# Patient Record
Sex: Male | Born: 1990 | Race: Black or African American | Hispanic: No | Marital: Single | Smoking: Current every day smoker
Health system: Southern US, Community
[De-identification: ages and names within clinical notes are randomized; demographics above are authoritative.]

## PROBLEM LIST (undated history)

## (undated) DIAGNOSIS — Z8719 Personal history of other diseases of the digestive system: Secondary | ICD-10-CM

## (undated) DIAGNOSIS — G47419 Narcolepsy without cataplexy: Secondary | ICD-10-CM

## (undated) DIAGNOSIS — E109 Type 1 diabetes mellitus without complications: Secondary | ICD-10-CM

## (undated) DIAGNOSIS — E119 Type 2 diabetes mellitus without complications: Secondary | ICD-10-CM

## (undated) DIAGNOSIS — R569 Unspecified convulsions: Secondary | ICD-10-CM

## (undated) HISTORY — DX: Type 1 diabetes mellitus without complications: E10.9

## (undated) HISTORY — PX: TONSILLECTOMY: SUR1361

## (undated) HISTORY — PX: CIRCUMCISION: SUR203

## (undated) HISTORY — PX: HERNIA REPAIR: SHX51

---
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
## 2011-04-13 LAB — POCT TOXICOLOGY PANEL

## 2011-04-18 ENCOUNTER — Emergency Department (INDEPENDENT_AMBULATORY_CARE_PROVIDER_SITE_OTHER): Payer: Self-pay

## 2011-04-18 ENCOUNTER — Emergency Department (HOSPITAL_BASED_OUTPATIENT_CLINIC_OR_DEPARTMENT_OTHER)
Admission: EM | Admit: 2011-04-18 | Discharge: 2011-04-18 | Discharge status: Against Medical Advice | Payer: Self-pay | Attending: Emergency Medicine | Admitting: Emergency Medicine

## 2011-04-18 ENCOUNTER — Encounter: Payer: Self-pay | Admitting: *Deleted

## 2011-04-18 DIAGNOSIS — X58XXXA Exposure to other specified factors, initial encounter: Secondary | ICD-10-CM

## 2011-04-18 DIAGNOSIS — M79609 Pain in unspecified limb: Secondary | ICD-10-CM

## 2011-04-18 DIAGNOSIS — S6990XA Unspecified injury of unspecified wrist, hand and finger(s), initial encounter: Secondary | ICD-10-CM

## 2011-04-18 DIAGNOSIS — M25539 Pain in unspecified wrist: Secondary | ICD-10-CM

## 2011-04-18 HISTORY — DX: Unspecified convulsions: R56.9
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
## 2012-05-03 IMAGING — CR DG CHEST 2V
2 series · 2 of 2 positions shown · non-contrast
Comparison: PA and lateral chest 04/25/2009.

CLINICAL DATA: Chest pain.  Hemoptysis.

CHEST - 2 VIEW

[w chest pa]
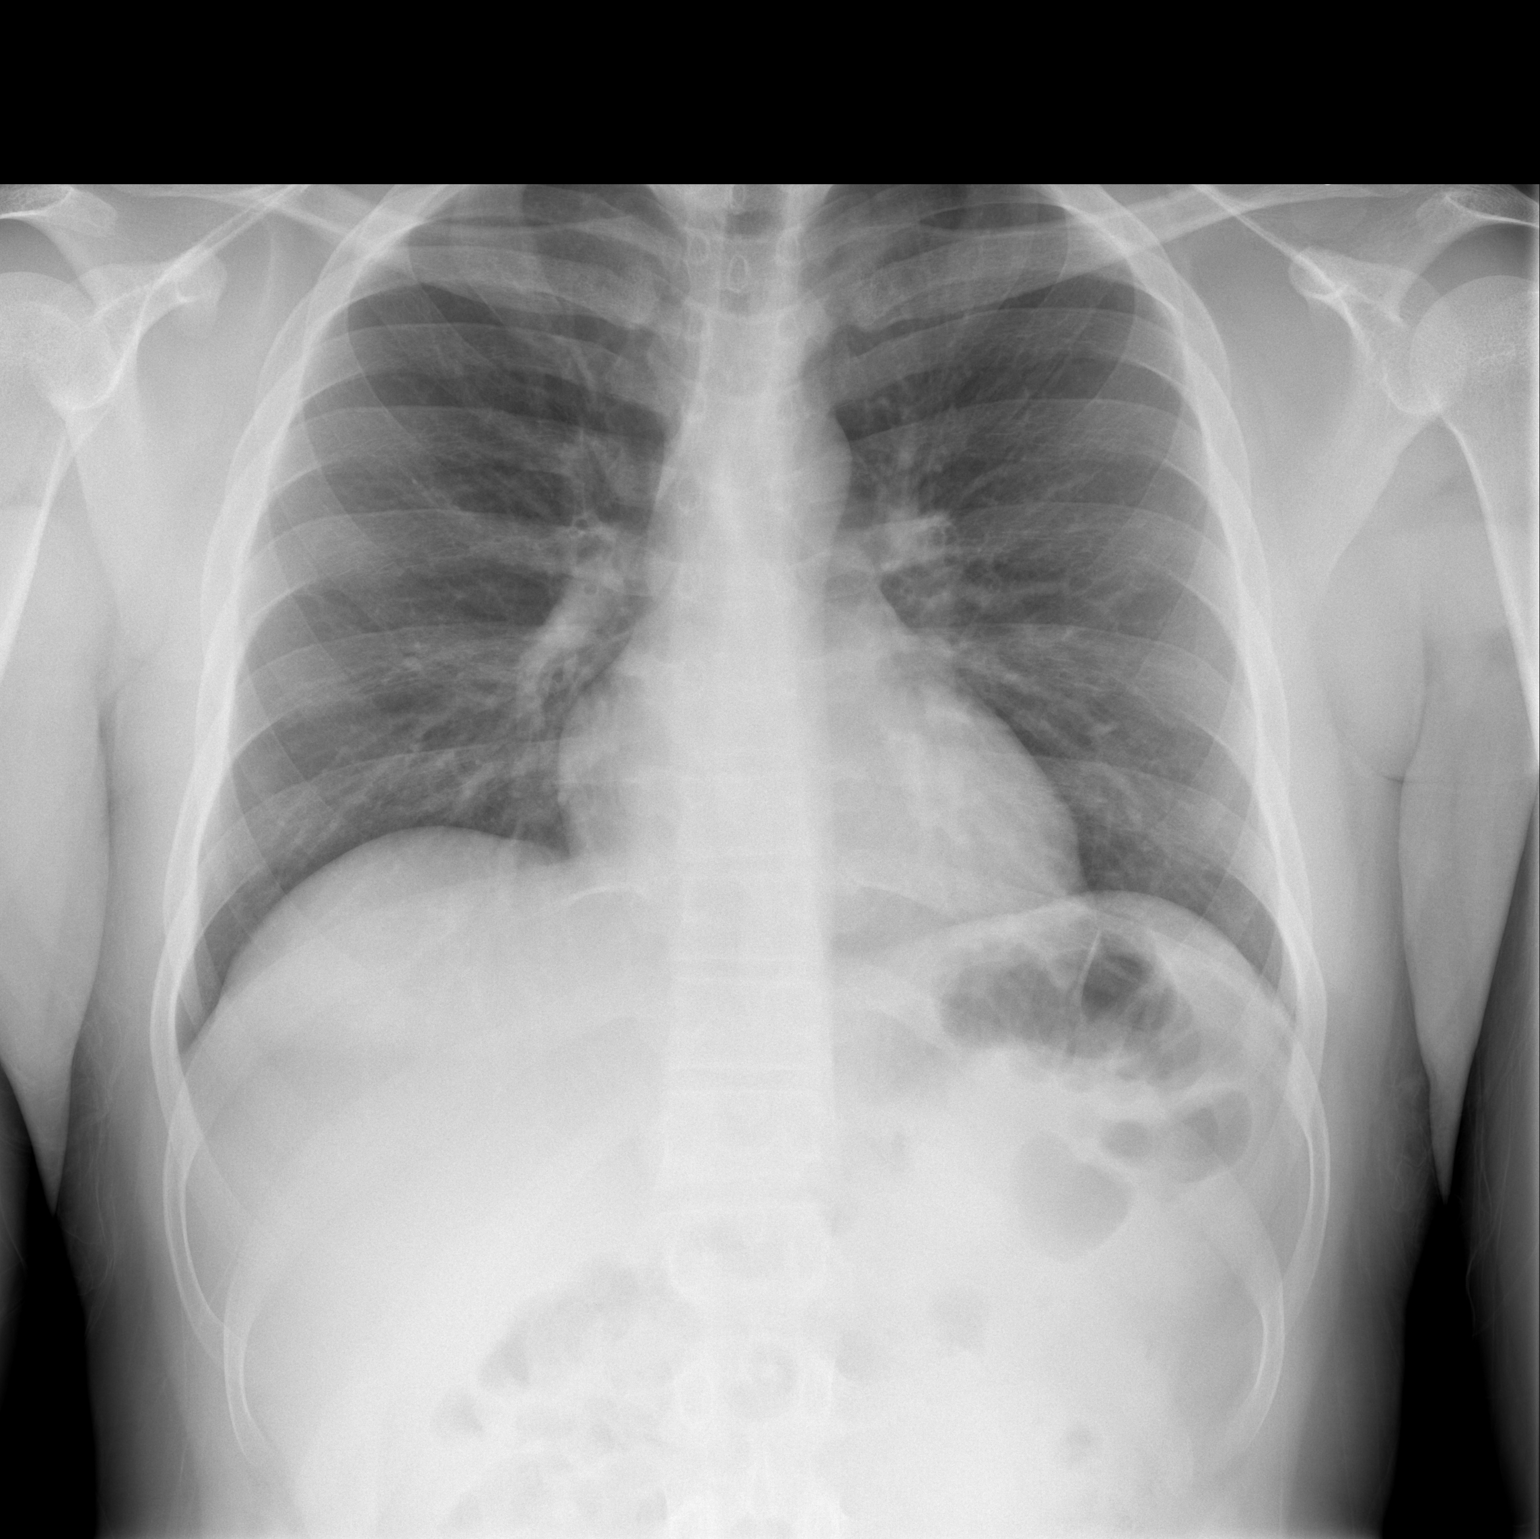

[w chest lat]
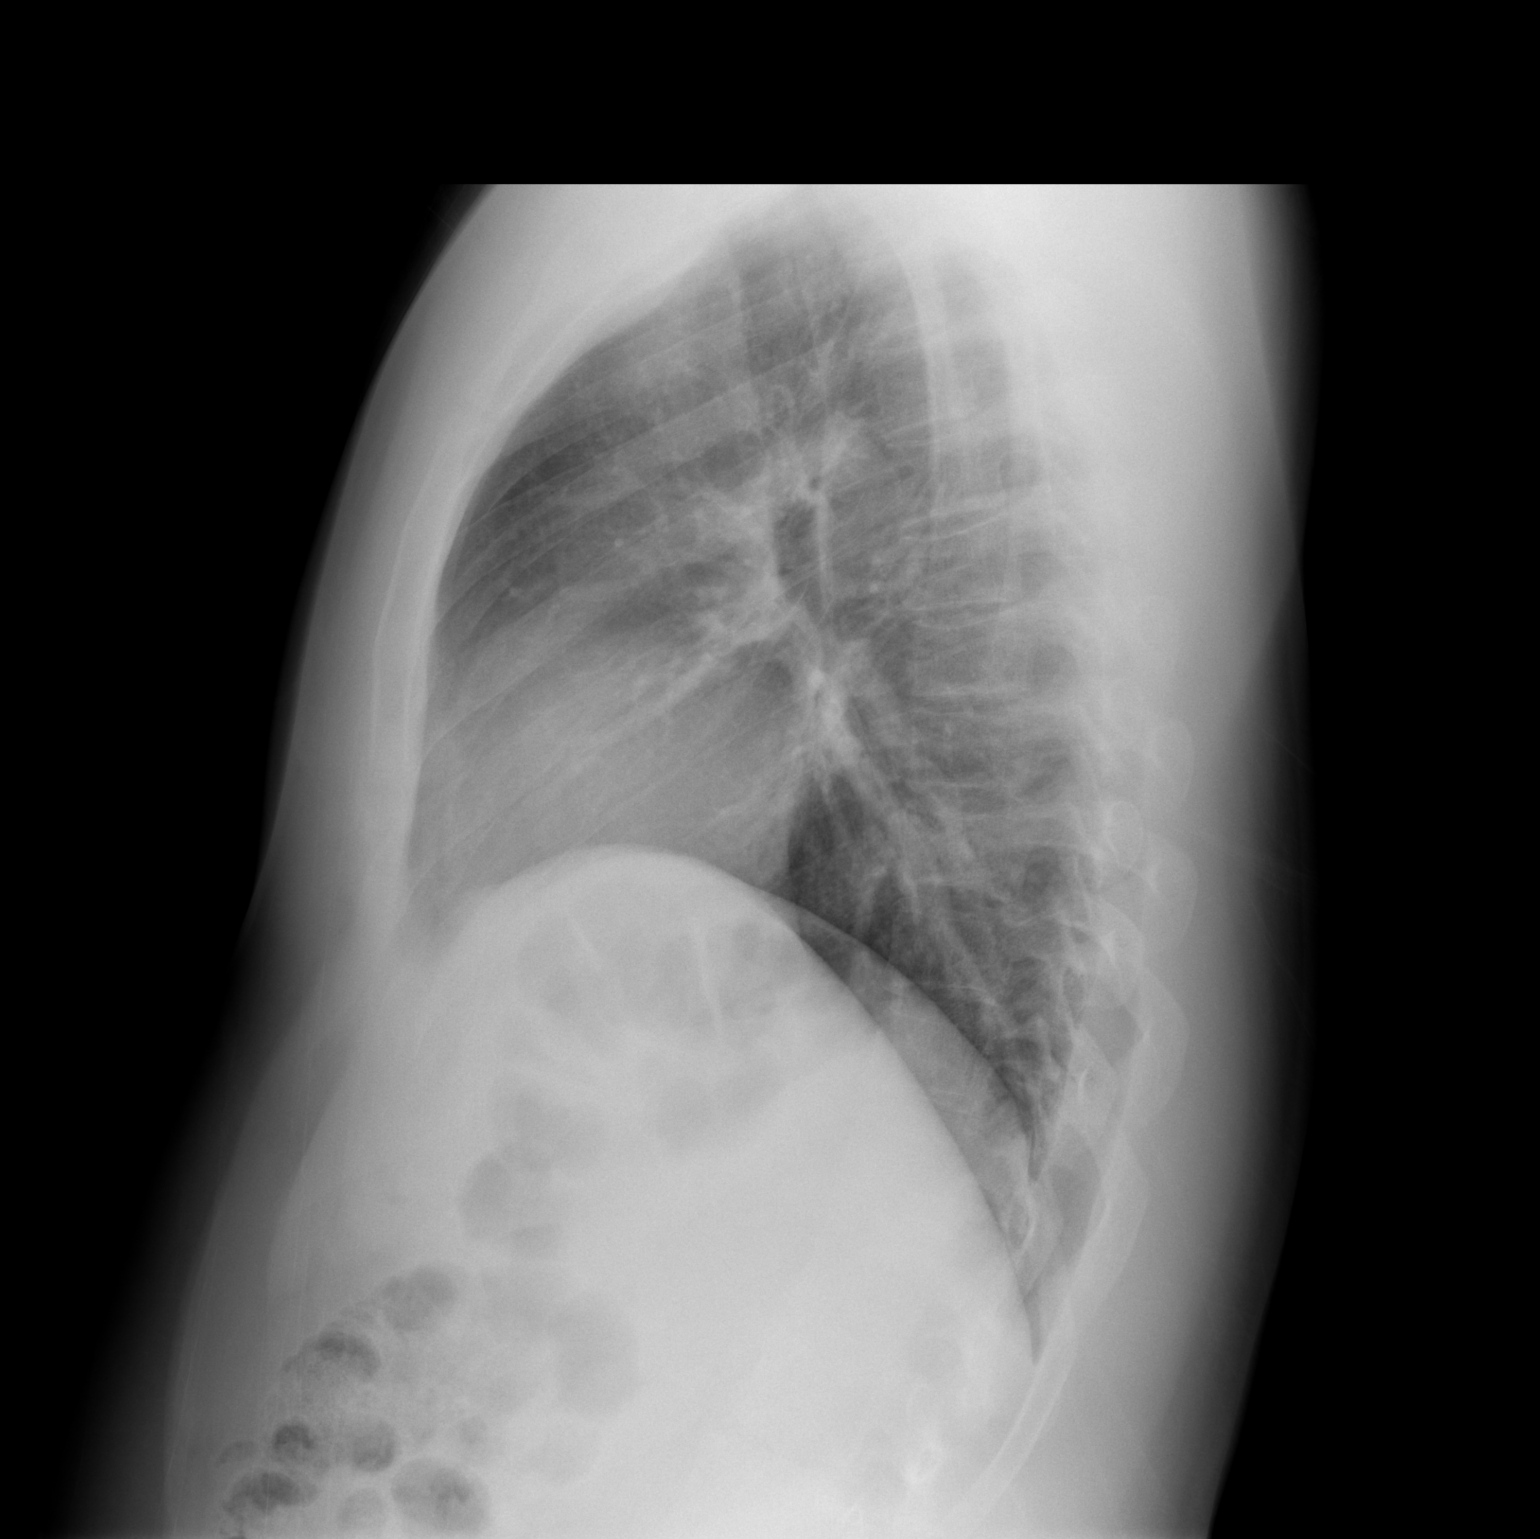

[2 of 2 positions shown; findings below may reference images not displayed]

FINDINGS: Lungs are clear.  Heart size is normal.  No pneumothorax
or pleural effusion.  No focal bony abnormality.
IMPRESSION: No acute disease.

## 2012-05-05 IMAGING — CR DG CHEST 2V
2 series · 2 of 2 positions shown · non-contrast
Comparison: Chest radiograph 11/06/2010

CLINICAL DATA: Sore throat, bronchitis

CHEST - 2 VIEW

[w chest pa]
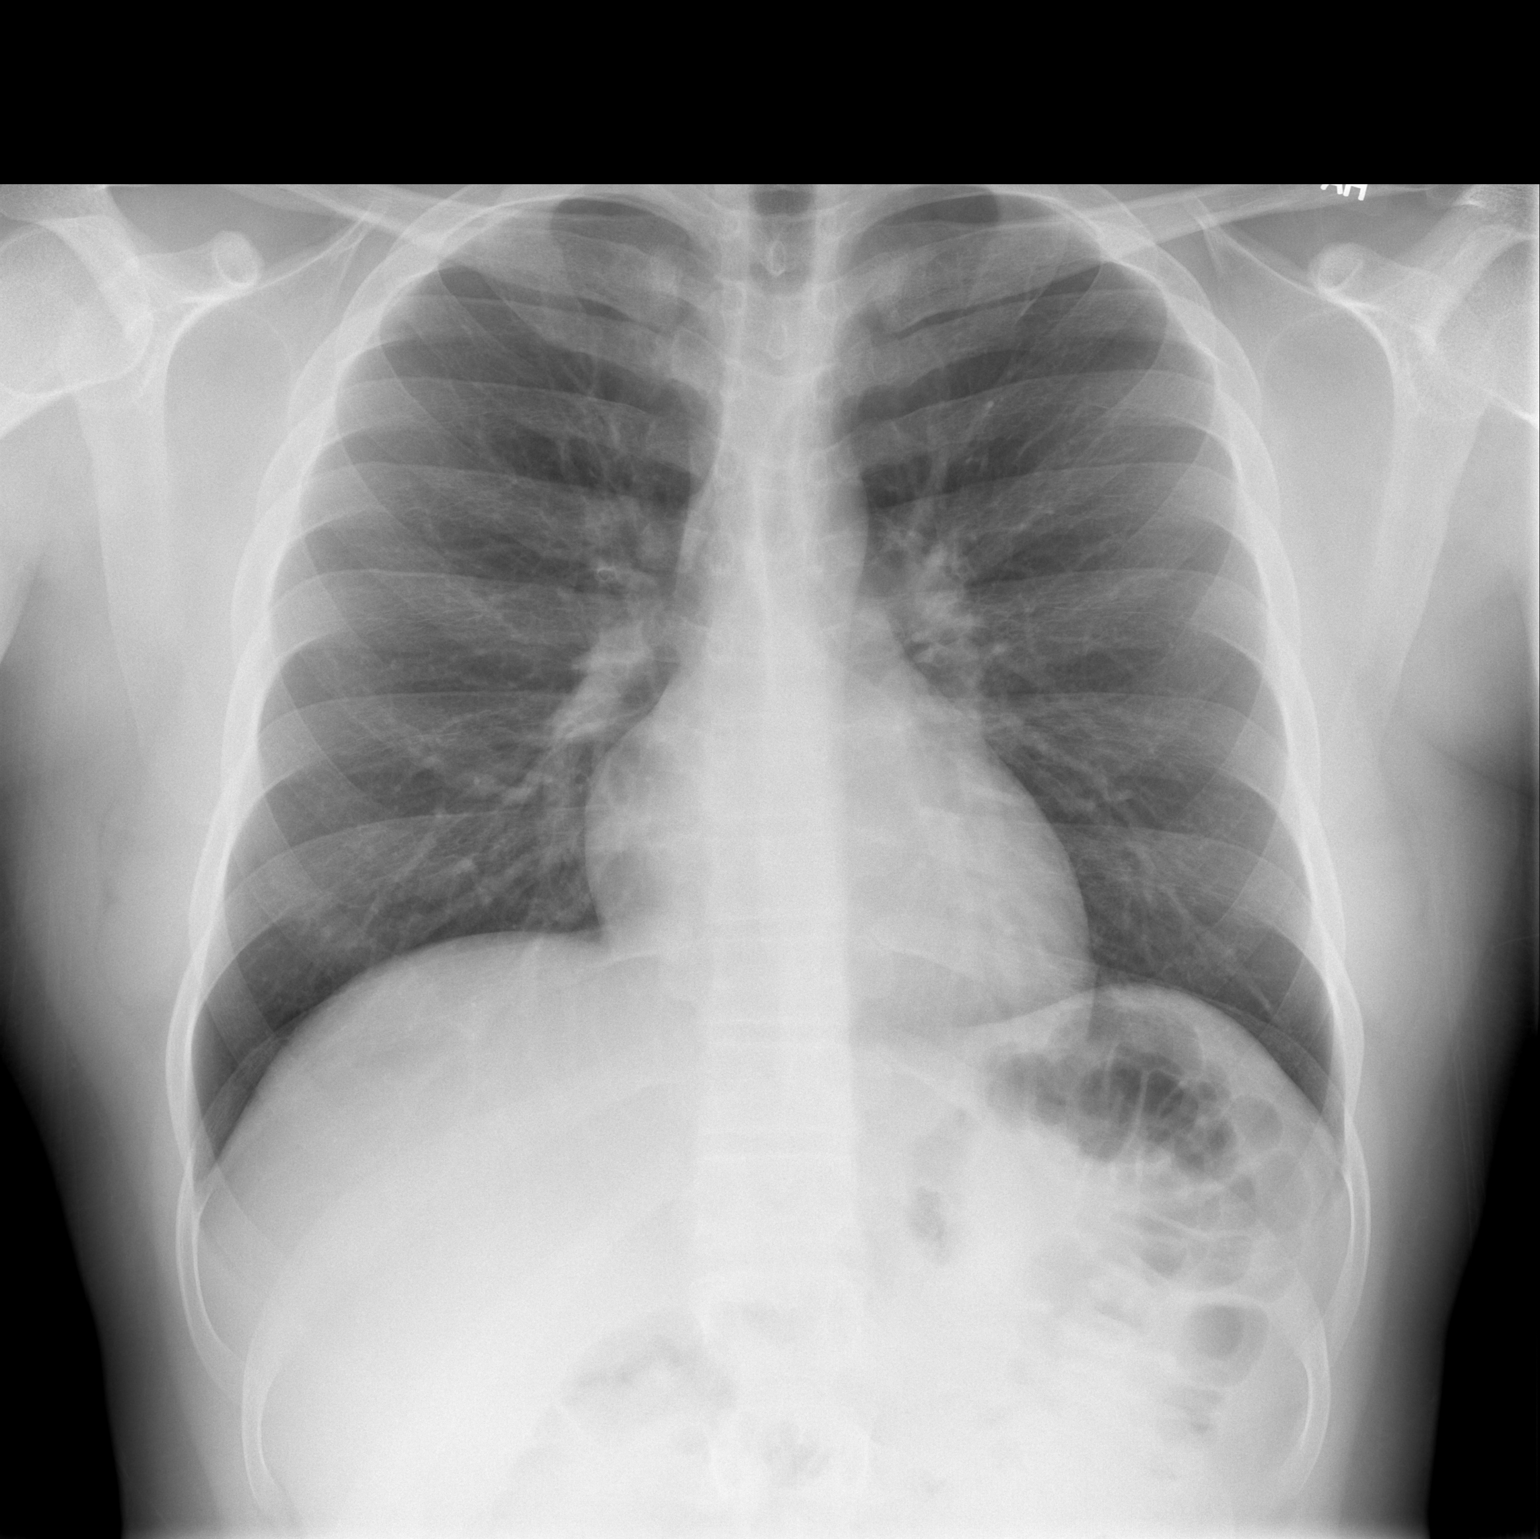

[w chest lat]
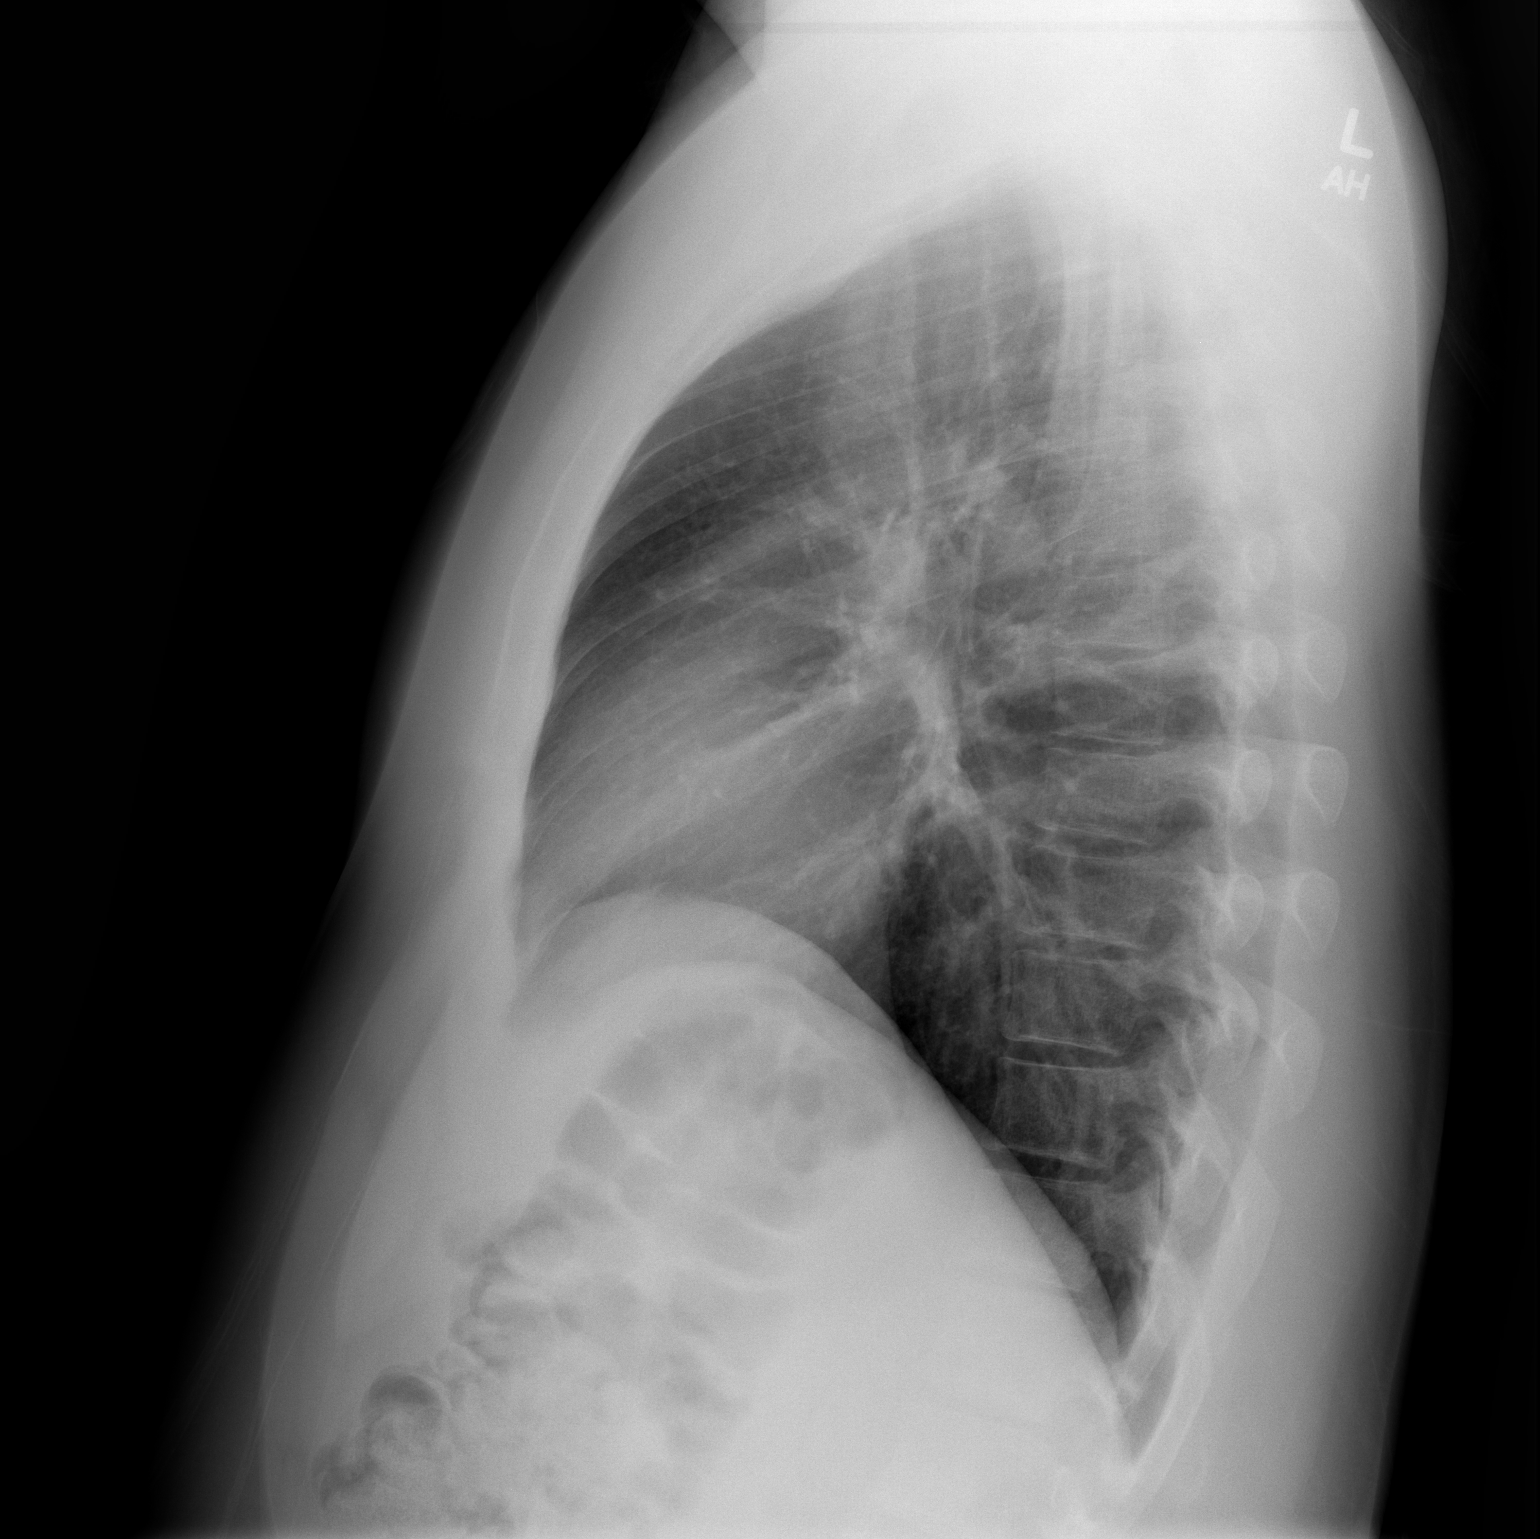

[2 of 2 positions shown; findings below may reference images not displayed]

FINDINGS: Normal mediastinum and heart silhouette.  Costophrenic
angles are clear.  No evidence effusion, infiltrate, or
pneumothorax.  There is mild coarseness of the central
bronchovascular markings which is not significant change from
prior.
IMPRESSION: Mild coarsened central bronchovascular markings suggest viral
process or bronchitis. No significant change.

## 2012-11-17 ENCOUNTER — Emergency Department (HOSPITAL_BASED_OUTPATIENT_CLINIC_OR_DEPARTMENT_OTHER)
Admission: EM | Admit: 2012-11-17 | Discharge: 2012-11-17 | Disposition: A | Discharge status: Home / Self Care | Payer: Self-pay | Attending: Emergency Medicine | Admitting: Emergency Medicine

## 2012-11-17 ENCOUNTER — Encounter (HOSPITAL_BASED_OUTPATIENT_CLINIC_OR_DEPARTMENT_OTHER): Payer: Self-pay | Admitting: Emergency Medicine

## 2012-11-17 DIAGNOSIS — J45909 Unspecified asthma, uncomplicated: Secondary | ICD-10-CM | POA: Insufficient documentation

## 2012-11-17 DIAGNOSIS — R0982 Postnasal drip: Secondary | ICD-10-CM | POA: Insufficient documentation

## 2012-11-17 DIAGNOSIS — Z8669 Personal history of other diseases of the nervous system and sense organs: Secondary | ICD-10-CM | POA: Insufficient documentation

## 2012-11-17 DIAGNOSIS — H579 Unspecified disorder of eye and adnexa: Secondary | ICD-10-CM | POA: Insufficient documentation

## 2012-11-17 DIAGNOSIS — R6889 Other general symptoms and signs: Secondary | ICD-10-CM | POA: Insufficient documentation

## 2012-11-17 DIAGNOSIS — J309 Allergic rhinitis, unspecified: Secondary | ICD-10-CM | POA: Insufficient documentation

## 2012-11-17 DIAGNOSIS — Z79899 Other long term (current) drug therapy: Secondary | ICD-10-CM | POA: Insufficient documentation

## 2012-11-17 DIAGNOSIS — F17200 Nicotine dependence, unspecified, uncomplicated: Secondary | ICD-10-CM | POA: Insufficient documentation

## 2012-11-17 MED ORDER — FEXOFENADINE HCL 60 MG PO TABS: 60.0000 mg | ORAL_TABLET | Freq: Two times a day (BID) | ORAL | Status: DC

## 2012-11-17 MED ORDER — PREDNISONE 10 MG PO TABS: 40.0000 mg | ORAL_TABLET | Freq: Every day | ORAL | Status: DC

## 2012-11-17 NOTE — ED Provider Notes (Signed)
History     CSN: [id number]  Arrival date & time 11/17/12  1925   First MD Initiated Contact with Patient 11/17/12 1958      Chief Complaint  Patient presents with  . Allergies    (Consider location/radiation/quality/duration/timing/severity/associated sxs/prior treatment) HPI Comments: Patient presents with a one-week history of runny nose and sinus congestion. He states she's been sneezing a lot and his throat feels itchy. He also has itchy eyes. He has some sinus pressure in his face. He denies any nausea or vomiting. He denies any cough or chest congestion. He denies any fevers or chills. He took one dose of clear and that someone gave him today but hasn't taken any other medications for the symptoms. He denies a past history of seasonal allergies.   Past Medical History  Diagnosis Date  . Asthma   . Seizures     Past Surgical History  Procedure Laterality Date  . Hernia repair    . Tonsillectomy    . Circumcision      History reviewed. No pertinent family history.  History  Substance Use Topics  . Smoking status: Current Every Day Smoker -- 0.00 packs/day  . Smokeless tobacco: Not on file  . Alcohol Use: No      Review of Systems  Constitutional: Negative for fever, chills, diaphoresis and fatigue.  HENT: Positive for congestion, rhinorrhea, sneezing and postnasal drip. Negative for sore throat, trouble swallowing and voice change.   Eyes: Positive for itching. Negative for pain, discharge, redness and visual disturbance.  Respiratory: Negative for cough, chest tightness and shortness of breath.   Cardiovascular: Negative for chest pain and leg swelling.  Gastrointestinal: Negative for nausea, vomiting, abdominal pain, diarrhea and blood in stool.  Genitourinary: Negative for frequency, hematuria, flank pain and difficulty urinating.  Musculoskeletal: Negative for back pain and arthralgias.  Skin: Negative for rash.  Neurological: Negative for dizziness, speech  difficulty, weakness, numbness and headaches.    Allergies  Robitussin  Home Medications   Current Outpatient Rx  Name  Route  Sig  Dispense  Refill  . albuterol (PROVENTIL) (2.5 MG/3ML) 0.083% nebulizer solution   Nebulization   Take 2.5 mg by nebulization every 6 (six) hours as needed. For bronchitis          . fexofenadine (ALLEGRA) 60 MG tablet   Oral   Take 1 tablet (60 mg total) by mouth 2 (two) times daily.   30 tablet   0   . predniSONE (DELTASONE) 10 MG tablet   Oral   Take 4 tablets (40 mg total) by mouth daily.   20 tablet   0     BP 140/83  Pulse 97  Temp(Src) 99.4 F (37.4 C) (Oral)  Resp 16  Ht 5\' 11"  (1.803 m)  Wt 235 lb (106.595 kg)  BMI 32.79 kg/m2  SpO2 96%  Physical Exam  Constitutional: He is oriented to person, place, and time. He appears well-developed and well-nourished.  HENT:  Head: Normocephalic and atraumatic.  Right Ear: External ear normal.  Left Ear: External ear normal.  Mouth/Throat: Oropharynx is clear and moist.  Eyes: Pupils are equal, round, and reactive to light.  Neck: Normal range of motion. Neck supple.  Cardiovascular: Normal rate, regular rhythm and normal heart sounds.   Pulmonary/Chest: Effort normal and breath sounds normal. No respiratory distress. He has no wheezes. He has no rales. He exhibits no tenderness.  Abdominal: Soft. Bowel sounds are normal. There is no tenderness. There is no  rebound and no guarding.  Musculoskeletal: Normal range of motion. He exhibits no edema.  Lymphadenopathy:    He has no cervical adenopathy.  Neurological: He is alert and oriented to person, place, and time.  Skin: Skin is warm and dry. No rash noted.  Psychiatric: He has a normal mood and affect.    ED Course  Procedures (including critical care time)  Labs Reviewed - No data to display No results found.   1. Allergic rhinitis       MDM  Patient with likely seasonal allergies. I gave her a prescription for Allegra  and a five-day course of prednisone. He did ask me about his nose. He has a previously fractured nose several years ago and since then he feels like it's crooked and he's had problems breathing out of his left nares. I will give him a referral to ENT regarding this.        Rolan Bucco, MD 11/17/12 2036

## 2012-11-17 NOTE — ED Notes (Signed)
Onset runny nose, itchy throat, itchy eyes beginning of this week. Pt states feels like something in eyes. Took Claritin, didn't help. Never had to take medicine for allergies in past.

## 2013-01-09 ENCOUNTER — Emergency Department (HOSPITAL_BASED_OUTPATIENT_CLINIC_OR_DEPARTMENT_OTHER): Payer: Self-pay

## 2013-01-09 ENCOUNTER — Emergency Department (HOSPITAL_BASED_OUTPATIENT_CLINIC_OR_DEPARTMENT_OTHER)
Admission: EM | Admit: 2013-01-09 | Discharge: 2013-01-09 | Disposition: A | Discharge status: Home / Self Care | Payer: Self-pay | Attending: Emergency Medicine | Admitting: Emergency Medicine

## 2013-01-09 ENCOUNTER — Encounter (HOSPITAL_BASED_OUTPATIENT_CLINIC_OR_DEPARTMENT_OTHER): Payer: Self-pay

## 2013-01-09 DIAGNOSIS — F17200 Nicotine dependence, unspecified, uncomplicated: Secondary | ICD-10-CM | POA: Insufficient documentation

## 2013-01-09 DIAGNOSIS — J45909 Unspecified asthma, uncomplicated: Secondary | ICD-10-CM | POA: Insufficient documentation

## 2013-01-09 DIAGNOSIS — J029 Acute pharyngitis, unspecified: Secondary | ICD-10-CM | POA: Insufficient documentation

## 2013-01-09 DIAGNOSIS — H5710 Ocular pain, unspecified eye: Secondary | ICD-10-CM | POA: Insufficient documentation

## 2013-01-09 DIAGNOSIS — R51 Headache: Secondary | ICD-10-CM | POA: Insufficient documentation

## 2013-01-09 DIAGNOSIS — R110 Nausea: Secondary | ICD-10-CM | POA: Insufficient documentation

## 2013-01-09 DIAGNOSIS — Z8669 Personal history of other diseases of the nervous system and sense organs: Secondary | ICD-10-CM | POA: Insufficient documentation

## 2013-01-09 MED ORDER — ONDANSETRON HCL 4 MG/2ML IJ SOLN
4.0000 mg | Freq: Once | INTRAMUSCULAR | Status: AC
  Administered 2013-01-09: 4 mg via INTRAVENOUS
  Filled 2013-01-09: qty 2

## 2013-01-09 MED ORDER — KETOROLAC TROMETHAMINE 30 MG/ML IJ SOLN
30.0000 mg | Freq: Once | INTRAMUSCULAR | Status: AC
  Administered 2013-01-09: 30 mg via INTRAVENOUS
  Filled 2013-01-09: qty 1

## 2013-01-09 MED ORDER — DIPHENHYDRAMINE HCL 50 MG/ML IJ SOLN
25.0000 mg | Freq: Once | INTRAMUSCULAR | Status: AC
  Administered 2013-01-09: 25 mg via INTRAVENOUS
  Filled 2013-01-09: qty 1

## 2013-01-09 MED ORDER — SODIUM CHLORIDE 0.9 % IV BOLUS (SEPSIS)
1000.0000 mL | Freq: Once | INTRAVENOUS | Status: AC
  Administered 2013-01-09: 1000 mL via INTRAVENOUS

## 2013-01-09 MED ORDER — METOCLOPRAMIDE HCL 5 MG/ML IJ SOLN
10.0000 mg | Freq: Once | INTRAMUSCULAR | Status: AC
  Administered 2013-01-09: 10 mg via INTRAVENOUS
  Filled 2013-01-09: qty 2

## 2013-01-09 NOTE — ED Notes (Signed)
C/o HA x 3 days 

## 2013-01-09 NOTE — ED Provider Notes (Signed)
History  This chart was scribed for Glynn Octave, MD by Ardelia Mems, ED Scribe. This patient was seen in room MH08/MH08 and the patient's care was started at 9:57 PM.  CSN: [id number]  Arrival date & time 01/09/13  2052    Chief Complaint  Patient presents with  . Headache    The history is provided by the patient. No language interpreter was used.   HPI Comments: Jerome Murphy is a 22 y.o. Male with a hx of asthma and seizures who presents to the Emergency Department complaining of gradual onset, gradually worsening, constant, moderate generalized headache of 3 days duration. Pt states that when he stands up his pain becomes worse and he reports feeling pressure in his head and face. Pt reports associated sore throat and eye pain, but denies visual disturbance. Pt states that he feels like he was punched in the face. Pt denies recent injury or falls. Pt states that he has a hx of seizures, but that he is not compliant with his seizure medication and he hasn't had a seizure in years. Pt states that he does not get headaches often. Pt states that he has taken Aleve Migraine without relief. Pt states that he was here about a month ago with sore throat and allergy symptoms. Pt denies fever, rhinorrhea, vomiting, weakness, numbness, tingling or any other symptoms.   PCP- None  Past Medical History  Diagnosis Date  . Asthma   . Seizures    Past Surgical History  Procedure Laterality Date  . Hernia repair    . Tonsillectomy    . Circumcision     No family history on file. History  Substance Use Topics  . Smoking status: Current Every Day Smoker -- 0.00 packs/day  . Smokeless tobacco: Not on file  . Alcohol Use: No    Review of Systems  Constitutional: Negative for fever and chills.  HENT: Positive for sore throat.   Eyes: Positive for pain. Negative for visual disturbance.  Respiratory: Negative for cough and shortness of breath.   Cardiovascular: Negative for chest  pain.  Gastrointestinal: Negative for nausea, vomiting, abdominal pain and diarrhea.  Genitourinary: Negative for dysuria.  Musculoskeletal: Negative for back pain.  Skin: Negative for rash.  Neurological: Positive for headaches.  Psychiatric/Behavioral: Negative for confusion.  A complete 10 system review of systems was obtained and all systems are negative except as noted in the HPI and PMH.   Allergies  Robitussin  Home Medications   Current Outpatient Rx  Name  Route  Sig  Dispense  Refill  . albuterol (PROVENTIL) (2.5 MG/3ML) 0.083% nebulizer solution   Nebulization   Take 2.5 mg by nebulization every 6 (six) hours as needed. For bronchitis          . fexofenadine (ALLEGRA) 60 MG tablet   Oral   Take 1 tablet (60 mg total) by mouth 2 (two) times daily.   30 tablet   0   . predniSONE (DELTASONE) 10 MG tablet   Oral   Take 4 tablets (40 mg total) by mouth daily.   20 tablet   0    Triage Vitals: BP 134/79  Pulse 94  Temp(Src) 98.1 F (36.7 C) (Oral)  Resp 20  Ht 5\' 11"  (1.803 m)  Wt 230 lb (104.327 kg)  BMI 32.09 kg/m2  SpO2 100%  Physical Exam  Nursing note and vitals reviewed. Constitutional: He is oriented to person, place, and time. He appears well-developed and well-nourished. No distress.  HENT:  Head: Normocephalic and atraumatic.  Mouth/Throat: Oropharynx is clear and moist. No oropharyngeal exudate.  No meningismus. No sinus tenderness.  Eyes: EOM are normal.  Neck: Normal range of motion. Neck supple. No tracheal deviation present.  Cardiovascular: Normal rate.   Pulmonary/Chest: Effort normal. No respiratory distress.  Abdominal: There is no tenderness. There is no rebound and no guarding.  Musculoskeletal: Normal range of motion. He exhibits no edema and no tenderness.  Neurological: He is alert and oriented to person, place, and time. No cranial nerve deficit. He exhibits normal muscle tone. Coordination normal.  No ataxia on finger to nose,  5/5 strength throughout. CN 2-12 intact, no ataxia on finger to nose, no nystagmus, 5/5 strength throughout, no pronator drift, Romberg negative, normal gait.   Skin: Skin is warm and dry.  Psychiatric: He has a normal mood and affect. His behavior is normal.    ED Course  Procedures (including critical care time)  DIAGNOSTIC STUDIES: Oxygen Saturation is 100% on RA, normal by my interpretation.    COORDINATION OF CARE: 10:02 PM- Pt advised of plan for treatment and pt agrees.  Medications  sodium chloride 0.9 % bolus 1,000 mL (1,000 mLs Intravenous New Bag/Given 01/09/13 2208)  ondansetron (ZOFRAN) injection 4 mg (4 mg Intravenous Given 01/09/13 2209)  ketorolac (TORADOL) 30 MG/ML injection 30 mg (30 mg Intravenous Given 01/09/13 2209)  metoCLOPramide (REGLAN) injection 10 mg (10 mg Intravenous Given 01/09/13 2209)  diphenhydrAMINE (BENADRYL) injection 25 mg (25 mg Intravenous Given 01/09/13 2209)    Labs Reviewed - No data to display  Ct Head Wo Contrast  01/09/2013   *RADIOLOGY REPORT*  Clinical Data: Severe headache  CT HEAD WITHOUT CONTRAST  Technique:  Contiguous axial images were obtained from the base of the skull through the vertex without contrast.  Comparison: None.  Findings: There is no acute intracranial hemorrhage or infarct.  No midline shift or mass lesion.  CSF containing spaces are normal. No extra-axial fluid collection.  Calvarium is intact.  Scattered mucosal thickening is present within the ethmoidal air cells.  Mastoid air cells well pneumatized.  IMPRESSION:  Normal head CT.  No acute intracranial process.   Original Report Authenticated By: Rise Mu, M.D.    No diagnosis found.  MDM  Patient complains of gradual onset frontal headache for the past 3 days. Associated with nausea. It is worse with bending forward. Denies any trauma. Remote history of seizures as a child.  Nonfocal neurological exam. No red flags. Not sudden onset. No meningismus, no  neuro deficits. Doubt SAH or meningitis. Patient is given IV fluids, Toradol, Reglan, Benadryl  Pain has resolved with headache cocktail. Feels better.  Will be referred to neurology.        I personally performed the services described in this documentation, which was scribed in my presence. The recorded information has been reviewed and is accurate.    Glynn Octave, MD 01/09/13 2300

## 2013-09-28 ENCOUNTER — Emergency Department (HOSPITAL_BASED_OUTPATIENT_CLINIC_OR_DEPARTMENT_OTHER)
Admission: EM | Admit: 2013-09-28 | Discharge: 2013-09-28 | Disposition: A | Discharge status: Home / Self Care | Payer: No Typology Code available for payment source | Attending: Emergency Medicine | Admitting: Emergency Medicine

## 2013-09-28 ENCOUNTER — Encounter (HOSPITAL_BASED_OUTPATIENT_CLINIC_OR_DEPARTMENT_OTHER): Payer: Self-pay | Admitting: Emergency Medicine

## 2013-09-28 DIAGNOSIS — Y9389 Activity, other specified: Secondary | ICD-10-CM | POA: Insufficient documentation

## 2013-09-28 DIAGNOSIS — IMO0002 Reserved for concepts with insufficient information to code with codable children: Secondary | ICD-10-CM | POA: Insufficient documentation

## 2013-09-28 DIAGNOSIS — G40909 Epilepsy, unspecified, not intractable, without status epilepticus: Secondary | ICD-10-CM | POA: Insufficient documentation

## 2013-09-28 DIAGNOSIS — J45909 Unspecified asthma, uncomplicated: Secondary | ICD-10-CM | POA: Insufficient documentation

## 2013-09-28 DIAGNOSIS — Y92410 Unspecified street and highway as the place of occurrence of the external cause: Secondary | ICD-10-CM | POA: Insufficient documentation

## 2013-09-28 DIAGNOSIS — Z79899 Other long term (current) drug therapy: Secondary | ICD-10-CM | POA: Insufficient documentation

## 2013-09-28 DIAGNOSIS — F17200 Nicotine dependence, unspecified, uncomplicated: Secondary | ICD-10-CM | POA: Insufficient documentation

## 2013-09-28 MED ORDER — IBUPROFEN 600 MG PO TABS: 600.0000 mg | ORAL_TABLET | Freq: Three times a day (TID) | ORAL | Status: AC

## 2013-09-28 MED ORDER — DIAZEPAM 5 MG PO TABS
5.0000 mg | ORAL_TABLET | Freq: Two times a day (BID) | ORAL | Status: AC
Start: 2013-09-28 — End: 2013-10-02

## 2013-09-28 MED ORDER — TRAMADOL HCL 50 MG PO TABS: 50.0000 mg | ORAL_TABLET | Freq: Four times a day (QID) | ORAL | Status: DC | PRN
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
## 2015-02-15 ENCOUNTER — Emergency Department (HOSPITAL_BASED_OUTPATIENT_CLINIC_OR_DEPARTMENT_OTHER)
Admission: EM | Admit: 2015-02-15 | Discharge: 2015-02-15 | Disposition: A | Discharge status: Home / Self Care | Payer: No Typology Code available for payment source | Attending: Emergency Medicine | Admitting: Emergency Medicine

## 2015-02-15 ENCOUNTER — Encounter (HOSPITAL_BASED_OUTPATIENT_CLINIC_OR_DEPARTMENT_OTHER): Payer: Self-pay | Admitting: *Deleted

## 2015-02-15 DIAGNOSIS — R51 Headache: Secondary | ICD-10-CM | POA: Diagnosis present

## 2015-02-15 DIAGNOSIS — J45909 Unspecified asthma, uncomplicated: Secondary | ICD-10-CM | POA: Diagnosis not present

## 2015-02-15 DIAGNOSIS — Z720 Tobacco use: Secondary | ICD-10-CM | POA: Insufficient documentation

## 2015-02-15 DIAGNOSIS — R42 Dizziness and giddiness: Secondary | ICD-10-CM | POA: Diagnosis not present

## 2015-02-15 LAB — CBC WITH DIFFERENTIAL/PLATELET
BASOS ABS: 0 10*3/uL (ref 0.0–0.1)
BASOS PCT: 0 % (ref 0–1)
EOS PCT: 11 % — AB (ref 0–5)
Eosinophils Absolute: 0.6 10*3/uL (ref 0.0–0.7)
HEMATOCRIT: 42.4 % (ref 39.0–52.0)
HEMOGLOBIN: 14.7 g/dL (ref 13.0–17.0)
Lymphocytes Relative: 44 % (ref 12–46)
Lymphs Abs: 2.3 10*3/uL (ref 0.7–4.0)
MCH: 31.5 pg (ref 26.0–34.0)
MCHC: 34.7 g/dL (ref 30.0–36.0)
MCV: 91 fL (ref 78.0–100.0)
MONO ABS: 0.4 10*3/uL (ref 0.1–1.0)
MONOS PCT: 8 % (ref 3–12)
NEUTROS ABS: 1.9 10*3/uL (ref 1.7–7.7)
Neutrophils Relative %: 37 % — ABNORMAL LOW (ref 43–77)
Platelets: 194 10*3/uL (ref 150–400)
RBC: 4.66 MIL/uL (ref 4.22–5.81)
RDW: 12 % (ref 11.5–15.5)
WBC: 5.1 10*3/uL (ref 4.0–10.5)

## 2015-02-15 LAB — BASIC METABOLIC PANEL WITH GFR
Anion gap: 8 (ref 5–15)
BUN: 9 mg/dL (ref 6–20)
CO2: 25 mmol/L (ref 22–32)
Calcium: 9.4 mg/dL (ref 8.9–10.3)
Chloride: 106 mmol/L (ref 101–111)
Creatinine, Ser: 0.92 mg/dL (ref 0.61–1.24)
GFR calc Af Amer: >60 mL/min
GFR calc non Af Amer: >60 mL/min
Glucose, Bld: 107 mg/dL — ABNORMAL HIGH (ref 65–99)
Potassium: 4.3 mmol/L (ref 3.5–5.1)
Sodium: 139 mmol/L (ref 135–145)

## 2015-02-15 LAB — MAGNESIUM: Magnesium: 1.8 mg/dL (ref 1.7–2.4)

## 2015-02-15 MED ORDER — SODIUM CHLORIDE 0.9 % IV BOLUS (SEPSIS)
1000.0000 mL | Freq: Once | INTRAVENOUS | Status: AC
  Administered 2015-02-15: 1000 mL via INTRAVENOUS

## 2015-02-15 NOTE — Discharge Instructions (Signed)
Emergency Department Resource Guide 1) Find a Doctor and Pay Out of Pocket Although you won't have to find out who is covered by your insurance plan, it is a good idea to ask around and get recommendations. You will then need to call the office and see if the doctor you have chosen will accept you as a new patient and what types of options they offer for patients who are self-pay. Some doctors offer discounts or will set up payment plans for their patients who do not have insurance, but you will need to ask so you aren't surprised when you get to your appointment.  2) Contact Your Local Health Department Not all health departments have doctors that can see patients for sick visits, but many do, so it is worth a call to see if yours does. If you don't know where your local health department is, you can check in your phone book. The CDC also has a tool to help you locate your state's health department, and many state websites also have listings of all of their local health departments.  3) Find a [facility] If your illness is not likely to be very severe or complicated, you may want to try a walk in clinic. These are popping up all over the country in pharmacies, drugstores, and shopping centers. They're usually staffed by nurse practitioners or physician assistants that have been trained to treat common illnesses and complaints. They're usually fairly quick and inexpensive. However, if you have serious medical issues or chronic medical problems, these are probably not your best option.   Chronic Pain Problems: Organization         Address     Phone             Notes  [facility]  [phone] Patients need to be referred by their primary care doctor.   Medication Assistance: Organization         Address     Phone             Notes  [facility] Medication [facility] [address] [place], [place] [address] [phone] --Must be a resident of  [facility] -- Must have NO insurance coverage whatsoever (no Medicaid/ Medicare, etc.) -- The pt. MUST have a primary care doctor that directs their care regularly and follows them in the community   MedAssist  [phone]   [employer]  [phone]    Agencies that provide inexpensive medical care: Organization         Address     Phone             Notes  [place]  [phone]   [facility]    [phone]   [facility] [place], [place] [address] [phone]   [place] [address] [place] [phone]   Planned Parenthood    [phone]   [facility]    [phone]   [place] and [address], [place] Phone:  [phone], Fax:  [phone] Hours of Operation:  9 am - 6 pm, M-F.  Also accepts Medicaid/Medicare and self-pay.  [facility] for [place], [address], [place] Phone: [phone], Fax: [phone]. Hours of Operation:  8:30 am - 5:30 pm, M-F.  Also accepts Medicaid and self-pay.  [facility] [place] [address] [place] Phone: [phone]   [place], [place], [place] [phone], Ext. [phone] Mondays & Thursdays: 7-9 AM.  First 15 patients are seen on a first come, first serve basis.   [facility] [address] [place] [address] [phone] Accepts Medicaid   [place] Providers:  Organization         Address     Phone             Notes  [facility] [address], [place] [phone] Also accepts self-pay patients.  [facility] [id number] [place], [place]  [phone]   [place], [address], [place] [phone]   [facility]  [address], [place] [phone]   Lucianne Lei [address], [place]   [phone] Only accepts [place] Access [place] patients after they have their name applied to their card.   Self-Pay (no insurance) in [facility]:  Organization         Address     Phone             Notes  Sickle Cell Patients, [facility] [place] [phone]   [facility] [place] [phone]   [facility] [place]  [place], [place], [place] [phone]   [facility]/Dr. Osei-Bonsu  [address] [place] or [address], [place] [phone] Phone number for both [place] and [place] locations is the same.  Urgent Medical and [facility] [address] [place] [phone]   [facility] [address], [place] or [address] [phone]   [facility] [address] [place] [phone], phone; [phone], fax Sees patients 1st and 3rd Saturday of every month.  Must not qualify for public or private insurance (i.e. Medicaid, Medicare, [place] Health Choice, Veterans' Benefits)  Household income should be no more than 200% of the poverty level The clinic cannot treat you if you are pregnant or think you are pregnant  Sexually transmitted diseases are not treated at the clinic.    Dental Care:  Organization         Address     Phone             Notes  [facility] Department of [facility] [phone] Accepts children up to age 37 who are enrolled in [place] or [place]; pregnant women with a Medicaid card; and children who have applied for Medicaid or [place] Health Choice, but were declined, whose parents can pay a reduced fee at time of service.  [facility] Department of [facility]  [address], [place] [phone] Accepts children up to age 36 who are enrolled in [place] or [place]; pregnant women with a Medicaid card; and children who have applied for Medicaid or [place] Health Choice, but were declined, whose parents can pay a reduced fee at time of service.  [facility] Adult [facility] Access PROGRAM  [place] [phone] Patients are seen by  appointment only. Walk-ins are not accepted. [place] will see patients 61 years of age and older. Monday - Tuesday (8am-5pm) Most Wednesdays (8:30-5pm) $30 per visit, cash only  [facility] Adult Dental Access PROGRAM  [address], [facility] [phone] Patients are seen by appointment only. Walk-ins are not accepted. [place] will see patients 46 years of age and older. One Wednesday Evening (Monthly: Volunteer Based).  $30 per visit, cash only  [place]  [phone] for adults; Children under age 22, call Graduate Pediatric Dentistry at [phone]. Children aged 70-14, please call [phone] to request a pediatric application.  Dental services are provided in all areas of dental care including fillings, crowns and bridges, complete and partial dentures, implants, gum treatment, root canals, and extractions. Preventive care is also provided. Treatment is provided to both adults and children. Patients are selected via a lottery and there is often a waiting list.   [facility] [address] [place]  [phone] [email or web address]   [facility] [address] [place], [place] [phone], Ext. [phone] Second and Fourth Thursday of each month, opens at 6:30 AM; Clinic ends at 9 AM.  Patients are seen on a first-come first-served basis, and a limited number are seen during each clinic.   [facility]  [address] [place], [place] [phone]   Eligibility Requirements You  must have lived in [place], [place], or [place] counties for at least the last three months.   You cannot be eligible for state or federal sponsored [employer], including [employer], [place], or [employer].   You generally cannot be eligible for healthcare insurance through your employer.    How to apply: Eligibility screenings are held every Tuesday and Wednesday afternoon from 1:00 pm until 4:00 pm. You do not need an appointment for the interview!  [facility] [address], [place], [place]   [place]  [place] Department  [place]  [phone]    Behavioral Health Resources in the Community: Intensive Outpatient Programs Organization         Address     Phone             Notes  [place]. [address] [place], [place] [phone]   [facility] Outpatient [address], [place], [place]   ADS: Alcohol & Drug Svcs [address] [place], [place]   [place] [address]  [place], [place] or [phone]     Substance Abuse Resources Organization         Address     Phone             Notes  Alcohol and Drug Services  [phone]   [place]  [phone]   [facility] [place]   Chinita Pester  [phone]   Residential & Outpatient Substance Abuse Program  [phone]   Psychological Services Organization         Address     Phone             Notes  [facility] [place]  [phone]   [place] [address] [place] or [phone]    Mobile Crisis Teams Organization  Address     Phone             Notes  [facility]rapeutic Alternatives, Mobile Crisis Care Unit  [phone]   Assertive Psychotherapeutic  Services  [address] [place], [place]   [facility] [address], [place] [phone]    Self-Help/Support Groups Organization         Address     Phone             Notes  [place]. of [place] - variety of support groups  [place] Call for more information  Narcotics Anonymous (NA), Caring Services [address], [employer] [place]  2 meetings at this location   Special educational needs teacher         Address     Phone             Notes  ASAP Residential Treatment [place],    [place]  [phone]   [facility]  [address], [place] [id number], [place], [place]   [place], [place] [phone] Admissions: 8am-3pm M-F  Incentives Substance [place] [address],    [place], [place] [phone]   [facility] [address] [place], [place], [place]   [facility] [address]  [place], [place]   Insight Programs - Intensive Outpatient [address] Kristeen [address], [place], [place]   [facility] ([place].) [place].,  [place], [place] [phone] or [phone]   Residential Treatment Services (RTS) [address] [place], [place] Accepts Medicaid  [facility] [place] [address]  [place] [phone] Substance Abuse/Addiction Treatment   [facility] Organization         Address     Phone             Notes  [facility]  [phone]   Domenic Schwab, PhD [address] [place]lis [place], [place]   [phone] or [phone]   [place], [place] [phone]   [facility] [address], [place], [place] [phone] Insurance/Medicaid/sponsorship through [facility] and Families [address] [place]                                     [place], [place] [phone] [place] [address], [place] [phone]    Dr. Adele Schilder  [phone]   [facility] Dept. 1) [address] [place] 2) [place] 3)  [place] [address], [place] [phone]  [phone]   [place] [phone] or [phone] (After Hours)     [place]: Abuse and Market researcher         Address     Phone             Notes  Child/Elder Abuse Hotline  [phone]   Family Abuse Services  [phone] 24 hour crisis line  [place]  [place] Substance Use Organization  Address     Phone             Notes  [employer]   [phone] 24 hour crisis line  Advance Access  [address] [place] [phone] Monday- Friday, walk-in,  8am-8pm  RTSA Detoxification & Crisis Stabilization  [phone]   Alcoholics Anonymous [phone] [employer]  Narcotics Anonymous  ([phone]     Health Clinics & Urgent Care Centers Organization         Address     Phone             Notes  [facility] Health Department  [phone]   [facility] [place]  [phone]   [facility]  [phone]   [facility]  [phone] Uninsured patients meeting eligibility requirement  [facility] Health Services      [facility]  [phone]      [facility]  [phone]      [facility]  [phone]      [facility]   [phone]     [facility]  [phone]     Additional [facility] Resources Organization          Address     Phone             Notes  [facility]  [phone]   [place]  [phone] Medicaid, Nutrition, Medicine Assistance, Utility Assistance  [facility] Authority 731-705-7163   [place] [facility]  [phone]    [facility]  [phone]) [phone] [facility] Shelter  [facility] [place]  580-998-3382 Adult & family shelter, food, utility & rent assistance  24 Hour crisis line for those facing homelessness  [phone]   [facility]  [phone] [place], [facility] public transportation system  Homecare Providers  [phone] HIV/AIDS Case Management, FREE HIV SCREEN  Medication Management  [phone] Ongoing medication assistance for patients meeting eligibility requirements  Medication Drop Box Locations: [address] Police Dept., [facility] Police Dept., [employer] Police Dept., Outpatient Carecenter office  Safely rid of unused medications  [employer]  (682)775-0614 Crisis assistance, medication, housing, food, utility assistance  [employer] Info.  [facility])  [phone]           Near-Syncope Near-syncope (commonly known as near fainting) is sudden weakness, dizziness, or feeling like you might pass out. This can happen when getting up or while standing for a long time. It is caused by a sudden decrease in blood flow to the brain, which can occur for various reasons. Most of the reasons are not serious.  HOME CARE Watch your condition for any changes.  Have someone stay with you until you feel stable.  If you feel like you are going to pass out:  Lie down right away.  Prop your feet up if you can.  Breathe deeply and steadily.  Move only when the feeling has gone away. Most of the time, this feeling lasts only a few minutes. You may feel tired for several hours.  Drink enough fluids to keep your pee (urine) clear or pale yellow.  If  you are taking blood pressure or heart medicine, stand up slowly.  Follow up with your doctor as told. GET HELP RIGHT AWAY IF:   You have a severe headache.  You have unusual pain in the chest, belly (abdomen), or back.  You have bleeding from the mouth or butt (rectum), or you have black or tarry poop (stool).  You feel your heart beat differently than normal, or you have a very fast pulse.  You pass out, or you twitch and shake when you pass out.  You pass out when sitting or lying down.  You feel confused.  You have trouble walking.  You are weak.  You have vision problems. MAKE SURE YOU:   Understand these instructions.  Will watch your condition.  Will get help right away if you are not doing well or get worse. Document Released: 12/16/2007 Document Revised: 07/04/2013 Document Reviewed: 12/02/2012 [facility] Patient Information 2015 [place], [place]. This information is not intended to replace advice given to you by your health care provider. Make sure you discuss any questions you have with your health care provider.

## 2015-02-15 NOTE — ED Notes (Signed)
Pt amb to triage with quick steady gait in nad. Pt reports intermittent dizzy feeling and headaches x Tuesday.

## 2015-02-15 NOTE — ED Provider Notes (Signed)
CSN: [id number]     Arrival date & time 02/15/15  1123 History   First MD Initiated Contact with Patient 02/15/15 1151     Chief Complaint  Patient presents with  . Headache     (Consider location/radiation/quality/duration/timing/severity/associated sxs/prior Treatment) HPI  24 year old male who presents with lightheadedness and near syncope. 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 Reports that for the past 3-4 days he is having increasing episodes of feeling lightheaded and unwell. Reports that just prior to this, he had drank detox drink that he got from the gas station. He reports of the week prior he has been working conditions without air conditioning in the heat. He has been keeping up with his fluids and oral intake, but reports that he usually goes through the day only eating 1 meal at night. He denies any syncope and denies any exertional symptoms. He has noted mild intermittent headaches, that or not of sudden onset maximal intensity. Although CC headache, he reports that he currently does not have HA, and that during past week, he has had mild intermittent headache as an associating symptoms.  He denies vision changes, speech changes, vision changes, focal weakness or numbness, syncope, chest pain, difficulty breathing. He has not had any recent illnesses and denies fevers, chills, nausea, vomiting, diarrhea, or urinary symptoms.   Past Medical History  Diagnosis Date  . Asthma   . Seizures    Past Surgical History  Procedure Laterality Date  . Hernia repair    . Tonsillectomy    . Circumcision     History reviewed. No pertinent family history. History  Substance Use Topics  . Smoking status: Current Every Day Smoker -- 0.00 packs/day  . Smokeless tobacco: Not on file  . Alcohol Use: No    Review of Systems  10 out of 14 systems reviewed and are negative other than those stated in the history of present illness     Allergies  Robitussin  Home Medications   Prior to Admission medications   Not on File   BP 120/75 mmHg  Pulse 61  Temp(Src) 98.6 F (37 C) (Oral)  Resp 18  SpO2 100% Physical Exam Physical Exam  Constitutional: Well developed, well nourished, non-toxic, and in no acute distress Head: Normocephalic and atraumatic.  Mouth/Throat: Oropharynx is clear, but dry.  Neck: Normal range of motion. Neck supple.  Cardiovascular: Tachycardic rate and regular rhythm.  No edema. Normal capillary refill.  Pulmonary/Chest: Effort normal and breath sounds normal.  Abdominal: Soft. There is no tenderness. There is no rebound and no guarding.  Musculoskeletal: Normal range of motion.  Neurological: Alert, no facial droop, fluent speech, moves all extremities symmetrically Skin: Skin is warm and dry.  Psychiatric: Cooperative  ED Course  Procedures (including critical care time) Labs Review Labs Reviewed  CBC WITH DIFFERENTIAL/PLATELET - Abnormal; Notable for the following:    Neutrophils Relative % 37 (*)    Eosinophils Relative 11 (*)    All other components within normal limits  BASIC METABOLIC PANEL - Abnormal; Notable for the following:    Glucose, Bld 107 (*)    All other components within normal limits  MAGNESIUM    Imaging Review No results found.   EKG Interpretation   Date/Time:  Friday February 15 2015 12:19:56 EDT Ventricular Rate:  104 PR Interval:  144 QRS Duration: 84 QT Interval:  334 QTC Calculation: 439 R Axis:   61 Text Interpretation:  Sinus tachycardia Otherwise normal ECG No prior for  comparison.  Confirmed by Alyaan Budzynski MD, Annabelle Harman [phone]) on 02/15/2015 1:35:17 PM      MDM   Final diagnoses:  Lightheadedness    In short, this is 24 year old male who presents with lightheadedness. He is non-toxic and in no acute distress. He has mild tachycardia on arrival, but remainder of VS unremarkable. He has mildly dry mucous membranes. Remainder of exam, including  cardiopulmonary exam is unremarkable.   EKG shows no stigmata of arrhythmia and just sinus tachycardia. Basic blood work including CBC, basic, electrolytes are unremarkable and not etiology of symptoms. With IV fluids, he reports improvement in symptoms and tachycardia is resolved. Overall, lightheadedness seem orthostatic in nature and c/w with mild dehydration. Also discussed that he may have symptoms of hypoglycemia during day as he only eats one meal per day at night. Supportive care discussed for home. Strict return and follow-up instructions reviewed. He expressed understanding of all discharge instructions and felt comfortable with the plan of care.   Lavera Guise, MD 02/16/15 [phone]

## 2015-03-11 ENCOUNTER — Encounter (HOSPITAL_BASED_OUTPATIENT_CLINIC_OR_DEPARTMENT_OTHER): Payer: Self-pay | Admitting: *Deleted

## 2015-03-11 ENCOUNTER — Emergency Department (HOSPITAL_BASED_OUTPATIENT_CLINIC_OR_DEPARTMENT_OTHER)
Admission: EM | Admit: 2015-03-11 | Discharge: 2015-03-11 | Disposition: A | Discharge status: Home / Self Care | Payer: No Typology Code available for payment source | Attending: Emergency Medicine | Admitting: Emergency Medicine

## 2015-03-11 DIAGNOSIS — J45909 Unspecified asthma, uncomplicated: Secondary | ICD-10-CM | POA: Diagnosis not present

## 2015-03-11 DIAGNOSIS — R42 Dizziness and giddiness: Secondary | ICD-10-CM | POA: Diagnosis present

## 2015-03-11 DIAGNOSIS — Z720 Tobacco use: Secondary | ICD-10-CM | POA: Diagnosis not present

## 2015-03-11 LAB — CBC WITH DIFFERENTIAL/PLATELET
BASOS ABS: 0 10*3/uL (ref 0.0–0.1)
Basophils Relative: 1 % (ref 0–1)
EOS ABS: 0.6 10*3/uL (ref 0.0–0.7)
Eosinophils Relative: 12 % — ABNORMAL HIGH (ref 0–5)
HCT: 41.5 % (ref 39.0–52.0)
Hemoglobin: 14.6 g/dL (ref 13.0–17.0)
LYMPHS ABS: 2.2 10*3/uL (ref 0.7–4.0)
LYMPHS PCT: 44 % (ref 12–46)
MCH: 31.6 pg (ref 26.0–34.0)
MCHC: 35.2 g/dL (ref 30.0–36.0)
MCV: 89.8 fL (ref 78.0–100.0)
Monocytes Absolute: 0.4 10*3/uL (ref 0.1–1.0)
Monocytes Relative: 7 % (ref 3–12)
Neutro Abs: 1.8 10*3/uL (ref 1.7–7.7)
Neutrophils Relative %: 36 % — ABNORMAL LOW (ref 43–77)
Platelets: 204 10*3/uL (ref 150–400)
RBC: 4.62 MIL/uL (ref 4.22–5.81)
RDW: 11.6 % (ref 11.5–15.5)
WBC: 5 10*3/uL (ref 4.0–10.5)

## 2015-03-11 LAB — BASIC METABOLIC PANEL
Anion gap: 8 (ref 5–15)
BUN: 9 mg/dL (ref 6–20)
CO2: 24 mmol/L (ref 22–32)
CREATININE: 0.89 mg/dL (ref 0.61–1.24)
Calcium: 9.3 mg/dL (ref 8.9–10.3)
Chloride: 108 mmol/L (ref 101–111)
GFR calc Af Amer: >60 mL/min (ref >60)
GLUCOSE: 97 mg/dL (ref 65–99)
Potassium: 3.8 mmol/L (ref 3.5–5.1)
SODIUM: 140 mmol/L (ref 135–145)

## 2015-03-11 NOTE — Discharge Instructions (Signed)
You were evaluated in the ED today for your dizziness. There does not appear to be an emergent cause for your symptoms at this time your EKG and lab work was very reassuring. It is important to follow up with your doctors as needed for reevaluation of your symptoms. Return to ED for new or worsening symptoms.  Dizziness Dizziness is a common problem. It is a feeling of unsteadiness or light-headedness. You may feel like you are about to faint. Dizziness can lead to injury if you stumble or fall. A person of any age group can suffer from dizziness, but dizziness is more common in older adults. CAUSES  Dizziness can be caused by many different things, including:  Middle ear problems.  Standing for too long.  Infections.  An allergic reaction.  Aging.  An emotional response to something, such as the sight of blood.  Side effects of medicines.  Tiredness.  Problems with circulation or blood pressure.  Excessive use of alcohol or medicines, or illegal drug use.  Breathing too fast (hyperventilation).  An irregular heart rhythm (arrhythmia).  A low red blood cell count (anemia).  Pregnancy.  Vomiting, diarrhea, fever, or other illnesses that cause body fluid loss (dehydration).  Diseases or conditions such as Parkinson's disease, high blood pressure (hypertension), diabetes, and thyroid problems.  Exposure to extreme heat. DIAGNOSIS  Your health care provider will ask about your symptoms, perform a physical exam, and perform an electrocardiogram (ECG) to record the electrical activity of your heart. Your health care provider may also perform other heart or blood tests to determine the cause of your dizziness. These may include:  Transthoracic echocardiogram (TTE). During echocardiography, sound waves are used to evaluate how blood flows through your heart.  Transesophageal echocardiogram (TEE).  Cardiac monitoring. This allows your health care provider to monitor your heart  rate and rhythm in real time.  Holter monitor. This is a portable device that records your heartbeat and can help diagnose heart arrhythmias. It allows your health care provider to track your heart activity for several days if needed.  Stress tests by exercise or by giving medicine that makes the heart beat faster. TREATMENT  Treatment of dizziness depends on the cause of your symptoms and can vary greatly. HOME CARE INSTRUCTIONS   Drink enough fluids to keep your urine clear or pale yellow. This is especially important in very hot weather. In older adults, it is also important in cold weather.  Take your medicine exactly as directed if your dizziness is caused by medicines. When taking blood pressure medicines, it is especially important to get up slowly.  Rise slowly from chairs and steady yourself until you feel okay.  In the morning, first sit up on the side of the bed. When you feel okay, stand slowly while holding onto something until you know your balance is fine.  Move your legs often if you need to stand in one place for a long time. Tighten and relax your muscles in your legs while standing.  Have someone stay with you for 1-2 days if dizziness continues to be a problem. Do this until you feel you are well enough to stay alone. Have the person call your health care provider if he or she notices changes in you that are concerning.  Do not drive or use heavy machinery if you feel dizzy.  Do not drink alcohol. SEEK IMMEDIATE MEDICAL CARE IF:   Your dizziness or light-headedness gets worse.  You feel nauseous or vomit.  You have problems talking, walking, or using your arms, hands, or legs.  You feel weak.  You are not thinking clearly or you have trouble forming sentences. It may take a friend or family member to notice this.  You have chest pain, abdominal pain, shortness of breath, or sweating.  Your vision changes.  You notice any bleeding.  You have side effects  from medicine that seems to be getting worse rather than better. MAKE SURE YOU:   Understand these instructions.  Will watch your condition.  Will get help right away if you are not doing well or get worse. Document Released: 12/23/2000 Document Revised: 07/04/2013 Document Reviewed: 01/16/2011 [facility] Patient Information 2015 [place], [place]. This information is not intended to replace advice given to you by your health care provider. Make sure you discuss any questions you have with your health care provider.

## 2015-03-11 NOTE — ED Notes (Signed)
PA at bedside.

## 2015-03-11 NOTE — ED Notes (Signed)
weakness x 6 weeks. States he has been seen several times for same.

## 2015-03-11 NOTE — ED Provider Notes (Signed)
CSN: [id number]     Arrival date & time 03/11/15  1411 History   First MD Initiated Contact with Patient 03/11/15 1431     Chief Complaint  Patient presents with  . Dizziness     (Consider location/radiation/quality/duration/timing/severity/associated sxs/prior Treatment) HPI Jerome Murphy is a 24 y.o. male history of asthma in childhood seizures, comes in for evaluation of dizziness. Patient states intermittently over the past 2 months he has short episodes of dizziness where I feel "like my body is floating". Patient states he will typically happen when he is been standing for long periods of time. 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 These sensations usually last only a few seconds and then pass without intervention. Most recent episode occurred yesterday, lasted only a few seconds and spontaneously resolved. He does report drinking water throughout the day, but also admits to drinking several sodas throughout the day. He denies any headache, visual changes, chest pain, shortness of breath, abdominal pain or nausea or vomiting.  Denies any discomfort now in the ED.  Patient also reports he has just established primary care and has an appointment with [facility] on September 14.  Past Medical History  Diagnosis Date  . Asthma   . Seizures    Past Surgical History  Procedure Laterality Date  . Hernia repair    . Tonsillectomy    . Circumcision     No family history on file. Social History  Substance Use Topics  . Smoking status: Current Every Day Smoker -- 0.00 packs/day  . Smokeless tobacco: None  . Alcohol Use: No    Review of Systems A 10 point review of systems was completed and was negative except for pertinent positives and negatives as mentioned in the history of present illness     Allergies  Robitussin  Home Medications   Prior to Admission medications   Not on File   BP 114/64 mmHg  Pulse 86   Temp(Src) 99 F (37.2 C) (Oral)  Resp 16  Ht 6' (1.829 m)  Wt 235 lb (106.595 kg)  BMI 31.86 kg/m2  SpO2 100% Physical Exam  Constitutional: He is oriented to person, place, and time. He appears well-developed and well-nourished.  HENT:  Head: Normocephalic and atraumatic.  Mouth/Throat: Oropharynx is clear and moist.  Eyes: Conjunctivae are normal. Pupils are equal, round, and reactive to light. Right eye exhibits no discharge. Left eye exhibits no discharge. No scleral icterus.  Neck: Neck supple.  Cardiovascular: Normal rate, regular rhythm and normal heart sounds.   No tachycardia on my exam, heart rate 80s  Pulmonary/Chest: Effort normal and breath sounds normal. No respiratory distress. He has no wheezes. He has no rales.  Abdominal: Soft. There is no tenderness.  Musculoskeletal: He exhibits no tenderness.  Neurological: He is alert and oriented to person, place, and time.  Cranial Nerves II-XII grossly intact. Moves all extremities without ataxia. Gait is baseline.  Skin: Skin is warm and dry. No rash noted.  Psychiatric: He has a normal mood and affect.  Nursing note and vitals reviewed.   ED Course  Procedures (including critical care time) Labs Review Labs Reviewed  CBC WITH DIFFERENTIAL/PLATELET - Abnormal; Notable for the following:    Neutrophils Relative % 36 (*)    Eosinophils Relative 12 (*)    All other components within normal limits  BASIC METABOLIC PANEL    Imaging Review No results found. I have personally reviewed and  evaluated these images and lab results as part of my medical decision-making.   EKG Interpretation   Date/Time:  Monday March 11 2015 14:55:49 EDT Ventricular Rate:  92 PR Interval:  138 QRS Duration: 88 QT Interval:  344 QTC Calculation: 425 R Axis:   33 Text Interpretation:  Normal sinus rhythm Normal ECG similar to prior EKG  other then T wave inversion in lead III Confirmed by BELFI  MD, MELANIE  ([id number]) on 03/11/2015  2:58:23 PM     Filed Vitals:   03/11/15 1420 03/11/15 1557  BP: 137/88 114/64  Pulse: 108 86  Temp: 99 F (37.2 C)   TempSrc: Oral   Resp: 18 16  Height: 6' (1.829 m)   Weight: 235 lb (106.595 kg)   SpO2: 99% 100%    MDM  Vitals stable - WNL -afebrile Pt resting comfortably in ED. PE--normal neuro exam, gait is baseline. Grossly benign physical exam. Labwork--labs are noncontributory, no evidence of anemia, no folliculitis disturbances, EKG is reassuring  DDX--patient's fleeting dizziness likely vagal in nature, given it usually only happens while standing. No chest pain, shortness of breath. Low suspicion for central lesion. No LOC. No intraoral trauma, loss of bowel or bladder function, doubt seizure. We'll have patient follow-up PCP for further evaluation and management of symptoms at regularly scheduled appointment on September 14. No evidence of acute or emergent pathology today in the ED.  I discussed all relevant lab findings and imaging results with pt and they verbalized understanding. Discussed f/u with PCP within 48 hrs and return precautions, pt very amenable to plan.  Final diagnoses:  Dizzy spells        Joycie Peek, PA-C 03/11/15 1607  Rolan Bucco, MD 03/12/15 [phone]

## 2015-03-30 ENCOUNTER — Encounter (HOSPITAL_BASED_OUTPATIENT_CLINIC_OR_DEPARTMENT_OTHER): Payer: Self-pay | Admitting: Emergency Medicine

## 2015-03-30 ENCOUNTER — Emergency Department (HOSPITAL_BASED_OUTPATIENT_CLINIC_OR_DEPARTMENT_OTHER)
Admission: EM | Admit: 2015-03-30 | Discharge: 2015-03-30 | Disposition: A | Discharge status: Home / Self Care | Payer: No Typology Code available for payment source | Attending: Emergency Medicine | Admitting: Emergency Medicine

## 2015-03-30 ENCOUNTER — Emergency Department (HOSPITAL_BASED_OUTPATIENT_CLINIC_OR_DEPARTMENT_OTHER): Payer: No Typology Code available for payment source

## 2015-03-30 DIAGNOSIS — R55 Syncope and collapse: Secondary | ICD-10-CM

## 2015-03-30 DIAGNOSIS — Z8669 Personal history of other diseases of the nervous system and sense organs: Secondary | ICD-10-CM | POA: Insufficient documentation

## 2015-03-30 DIAGNOSIS — R002 Palpitations: Secondary | ICD-10-CM | POA: Diagnosis not present

## 2015-03-30 DIAGNOSIS — Z720 Tobacco use: Secondary | ICD-10-CM | POA: Insufficient documentation

## 2015-03-30 DIAGNOSIS — J45909 Unspecified asthma, uncomplicated: Secondary | ICD-10-CM | POA: Insufficient documentation

## 2015-03-30 HISTORY — DX: Narcolepsy without cataplexy: G47.419

## 2015-03-30 LAB — CBC WITH DIFFERENTIAL/PLATELET
Basophils Absolute: 0 10*3/uL (ref 0.0–0.1)
Basophils Relative: 0 %
EOS ABS: 0.5 10*3/uL (ref 0.0–0.7)
Eosinophils Relative: 8 %
HCT: 41.1 % (ref 39.0–52.0)
Hemoglobin: 14.1 g/dL (ref 13.0–17.0)
Lymphocytes Relative: 43 %
Lymphs Abs: 2.5 10*3/uL (ref 0.7–4.0)
MCH: 31.1 pg (ref 26.0–34.0)
MCHC: 34.3 g/dL (ref 30.0–36.0)
MCV: 90.5 fL (ref 78.0–100.0)
MONOS PCT: 8 %
Monocytes Absolute: 0.5 10*3/uL (ref 0.1–1.0)
Neutro Abs: 2.4 10*3/uL (ref 1.7–7.7)
Neutrophils Relative %: 41 %
PLATELETS: 197 10*3/uL (ref 150–400)
RBC: 4.54 MIL/uL (ref 4.22–5.81)
RDW: 12 % (ref 11.5–15.5)
WBC: 5.8 10*3/uL (ref 4.0–10.5)

## 2015-03-30 LAB — BASIC METABOLIC PANEL
Anion gap: 5 (ref 5–15)
BUN: 10 mg/dL (ref 6–20)
CHLORIDE: 107 mmol/L (ref 101–111)
CO2: 26 mmol/L (ref 22–32)
CREATININE: 0.97 mg/dL (ref 0.61–1.24)
Calcium: 9.3 mg/dL (ref 8.9–10.3)
GFR calc Af Amer: >60 mL/min (ref >60)
Glucose, Bld: 99 mg/dL (ref 65–99)
Potassium: 3.9 mmol/L (ref 3.5–5.1)
SODIUM: 138 mmol/L (ref 135–145)

## 2015-03-30 LAB — URINALYSIS, ROUTINE W REFLEX MICROSCOPIC
BILIRUBIN URINE: NEGATIVE
GLUCOSE, UA: NEGATIVE mg/dL
Hgb urine dipstick: NEGATIVE
Ketones, ur: NEGATIVE mg/dL
Leukocytes, UA: NEGATIVE
Nitrite: NEGATIVE
Protein, ur: NEGATIVE mg/dL
Specific Gravity, Urine: 1.026 (ref 1.005–1.030)
Urobilinogen, UA: 1 mg/dL (ref 0.0–1.0)
pH: 6.5 (ref 5.0–8.0)

## 2015-03-30 LAB — TSH: TSH: 0.95 u[IU]/mL (ref 0.350–4.500)

## 2015-03-30 MED ORDER — SODIUM CHLORIDE 0.9 % IV BOLUS (SEPSIS)
1000.0000 mL | Freq: Once | INTRAVENOUS | Status: AC
  Administered 2015-03-30: 1000 mL via INTRAVENOUS

## 2015-03-30 NOTE — ED Notes (Signed)
Pt states feeling "not right" for past 3 months and has seen PMD for this with appointment scheduled for followup at [facility] in December.  Pt states heart sometimes feels "like its going out" and it wakes him from sleep.  Pt also complains of metallic taste in his mouth precipitating episode of palpitations.  Episode lasts from a few minutes to a few hours.  Pt history of seizures.

## 2015-03-30 NOTE — ED Provider Notes (Signed)
CSN: [id number]     Arrival date & time 03/30/15  1723 History   First MD Initiated Contact with Patient 03/30/15 1747     Chief Complaint  Patient presents with  . Palpitations     (Consider location/radiation/quality/duration/timing/severity/associated sxs/prior Treatment) Patient is a 24 y.o. male presenting with palpitations.  Palpitations   Blood pressure 129/71, pulse 93, temperature 98.2 F (36.8 C), temperature source Oral, resp. rate 18, height 6' (1.829 m), weight 240 lb (108.863 kg), SpO2 97 %.  Jerome Murphy is a 24 y.o. male complaining of episodes of "feeling funny" intermittently on and off for the last month. Patient states he feels like his heart is pounding he states that he feels like a feather and is floating he has associated shortness of breath and feels like he's going to pass out. He has a metallic taste in his mouth. He had an episode this afternoon just after he got off of work. He did not actually syncopized he had no chest pain associated with it. He's had multiple similar episodes in the past and has been evaluated for similar multiple EGDs. His primary care is aware and he is an appointment at [facility] for evaluation next month. He is never seen a cardiologist. He has history of seizures as a child but is not taking any anti-convulsions. Is on no medications. States that he smokes marijuana intermittently but it did not smoking today. He has no symptoms at this time. Symptoms resolved over the course of 45 minutes. It has been eating and drinking normally, no vomiting, melena, hematochezia, abdominal pain.  Past Medical History  Diagnosis Date  . Asthma   . Seizures   . Narcolepsy    Past Surgical History  Procedure Laterality Date  . Hernia repair    . Tonsillectomy    . Circumcision     No family history on file. Social History  Substance Use Topics  . Smoking status: Current Every Day Smoker -- 0.00 packs/day  . Smokeless tobacco:  None  . Alcohol Use: Yes     Comment: occasionallly    Review of Systems  Cardiovascular: Positive for palpitations.    10 systems reviewed and found to be negative, except as noted in the HPI.   Allergies  Robitussin  Home Medications   Prior to Admission medications   Not on File   BP 129/71 mmHg  Pulse 93  Temp(Src) 98.2 F (36.8 C) (Oral)  Resp 18  Ht 6' (1.829 m)  Wt 240 lb (108.863 kg)  BMI 32.54 kg/m2  SpO2 97% Physical Exam  Constitutional: He is oriented to person, place, and time. He appears well-developed and well-nourished. No distress.  HENT:  Head: Normocephalic and atraumatic.  Mouth/Throat: Oropharynx is clear and moist.  No conjunctival pallor.  Eyes: Conjunctivae and EOM are normal. Pupils are equal, round, and reactive to light.  Neck: Normal range of motion.  Cardiovascular: Normal rate, regular rhythm and intact distal pulses.   Pulmonary/Chest: Effort normal and breath sounds normal. No stridor. No respiratory distress. He has no wheezes. He has no rales. He exhibits no tenderness.  Abdominal: Soft. Bowel sounds are normal. He exhibits no distension and no mass. There is no tenderness. There is no rebound and no guarding.  Musculoskeletal: Normal range of motion.  Neurological: He is alert and oriented to person, place, and time.  Skin: He is not diaphoretic.  Psychiatric: He has a normal mood and affect.  Nursing note and vitals reviewed.  ED Course  Procedures (including critical care time) Labs Review Labs Reviewed  TSH  URINALYSIS, ROUTINE W REFLEX MICROSCOPIC (NOT AT [facility])  CBC WITH DIFFERENTIAL/PLATELET  BASIC METABOLIC PANEL    Imaging Review No results found. I have personally reviewed and evaluated these images and lab results as part of my medical decision-making.   EKG Interpretation   Date/Time:  Saturday March 30 2015 17:54:01 EDT Ventricular Rate:  94 PR Interval:  142 QRS Duration: 86 QT Interval:  330 QTC  Calculation: 412 R Axis:   44 Text Interpretation:  Normal sinus rhythm with sinus arrhythmia Possible  Left atrial enlargement Borderline ECG No significant change since last  tracing Confirmed by Gwendolyn Grant  MD, BLAIR ([id number]) on 03/30/2015 7:16:37 PM      MDM   Final diagnoses:  Palpitations  Pre-syncope    Filed Vitals:   03/30/15 1741  BP: 129/71  Pulse: 93  Temp: 98.2 F (36.8 C)  TempSrc: Oral  Resp: 18  Height: 6' (1.829 m)  Weight: 240 lb (108.863 kg)  SpO2: 97%    Medications  sodium chloride 0.9 % bolus 1,000 mL (0 mLs Intravenous Stopped 03/30/15 2043)    Jerome Murphy is a pleasant 24 y.o. male presenting with presyncopal sensation and palpitations. This is a recurrent issue for this patient. His EKG is without arrhythmia, blood work reassuring. TSH is drawn but I advised him that this will not result for several days and his primary care physician will have to follow-up the results. Patient has specifically asked about Holter monitoring, will give cardiology referral so he can discuss this with them. His vital signs are not orthostatic. Patient is bolused in the ED and asymptomatic. Advise close follow-up with primary care.  Evaluation does not show pathology that would require ongoing emergent intervention or inpatient treatment. Pt is hemodynamically stable and mentating appropriately. Discussed findings and plan with patient/guardian, who agrees with care plan. All questions answered. Return precautions discussed and outpatient follow up given.     Wynetta Emery, PA-C 03/31/15 1610  Elwin Mocha, MD 03/31/15 [phone]

## 2015-03-30 NOTE — ED Notes (Addendum)
Back from xray, alert, NAD, calm, interactive, no dyspnea noted. 

## 2015-03-30 NOTE — ED Notes (Addendum)
Pt denies any sx or complaints, "feel normal", (denies: pain, sob, palpitations, dizziness, nausea, visual changes or other sx), pending xray results, family at [facility] x2, updated. NSR with sinus arrhythmia noted on monitor, VSS.

## 2015-03-30 NOTE — Discharge Instructions (Signed)
Please follow with your primary care doctor in the next 2 days for a check-up. They must obtain records for further management.  ° °Do not hesitate to return to the Emergency Department for any new, worsening or concerning symptoms.  ° ° °Near-Syncope °Near-syncope (commonly known as near fainting) is sudden weakness, dizziness, or feeling like you might pass out. During an episode of near-syncope, you may also develop pale skin, have tunnel vision, or feel sick to your stomach (nauseous). Near-syncope may occur when getting up after sitting or while standing for a long time. It is caused by a sudden decrease in blood flow to the brain. This decrease can result from various causes or triggers, most of which are not serious. However, because near-syncope can sometimes be a sign of something serious, a medical evaluation is required. The specific cause is often not determined. °HOME CARE INSTRUCTIONS  °Monitor your condition for any changes. The following actions may help to alleviate any discomfort you are experiencing: °· Have someone stay with you until you feel stable. °· Lie down right away and prop your feet up if you start feeling like you might faint. Breathe deeply and steadily. Wait until all the symptoms have passed. Most of these episodes last only a few minutes. You may feel tired for several hours.   °· Drink enough fluids to keep your urine clear or pale yellow.   °· If you are taking blood pressure or heart medicine, get up slowly when seated or lying down. Take several minutes to sit and then stand. This can reduce dizziness. °· Follow up with your health care provider as directed.  °SEEK IMMEDIATE MEDICAL CARE IF:  °· You have a severe headache.   °· You have unusual pain in the chest, abdomen, or back.   °· You are bleeding from the mouth or rectum, or you have black or tarry stool.   °· You have an irregular or very fast heartbeat.   °· You have repeated fainting or have seizure-like jerking during  an episode.   °· You faint when sitting or lying down.   °· You have confusion.   °· You have difficulty walking.   °· You have severe weakness.   °· You have vision problems.   °MAKE SURE YOU:  °· Understand these instructions. °· Will watch your condition. °· Will get help right away if you are not doing well or get worse. °Document Released: 06/29/2005 Document Revised: 07/04/2013 Document Reviewed: 12/02/2012 °[employer]® Patient Information ©2015 [employer]. This information is not intended to replace advice given to you by your health care provider. Make sure you discuss any questions you have with your health care provider. ° °

## 2015-03-30 NOTE — ED Notes (Signed)
Pt reporting intermittent SOB and associated feelings of palpitations. Pt ambulatory on arrival, breathing even and unlabored, speaking in complete sentences with NAD noted. Reports he has been seen and multiple EDs recently.

## 2016-06-26 DIAGNOSIS — X12XXXA Contact with other hot fluids, initial encounter: Secondary | ICD-10-CM | POA: Insufficient documentation

## 2016-06-26 DIAGNOSIS — Y93G3 Activity, cooking and baking: Secondary | ICD-10-CM | POA: Insufficient documentation

## 2016-06-26 DIAGNOSIS — Y998 Other external cause status: Secondary | ICD-10-CM | POA: Insufficient documentation

## 2016-06-26 DIAGNOSIS — F17200 Nicotine dependence, unspecified, uncomplicated: Secondary | ICD-10-CM | POA: Insufficient documentation

## 2016-06-26 DIAGNOSIS — T25222A Burn of second degree of left foot, initial encounter: Secondary | ICD-10-CM | POA: Insufficient documentation

## 2016-06-26 DIAGNOSIS — Y929 Unspecified place or not applicable: Secondary | ICD-10-CM | POA: Insufficient documentation

## 2016-06-26 DIAGNOSIS — J45909 Unspecified asthma, uncomplicated: Secondary | ICD-10-CM | POA: Insufficient documentation
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
## 2016-06-27 ENCOUNTER — Emergency Department (HOSPITAL_COMMUNITY): Payer: No Typology Code available for payment source

## 2016-06-27 ENCOUNTER — Emergency Department
Admission: EM | Admit: 2016-06-27 | Discharge: 2016-06-27 | Disposition: A | Discharge status: Home / Self Care | Payer: No Typology Code available for payment source | Attending: Emergency Medicine | Admitting: Emergency Medicine

## 2016-06-27 ENCOUNTER — Emergency Department (HOSPITAL_COMMUNITY)
Admission: EM | Admit: 2016-06-27 | Discharge: 2016-06-27 | Disposition: A | Discharge status: Home / Self Care | Payer: No Typology Code available for payment source | Attending: Emergency Medicine | Admitting: Emergency Medicine

## 2016-06-27 ENCOUNTER — Encounter (HOSPITAL_COMMUNITY): Payer: Self-pay | Admitting: Emergency Medicine

## 2016-06-27 DIAGNOSIS — R002 Palpitations: Secondary | ICD-10-CM | POA: Insufficient documentation

## 2016-06-27 DIAGNOSIS — J45909 Unspecified asthma, uncomplicated: Secondary | ICD-10-CM | POA: Insufficient documentation

## 2016-06-27 DIAGNOSIS — Z79899 Other long term (current) drug therapy: Secondary | ICD-10-CM | POA: Insufficient documentation

## 2016-06-27 DIAGNOSIS — T25222A Burn of second degree of left foot, initial encounter: Secondary | ICD-10-CM

## 2016-06-27 DIAGNOSIS — F17200 Nicotine dependence, unspecified, uncomplicated: Secondary | ICD-10-CM | POA: Insufficient documentation

## 2016-06-27 LAB — CBC
HEMATOCRIT: 39 % (ref 39.0–52.0)
Hemoglobin: 13.8 g/dL (ref 13.0–17.0)
MCH: 31.7 pg (ref 26.0–34.0)
MCHC: 35.4 g/dL (ref 30.0–36.0)
MCV: 89.7 fL (ref 78.0–100.0)
Platelets: 217 10*3/uL (ref 150–400)
RBC: 4.35 MIL/uL (ref 4.22–5.81)
RDW: 12.4 % (ref 11.5–15.5)
WBC: 5.1 10*3/uL (ref 4.0–10.5)

## 2016-06-27 LAB — BASIC METABOLIC PANEL
ANION GAP: 6 (ref 5–15)
BUN: 6 mg/dL (ref 6–20)
CALCIUM: 8.9 mg/dL (ref 8.9–10.3)
CO2: 25 mmol/L (ref 22–32)
Chloride: 109 mmol/L (ref 101–111)
Creatinine, Ser: 1.02 mg/dL (ref 0.61–1.24)
GFR calc Af Amer: >60 mL/min (ref >60)
Glucose, Bld: 93 mg/dL (ref 65–99)
POTASSIUM: 3.8 mmol/L (ref 3.5–5.1)
Sodium: 140 mmol/L (ref 135–145)

## 2016-06-27 LAB — I-STAT TROPONIN, ED: TROPONIN I, POC: 0 ng/mL (ref 0.00–0.08)

## 2016-06-27 MED ORDER — SULFAMETHOXAZOLE-TRIMETHOPRIM 800-160 MG PO TABS
1.0000 | ORAL_TABLET | Freq: Two times a day (BID) | ORAL | 0 refills | Status: AC
Start: 2016-06-27 — End: 2016-07-07

## 2016-06-27 MED ORDER — IBUPROFEN 800 MG PO TABS: 800.0000 mg | ORAL_TABLET | Freq: Three times a day (TID) | ORAL | 0 refills | Status: DC | PRN

## 2016-06-27 MED ORDER — IBUPROFEN 800 MG PO TABS
800.0000 mg | ORAL_TABLET | Freq: Once | ORAL | Status: AC
  Administered 2016-06-27: 800 mg via ORAL
  Filled 2016-06-27: qty 1

## 2016-06-27 MED ORDER — SULFAMETHOXAZOLE-TRIMETHOPRIM 800-160 MG PO TABS
1.0000 | ORAL_TABLET | Freq: Once | ORAL | Status: AC
Start: 2016-06-27 — End: 2016-06-27
  Administered 2016-06-27: 1 via ORAL
  Filled 2016-06-27: qty 1

## 2016-06-27 MED ORDER — SILVER SULFADIAZINE 1 % EX CREA
TOPICAL_CREAM | CUTANEOUS | Status: AC
  Filled 2016-06-27: qty 85

## 2016-06-27 MED ORDER — SILVER SULFADIAZINE 1 % EX CREA: TOPICAL_CREAM | Freq: Two times a day (BID) | CUTANEOUS | Status: DC
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
## 2016-06-27 NOTE — ED Notes (Signed)
Pt verbalizes understanding of DC instructions verbalizes the importance to F/u for wound check
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
## 2016-06-27 NOTE — Discharge Instructions (Signed)
As discussed, your evaluation today has been largely reassuring.  But, it is important that you monitor your condition carefully, and do not hesitate to return to the ED if you develop new, or concerning changes in your condition. ? ?Otherwise, please follow-up with your physician for appropriate ongoing care. ? ?

## 2016-06-27 NOTE — ED Notes (Signed)
Pt is in stable condition upon d/c and ambulates from ED. 
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
## 2016-06-27 NOTE — ED Notes (Signed)
Pt left exam room ambulatory, limping some no distress noted
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
## 2016-12-12 ENCOUNTER — Emergency Department
Admission: EM | Admit: 2016-12-12 | Discharge: 2016-12-12 | Disposition: A | Discharge status: Home / Self Care | Payer: No Typology Code available for payment source | Attending: Emergency Medicine | Admitting: Emergency Medicine

## 2016-12-12 DIAGNOSIS — J45909 Unspecified asthma, uncomplicated: Secondary | ICD-10-CM | POA: Insufficient documentation

## 2016-12-12 DIAGNOSIS — F17200 Nicotine dependence, unspecified, uncomplicated: Secondary | ICD-10-CM | POA: Insufficient documentation

## 2016-12-12 DIAGNOSIS — K0889 Other specified disorders of teeth and supporting structures: Secondary | ICD-10-CM | POA: Insufficient documentation

## 2016-12-12 MED ORDER — AMOXICILLIN-POT CLAVULANATE 875-125 MG PO TABS
1.0000 | ORAL_TABLET | Freq: Once | ORAL | Status: AC
  Administered 2016-12-12: 1 via ORAL
  Filled 2016-12-12: qty 1

## 2016-12-12 MED ORDER — KETOROLAC TROMETHAMINE 10 MG PO TABS
10.0000 mg | ORAL_TABLET | Freq: Once | ORAL | Status: AC
  Administered 2016-12-12: 10 mg via ORAL
  Filled 2016-12-12: qty 1

## 2016-12-12 MED ORDER — KETOROLAC TROMETHAMINE 10 MG PO TABS: 10.0000 mg | ORAL_TABLET | Freq: Four times a day (QID) | ORAL | 0 refills | Status: DC | PRN

## 2016-12-12 MED ORDER — LIDOCAINE VISCOUS 2 % MT SOLN
15.0000 mL | Freq: Once | OROMUCOSAL | Status: AC
  Administered 2016-12-12: 15 mL via OROMUCOSAL
  Filled 2016-12-12: qty 15

## 2016-12-12 MED ORDER — AMOXICILLIN-POT CLAVULANATE 875-125 MG PO TABS: 1.0000 | ORAL_TABLET | Freq: Two times a day (BID) | ORAL | 0 refills | Status: AC
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
## 2016-12-12 NOTE — ED Notes (Signed)
Pt. Going home with friend 

## 2016-12-12 NOTE — ED Notes (Signed)
Pt states he had a crown place about 8 years ago but he says "there's something under the crown". Family at bedside. Says ice helps

## 2016-12-12 NOTE — ED Triage Notes (Signed)
Pt to triage with steady gait. Pt reports pain to his upper front tooth twice today. Pt reports he has had a root canal and crown to the tooth about 8 years ago. Pt reports it feels like the tooth is being bent back. Pt talking in full and complete sentences with no difficulty at this time.

## 2017-01-22 ENCOUNTER — Encounter: Payer: Self-pay | Admitting: Emergency Medicine

## 2017-01-22 ENCOUNTER — Emergency Department
Admission: EM | Admit: 2017-01-22 | Discharge: 2017-01-22 | Disposition: A | Discharge status: Home / Self Care | Payer: No Typology Code available for payment source | Attending: Emergency Medicine | Admitting: Emergency Medicine

## 2017-01-22 ENCOUNTER — Emergency Department: Payer: No Typology Code available for payment source

## 2017-01-22 DIAGNOSIS — J45901 Unspecified asthma with (acute) exacerbation: Secondary | ICD-10-CM | POA: Insufficient documentation

## 2017-01-22 DIAGNOSIS — F17210 Nicotine dependence, cigarettes, uncomplicated: Secondary | ICD-10-CM | POA: Insufficient documentation

## 2017-01-22 DIAGNOSIS — J069 Acute upper respiratory infection, unspecified: Secondary | ICD-10-CM | POA: Insufficient documentation

## 2017-01-22 DIAGNOSIS — Z79899 Other long term (current) drug therapy: Secondary | ICD-10-CM | POA: Insufficient documentation

## 2017-01-22 LAB — URINALYSIS, COMPLETE (UACMP) WITH MICROSCOPIC
Bacteria, UA: NONE SEEN
Glucose, UA: NEGATIVE mg/dL
HGB URINE DIPSTICK: NEGATIVE
Ketones, ur: 5 mg/dL — AB
LEUKOCYTES UA: NEGATIVE
NITRITE: NEGATIVE
Protein, ur: 100 mg/dL — AB
SPECIFIC GRAVITY, URINE: 1.04 — AB (ref 1.005–1.030)
SQUAMOUS EPITHELIAL / LPF: NONE SEEN
pH: 5 (ref 5.0–8.0)

## 2017-01-22 LAB — CBC WITH DIFFERENTIAL/PLATELET
Basophils Absolute: 0.1 10*3/uL (ref 0–0.1)
Basophils Relative: 1 %
EOS PCT: 13 %
Eosinophils Absolute: 1.1 10*3/uL — ABNORMAL HIGH (ref 0–0.7)
HCT: 47.4 % (ref 40.0–52.0)
HEMOGLOBIN: 16.2 g/dL (ref 13.0–18.0)
LYMPHS ABS: 3.7 10*3/uL — AB (ref 1.0–3.6)
LYMPHS PCT: 45 %
MCH: 31 pg (ref 26.0–34.0)
MCHC: 34.3 g/dL (ref 32.0–36.0)
MCV: 90.6 fL (ref 80.0–100.0)
Monocytes Absolute: 0.5 10*3/uL (ref 0.2–1.0)
Monocytes Relative: 7 %
Neutro Abs: 2.9 10*3/uL (ref 1.4–6.5)
Neutrophils Relative %: 34 %
PLATELETS: 239 10*3/uL (ref 150–440)
RBC: 5.22 MIL/uL (ref 4.40–5.90)
RDW: 13 % (ref 11.5–14.5)
WBC: 8.3 10*3/uL (ref 3.8–10.6)

## 2017-01-22 LAB — COMPREHENSIVE METABOLIC PANEL
ALK PHOS: 91 U/L (ref 38–126)
ALT: 45 U/L (ref 17–63)
ANION GAP: 12 (ref 5–15)
AST: 45 U/L — ABNORMAL HIGH (ref 15–41)
Albumin: 5.2 g/dL — ABNORMAL HIGH (ref 3.5–5.0)
BUN: 7 mg/dL (ref 6–20)
CALCIUM: 9.9 mg/dL (ref 8.9–10.3)
CO2: 22 mmol/L (ref 22–32)
Chloride: 107 mmol/L (ref 101–111)
Creatinine, Ser: 1.1 mg/dL (ref 0.61–1.24)
GFR calc non Af Amer: >60 mL/min (ref >60)
GLUCOSE: 139 mg/dL — AB (ref 65–99)
Potassium: 3.7 mmol/L (ref 3.5–5.1)
Sodium: 141 mmol/L (ref 135–145)
TOTAL PROTEIN: 8.9 g/dL — AB (ref 6.5–8.1)
Total Bilirubin: 0.9 mg/dL (ref 0.3–1.2)

## 2017-01-22 MED ORDER — AZITHROMYCIN 250 MG PO TABS: 250.0000 mg | ORAL_TABLET | Freq: Every day | ORAL | 0 refills | Status: DC

## 2017-01-22 MED ORDER — IPRATROPIUM-ALBUTEROL 0.5-2.5 (3) MG/3ML IN SOLN
3.0000 mL | Freq: Once | RESPIRATORY_TRACT | Status: AC
  Administered 2017-01-22: 3 mL via RESPIRATORY_TRACT
  Filled 2017-01-22: qty 3

## 2017-01-22 MED ORDER — SODIUM CHLORIDE 0.9 % IV BOLUS (SEPSIS)
1000.0000 mL | Freq: Once | INTRAVENOUS | Status: AC
  Administered 2017-01-22: 1000 mL via INTRAVENOUS

## 2017-01-22 MED ORDER — METHYLPREDNISOLONE SODIUM SUCC 125 MG IJ SOLR
125.0000 mg | Freq: Once | INTRAMUSCULAR | Status: AC
  Administered 2017-01-22: 125 mg via INTRAVENOUS
  Filled 2017-01-22: qty 2

## 2017-01-22 MED ORDER — ALBUTEROL SULFATE HFA 108 (90 BASE) MCG/ACT IN AERS: 2.0000 | INHALATION_SPRAY | Freq: Four times a day (QID) | RESPIRATORY_TRACT | 2 refills | Status: DC | PRN

## 2017-01-22 MED ORDER — AZITHROMYCIN 500 MG PO TABS
500.0000 mg | ORAL_TABLET | Freq: Once | ORAL | Status: AC
  Administered 2017-01-22: 500 mg via ORAL
  Filled 2017-01-22: qty 1

## 2017-01-22 MED ORDER — ONDANSETRON HCL 4 MG/2ML IJ SOLN
4.0000 mg | Freq: Once | INTRAMUSCULAR | Status: AC
  Administered 2017-01-22: 4 mg via INTRAVENOUS
  Filled 2017-01-22: qty 2

## 2017-01-22 MED ORDER — PREDNISONE 20 MG PO TABS: 40.0000 mg | ORAL_TABLET | Freq: Every day | ORAL | 0 refills | Status: DC

## 2017-01-22 NOTE — ED Triage Notes (Signed)
Patient ambulatory to room 15 for triage with complaints of cough since 8 July. Constant cough with 1 x vomiting denies pain and no production of cough.  Pt reports chills/sweating unknown if fever. Pt reports taking Goody's powder approx  1845 before coming to ED.  Speaking in complete coherent sentences.

## 2017-01-22 NOTE — ED Provider Notes (Signed)
[facility] Emergency Department Provider Note  Time seen: 9:45 PM  I have reviewed the triage vital signs and the nursing notes.   HISTORY  Chief Complaint Cough and Emesis    HPI Jerome Murphy is a 26 y.o. male with a past medical history of asthma, seizure disorder, presents to the emergency department for 1 week of cough, congestion, subjective fever and shortness of breath. According to the patient for the past one week he has been coughing with a sore throat intermittent headache, intermittent subjective fever and chills. States he is not getting any sputum up with a cough. States he becomes very short of breath with minimal exertion. States he had a albuterol inhaler as a child but has not had one recently. Denies any chest pain. States nausea but denies abdominal pain.  Past Medical History:  Diagnosis Date  . Asthma   . Narcolepsy   . Seizures (HCC)     There are no active problems to display for this patient.   Past Surgical History:  Procedure Laterality Date  . CIRCUMCISION    . HERNIA REPAIR    . TONSILLECTOMY      Prior to Admission medications   Medication Sig Start Date End Date Taking? Authorizing Provider  ibuprofen (ADVIL,MOTRIN) 800 MG tablet Take 1 tablet (800 mg total) by mouth every 8 (eight) hours as needed. Patient not taking: Reported on 06/27/2016 06/27/16   Darci Current, MD  ketorolac (TORADOL) 10 MG tablet Take 1 tablet (10 mg total) by mouth every 6 (six) hours as needed. 12/12/16   Darci Current, MD    Allergies  Allergen Reactions  . Robitussin [Guaifenesin] Hives and Itching    History reviewed. No pertinent family history.  Social History Social History  Substance Use Topics  . Smoking status: Current Every Day Smoker    Packs/day: 0.50    Types: Cigarettes  . Smokeless tobacco: Never Used  . Alcohol use No     Comment: occasionallly    Review of Systems Constitutional: Subjective  fever/chills ENT mild sore throat Cardiovascular: Negative for chest pain. Respiratory: Positive shortness breath. Positive cough. Gastrointestinal: Negative for abdominal pain. Positive for nausea. Musculoskeletal: Negative for back pain. Neurological: Negative for headache All other ROS negative  ____________________________________________   PHYSICAL EXAM:  VITAL SIGNS: ED Triage Vitals  Enc Vitals Group     BP 01/22/17 2112 (!) 137/101     Pulse Rate 01/22/17 2112 (!) 110     Resp 01/22/17 2112 20     Temp 01/22/17 2112 99.1 F (37.3 C)     Temp Source 01/22/17 2112 Oral     SpO2 01/22/17 2112 96 %     Weight 01/22/17 2113 260 lb (117.9 kg)     Height 01/22/17 2113 6' (1.829 m)     Head Circumference --      Peak Flow --      Pain Score 01/22/17 2110 0     Pain Loc --      Pain Edu? --      Excl. in GC? --     Constitutional: Alert and oriented. Well appearing and in no distress. Eyes: Normal exam ENT   Head: Normocephalic and atraumatic.   Mouth/Throat: Mucous membranes are moist.Mild erythema without tonsillar hypertrophy. No tonsillar exudate. Cardiovascular: Normal rate, regular rhythm. No murmur Respiratory: Mild tachypnea, mild expiratory wheezes bilaterally. No obvious rales or rhonchi. Occasional cough during exam. Gastrointestinal: Soft and nontender. No distention.  Musculoskeletal: Nontender with normal range of motion in all extremities.  Neurologic:  Normal speech and language. No gross focal neurologic deficits Skin:  Skin is warm, dry and intact.  Psychiatric: Mood and affect are normal.   ____________________________________________    INITIAL IMPRESSION / ASSESSMENT AND PLAN / ED COURSE  Pertinent labs & imaging results that were available during my care of the patient were reviewed by me and considered in my medical decision making (see chart for details).  The patient presents to the emergency department for 1 week of cough,  shortness breath. Patient does have bilateral wheezes on exam. Chest x-ray is negative. We will treat with steroids and breathing treatments. Patient has a low-grade temperature of 99.1, states he took Goody's powder at home before coming to the ER, likely low-grade fever. Given the patient 1 week of symptoms now with fever we will cover with antibiotics and continue to closely monitor in the emergency department while awaiting labs.  Chest x-ray negative. Labs are largely within normal limits. We will discharge patient on Zithromax and steroids as well as albuterol inhaler. Patient will follow up with the primary care doctor and continue supportive care at home including Tylenol or ibuprofen as needed for fever and plenty of fluids.  ____________________________________________   FINAL CLINICAL IMPRESSION(S) / ED DIAGNOSES  Upper respiratory infection cough Reactive airway disease    Minna AntisPaduchowski, Lyndy Russman, MD 01/22/17 2215

## 2017-12-23 IMAGING — DX DG CHEST 2V
2 series · 2 of 2 positions shown · non-contrast
Comparison: 03/30/2015.

CLINICAL DATA: Intermittent palpitations for several months, worse
today. Smoker.

EXAM:
CHEST  2 VIEW

[w chest pa]
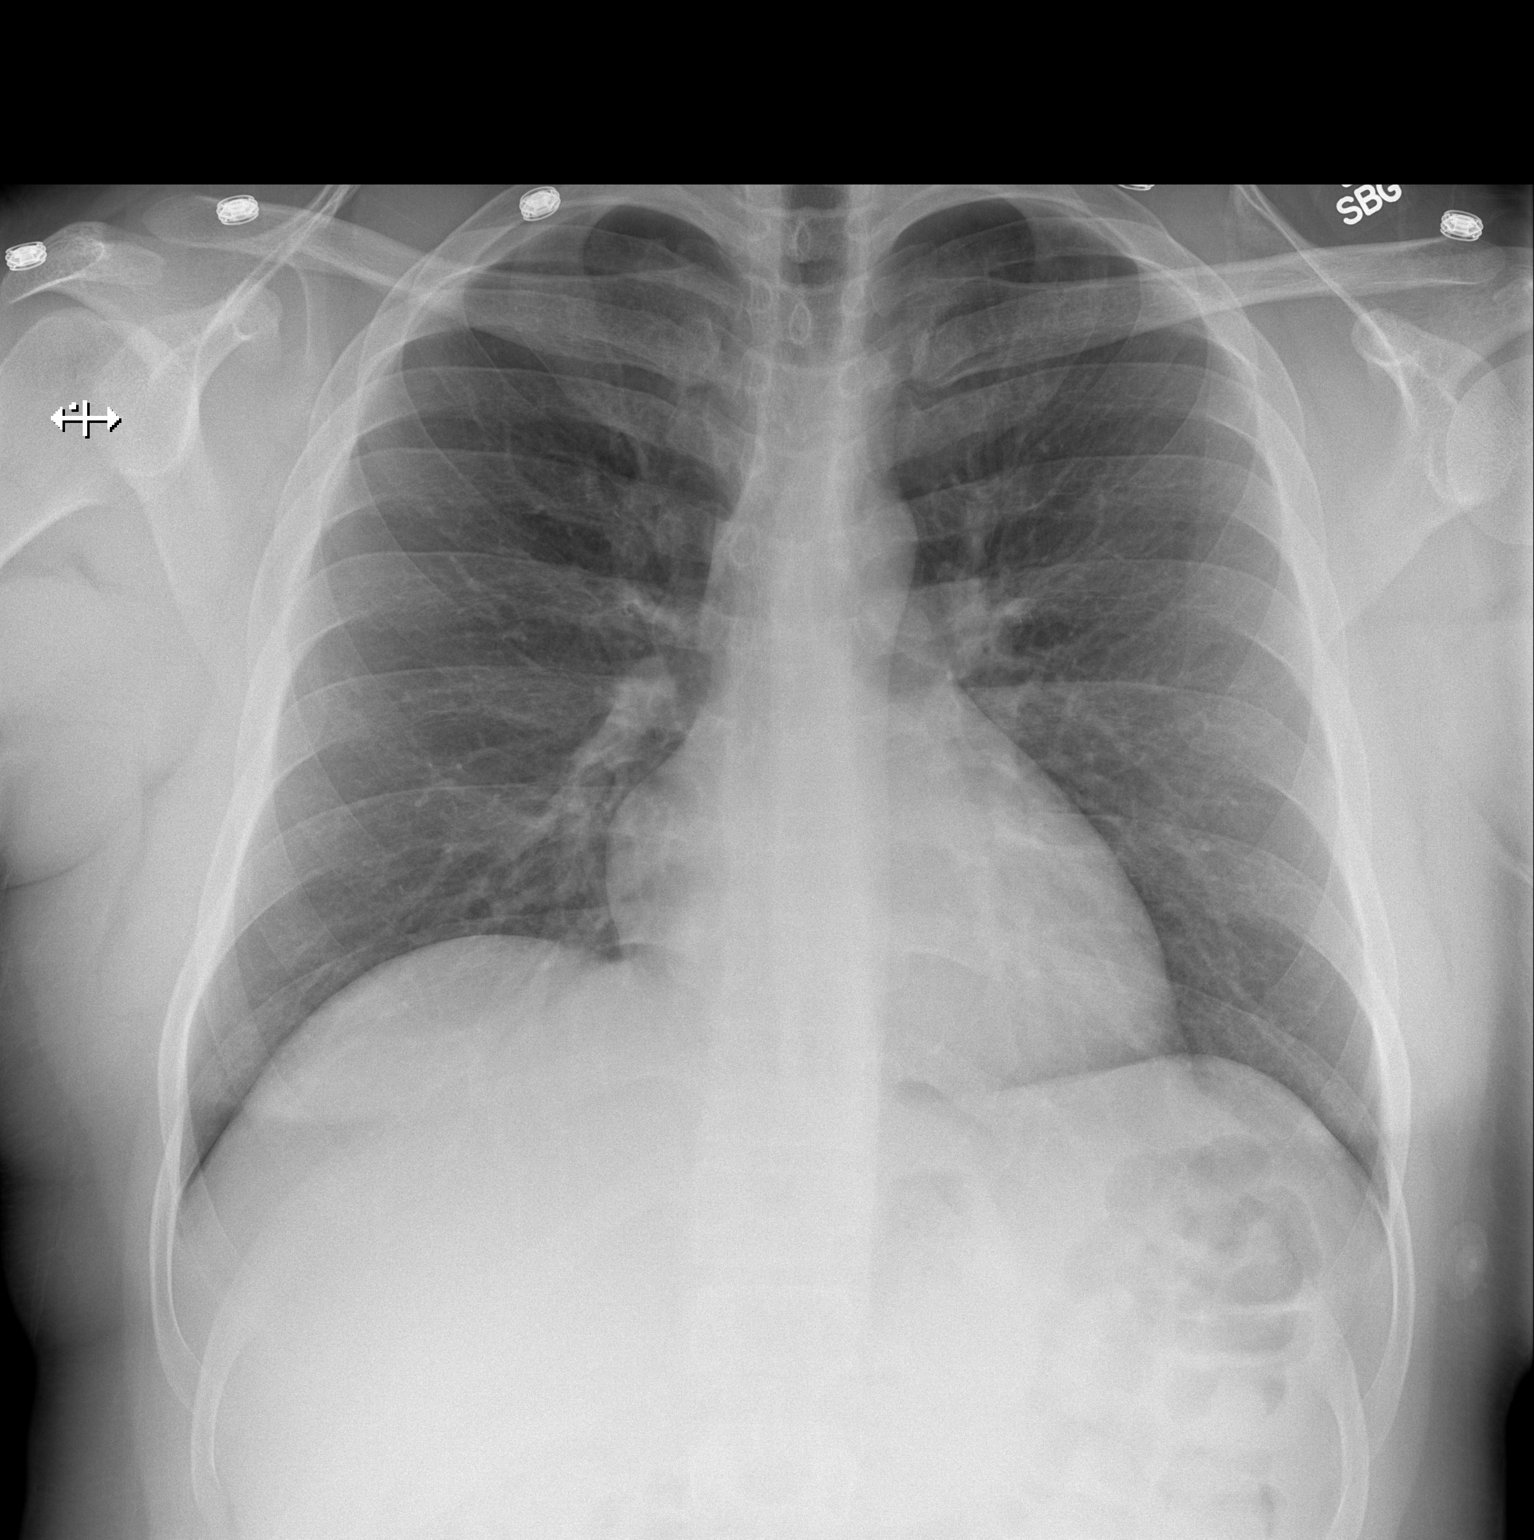

[w chest lat]
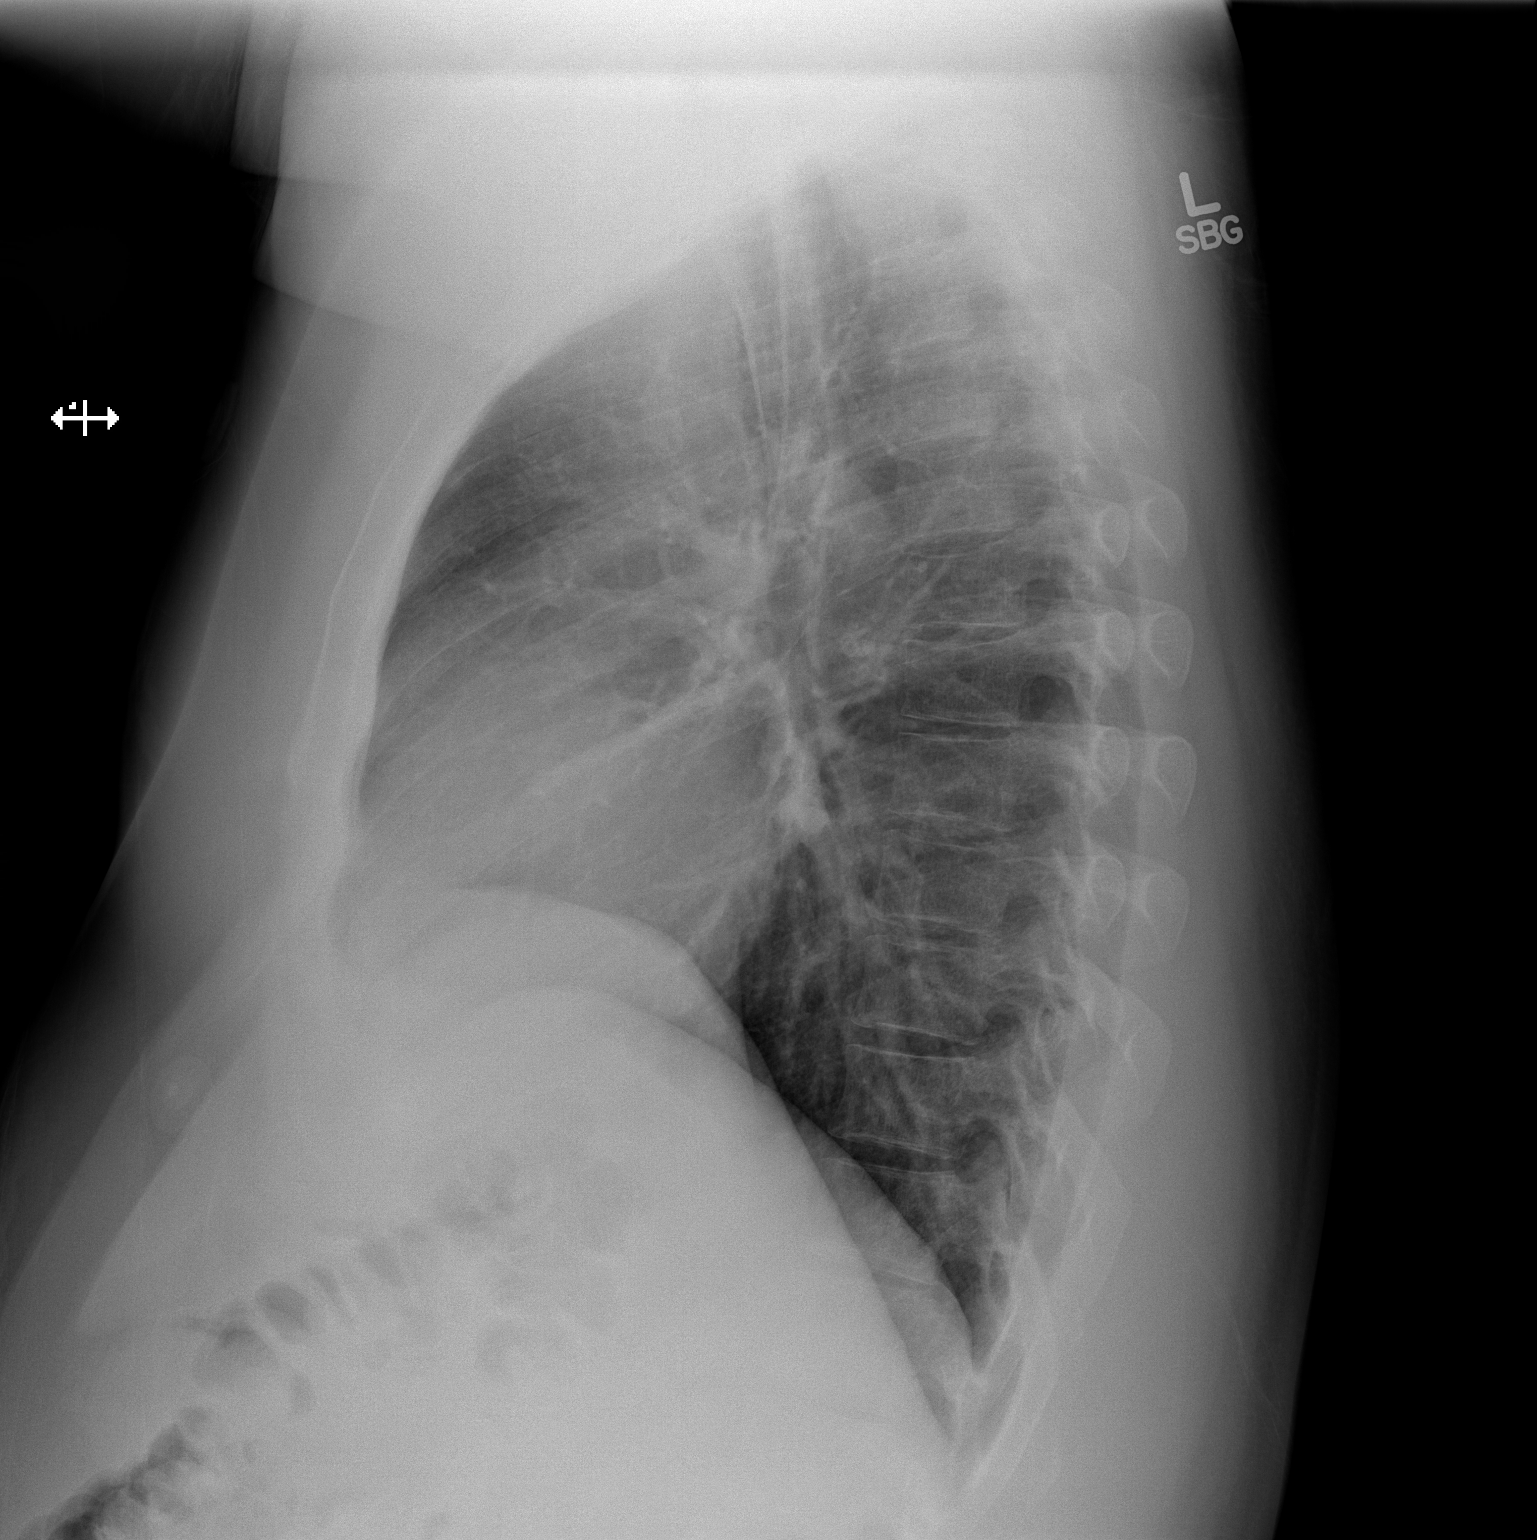

[2 of 2 positions shown; findings below may reference images not displayed]

FINDINGS: Normal sized heart. Clear lungs. Normal vascularity. Minimal central
peribronchial thickening. Normal appearing bones.
IMPRESSION: Minimal bronchitic changes.

## 2018-09-03 ENCOUNTER — Emergency Department (HOSPITAL_BASED_OUTPATIENT_CLINIC_OR_DEPARTMENT_OTHER)
Admission: EM | Admit: 2018-09-03 | Discharge: 2018-09-03 | Disposition: A | Discharge status: Home / Self Care | Payer: No Typology Code available for payment source | Attending: Emergency Medicine | Admitting: Emergency Medicine

## 2018-09-03 ENCOUNTER — Encounter (HOSPITAL_BASED_OUTPATIENT_CLINIC_OR_DEPARTMENT_OTHER): Payer: Self-pay | Admitting: Emergency Medicine

## 2018-09-03 ENCOUNTER — Other Ambulatory Visit: Payer: Self-pay

## 2018-09-03 DIAGNOSIS — Y92410 Unspecified street and highway as the place of occurrence of the external cause: Secondary | ICD-10-CM | POA: Insufficient documentation

## 2018-09-03 DIAGNOSIS — Z79899 Other long term (current) drug therapy: Secondary | ICD-10-CM | POA: Insufficient documentation

## 2018-09-03 DIAGNOSIS — F17210 Nicotine dependence, cigarettes, uncomplicated: Secondary | ICD-10-CM | POA: Insufficient documentation

## 2018-09-03 DIAGNOSIS — Y999 Unspecified external cause status: Secondary | ICD-10-CM | POA: Insufficient documentation

## 2018-09-03 DIAGNOSIS — Y9389 Activity, other specified: Secondary | ICD-10-CM | POA: Insufficient documentation

## 2018-09-03 DIAGNOSIS — M7918 Myalgia, other site: Secondary | ICD-10-CM | POA: Insufficient documentation

## 2018-09-03 DIAGNOSIS — J45909 Unspecified asthma, uncomplicated: Secondary | ICD-10-CM | POA: Insufficient documentation

## 2018-09-03 MED ORDER — METHOCARBAMOL 500 MG PO TABS: 500.0000 mg | ORAL_TABLET | Freq: Two times a day (BID) | ORAL | 0 refills | Status: DC

## 2018-09-03 MED ORDER — ACETAMINOPHEN 500 MG PO TABS
1000.0000 mg | ORAL_TABLET | Freq: Once | ORAL | Status: AC
  Administered 2018-09-03: 1000 mg via ORAL
  Filled 2018-09-03: qty 2

## 2018-09-03 NOTE — ED Notes (Signed)
ED Provider at bedside. 
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
## 2018-09-03 NOTE — Discharge Instructions (Signed)
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
## 2018-11-25 ENCOUNTER — Encounter: Payer: Self-pay | Admitting: Emergency Medicine

## 2018-11-25 ENCOUNTER — Emergency Department
Admission: EM | Admit: 2018-11-25 | Discharge: 2018-11-25 | Disposition: A | Discharge status: Home / Self Care | Payer: No Typology Code available for payment source | Attending: Emergency Medicine | Admitting: Emergency Medicine

## 2018-11-25 ENCOUNTER — Other Ambulatory Visit: Payer: Self-pay

## 2018-11-25 DIAGNOSIS — R739 Hyperglycemia, unspecified: Secondary | ICD-10-CM

## 2018-11-25 DIAGNOSIS — E1165 Type 2 diabetes mellitus with hyperglycemia: Secondary | ICD-10-CM | POA: Insufficient documentation

## 2018-11-25 DIAGNOSIS — J45909 Unspecified asthma, uncomplicated: Secondary | ICD-10-CM | POA: Insufficient documentation

## 2018-11-25 DIAGNOSIS — F17210 Nicotine dependence, cigarettes, uncomplicated: Secondary | ICD-10-CM | POA: Insufficient documentation

## 2018-11-25 DIAGNOSIS — E139 Other specified diabetes mellitus without complications: Secondary | ICD-10-CM

## 2018-11-25 LAB — URINALYSIS, COMPLETE (UACMP) WITH MICROSCOPIC
Bacteria, UA: NONE SEEN
Bilirubin Urine: NEGATIVE
Glucose, UA: >=500 mg/dL — AB
Hgb urine dipstick: NEGATIVE
Ketones, ur: 5 mg/dL — AB
Leukocytes,Ua: NEGATIVE
Nitrite: NEGATIVE
Protein, ur: NEGATIVE mg/dL
Specific Gravity, Urine: 1.033 — ABNORMAL HIGH (ref 1.005–1.030)
Squamous Epithelial / LPF: NONE SEEN (ref 0–5)
pH: 5 (ref 5.0–8.0)

## 2018-11-25 LAB — BASIC METABOLIC PANEL
Anion gap: 12 (ref 5–15)
BUN: 11 mg/dL (ref 6–20)
CO2: 25 mmol/L (ref 22–32)
Calcium: 9.4 mg/dL (ref 8.9–10.3)
Chloride: 90 mmol/L — ABNORMAL LOW (ref 98–111)
Creatinine, Ser: 1.13 mg/dL (ref 0.61–1.24)
GFR calc Af Amer: >60 mL/min (ref >60)
GFR calc non Af Amer: >60 mL/min (ref >60)
Glucose, Bld: 638 mg/dL (ref 70–99)
Potassium: 4.3 mmol/L (ref 3.5–5.1)
Sodium: 127 mmol/L — ABNORMAL LOW (ref 135–145)

## 2018-11-25 LAB — CBC
HCT: 45.9 % (ref 39.0–52.0)
Hemoglobin: 16.6 g/dL (ref 13.0–17.0)
MCH: 31 pg (ref 26.0–34.0)
MCHC: 36.2 g/dL — ABNORMAL HIGH (ref 30.0–36.0)
MCV: 85.6 fL (ref 80.0–100.0)
Platelets: 202 10*3/uL (ref 150–400)
RBC: 5.36 MIL/uL (ref 4.22–5.81)
RDW: 11.4 % — ABNORMAL LOW (ref 11.5–15.5)
WBC: 8.7 10*3/uL (ref 4.0–10.5)
nRBC: 0 % (ref 0.0–0.2)

## 2018-11-25 LAB — BLOOD GAS, VENOUS
Acid-Base Excess: 2.4 mmol/L — ABNORMAL HIGH (ref 0.0–2.0)
Bicarbonate: 28.8 mmol/L — ABNORMAL HIGH (ref 20.0–28.0)
O2 Saturation: 22.1 %
Patient temperature: 37
pCO2, Ven: 51 mmHg (ref 44.0–60.0)
pH, Ven: 7.36 (ref 7.250–7.430)
pO2, Ven: <31 mmHg — CL (ref 32.0–45.0)

## 2018-11-25 LAB — GLUCOSE, CAPILLARY
Glucose-Capillary: 592 mg/dL (ref 70–99)
Glucose-Capillary: 426 mg/dL — ABNORMAL HIGH (ref 70–99)
Glucose-Capillary: 341 mg/dL — ABNORMAL HIGH (ref 70–99)

## 2018-11-25 MED ORDER — SODIUM CHLORIDE 0.9 % IV BOLUS
1000.0000 mL | Freq: Once | INTRAVENOUS | Status: AC
  Administered 2018-11-25: 1000 mL via INTRAVENOUS

## 2018-11-25 MED ORDER — METFORMIN HCL 500 MG PO TABS: 500.0000 mg | ORAL_TABLET | Freq: Two times a day (BID) | ORAL | 2 refills | Status: DC

## 2018-11-25 MED ORDER — LIVING WELL WITH DIABETES BOOK
Freq: Once | Status: DC
  Filled 2018-11-25: qty 1

## 2018-11-25 NOTE — ED Triage Notes (Signed)
Pt via pov from home with hyperglycemia. He reports 2 days of excessive urination/thirst and exhaustion. He used a family member's glucometer to check his glucose level and it was over 400. Pt states he feels "weak and drained." Pt alert & oriented with NAD noted.

## 2018-11-25 NOTE — Discharge Instructions (Addendum)
Please seek medical attention for any high fevers, chest pain, shortness of breath, change in behavior, persistent vomiting, bloody stool or any other new or concerning symptoms.  

## 2018-11-25 NOTE — ED Provider Notes (Signed)
[facility] Emergency Department Provider Note   ____________________________________________   I have reviewed the triage vital signs and the nursing notes.   HISTORY  Chief Complaint Hyperglycemia   History limited by: Not Limited   HPI Jerome Murphy is a 28 y.o. male who presents to the emergency department today because of concern for high blood sugar. The patient states that he used a family members kit to test his blood sugar because he had been feeling weak and had noticed increased thirst and urination over the past week or so. Denies any personal history of diabetes but states it does run in his family. Did receive IV fluids in triage and does feel better.   Records reviewed. Per medical record review patient has a history of asthma, seizures.   Past Medical History:  Diagnosis Date  . Asthma   . Narcolepsy   . Seizures ([place])     There are no active problems to display for this patient.   Past Surgical History:  Procedure Laterality Date  . CIRCUMCISION    . HERNIA REPAIR    . TONSILLECTOMY      Prior to Admission medications   Medication Sig Start Date End Date Taking? Authorizing Provider  albuterol (PROVENTIL HFA;VENTOLIN HFA) 108 (90 Base) MCG/ACT inhaler Inhale 2 puffs into the lungs every 6 (six) hours as needed for wheezing or shortness of breath. Patient not taking: Reported on 11/25/2018 01/22/17   Harvest Dark, MD  azithromycin (ZITHROMAX) 250 MG tablet Take 1 tablet (250 mg total) by mouth daily. Patient not taking: Reported on 11/25/2018 01/22/17   Harvest Dark, MD  ibuprofen (ADVIL,MOTRIN) 800 MG tablet Take 1 tablet (800 mg total) by mouth every 8 (eight) hours as needed. Patient not taking: Reported on 06/27/2016 06/27/16   Gregor Hams, MD  ketorolac (TORADOL) 10 MG tablet Take 1 tablet (10 mg total) by mouth every 6 (six) hours as needed. Patient not taking: Reported on 11/25/2018 12/12/16   Gregor Hams, MD  methocarbamol (ROBAXIN) 500 MG tablet Take 1 tablet (500 mg total) by mouth 2 (two) times daily. Patient not taking: Reported on 11/25/2018 09/03/18   Providence Lanius A, PA-C  predniSONE (DELTASONE) 20 MG tablet Take 2 tablets (40 mg total) by mouth daily. Patient not taking: Reported on 11/25/2018 01/22/17   Harvest Dark, MD    Allergies Robitussin [guaifenesin]  History reviewed. No pertinent family history.  Social History Social History   Tobacco Use  . Smoking status: Current Every Day Smoker    Packs/day: 0.25    Types: Cigarettes  . Smokeless tobacco: Never Used  Substance Use Topics  . Alcohol use: No    Comment: occasionallly  . Drug use: No    Review of Systems Constitutional: No fever/chills Eyes: No visual changes. ENT: No sore throat. Cardiovascular: Denies chest pain. Respiratory: Denies shortness of breath. Gastrointestinal: No abdominal pain.  No nausea, no vomiting.  No diarrhea.  Increased thirst.  Genitourinary: Increased urination. Musculoskeletal: Negative for back pain. Skin: Negative for rash. Neurological: Negative for headaches, focal weakness or numbness.  ____________________________________________   PHYSICAL EXAM:  VITAL SIGNS: ED Triage Vitals  Enc Vitals Group     BP 11/25/18 1605 (!) 147/81     Pulse Rate 11/25/18 1605 (!) 117     Resp 11/25/18 1605 20     Temp 11/25/18 1605 97.8 F (36.6 C)     Temp Source 11/25/18 1605 Oral     SpO2 11/25/18  1605 96 %     Weight 11/25/18 1606 280 lb (127 kg)     Height 11/25/18 1606 _0  (1.803 m)     Head Circumference --      Peak Flow --      Pain Score 11/25/18 1606 0   Constitutional: Alert and oriented.  Eyes: Conjunctivae are normal.  ENT      Head: Normocephalic and atraumatic.      Nose: No congestion/rhinnorhea.      Mouth/Throat: Mucous membranes are moist.      Neck: No stridor. Hematological/Lymphatic/Immunilogical: No cervical  lymphadenopathy. Cardiovascular: Normal rate, regular rhythm.  No murmurs, rubs, or gallops. Respiratory: Normal respiratory effort without tachypnea nor retractions. Breath sounds are clear and equal bilaterally. No wheezes/rales/rhonchi. Gastrointestinal: Soft and non tender. No rebound. No guarding.  Genitourinary: Deferred Musculoskeletal: Normal range of motion in all extremities. No lower extremity edema. Neurologic:  Normal speech and language. No gross focal neurologic deficits are appreciated.  Skin:  Skin is warm, dry and intact. No rash noted. Psychiatric: Mood and affect are normal. Speech and behavior are normal. Patient exhibits appropriate insight and judgment.  ____________________________________________    LABS (pertinent positives/negatives)  UA clear, >500, ketones 5 BMP na 127, k 4.3, glu 638, anion gap 12 CBC wbc 8.7, hgb 16.6, plt 202 VBG pH 7.36  ____________________________________________   EKG  None  ____________________________________________    RADIOLOGY  None  ____________________________________________   PROCEDURES  Procedures  ____________________________________________   INITIAL IMPRESSION / ASSESSMENT AND PLAN / ED COURSE  Pertinent labs & imaging results that were available during my care of the patient were reviewed by me and considered in my medical decision making (see chart for details).   Patient presented to the emergency department today because of concern for high blood sugar, increased urination and thirst. Blood work is consistent with diabetes. No evidence of DKA. Discussed this with the patient. He did feel better after 2 liters of IVFs and sugars did come down. At this point the patient did state he would like to be discharged. I think this is reasonable. Discussed with patient importance of diet/exercise. Will prescribe metformin. Discussed expected GI upset and course. Discussed importance of PCP follow  up.  ____________________________________________   FINAL CLINICAL IMPRESSION(S) / ED DIAGNOSES  Final diagnoses:  Hyperglycemia  Other specified diabetes mellitus without complication, without long-term current use of insulin ([place])     Note: This dictation was prepared with Dragon dictation. Any transcriptional errors that result from this process are unintentional     Nance Pear, MD 11/25/18 2103

## 2019-05-30 ENCOUNTER — Other Ambulatory Visit: Payer: Self-pay

## 2019-05-30 ENCOUNTER — Encounter: Payer: Self-pay | Admitting: Emergency Medicine

## 2019-05-30 ENCOUNTER — Emergency Department
Admission: EM | Admit: 2019-05-30 | Discharge: 2019-05-30 | Disposition: A | Discharge status: Home / Self Care | Payer: Self-pay | Attending: Emergency Medicine | Admitting: Emergency Medicine

## 2019-05-30 DIAGNOSIS — L0291 Cutaneous abscess, unspecified: Secondary | ICD-10-CM

## 2019-05-30 DIAGNOSIS — J45909 Unspecified asthma, uncomplicated: Secondary | ICD-10-CM | POA: Insufficient documentation

## 2019-05-30 DIAGNOSIS — E119 Type 2 diabetes mellitus without complications: Secondary | ICD-10-CM | POA: Insufficient documentation

## 2019-05-30 DIAGNOSIS — F17210 Nicotine dependence, cigarettes, uncomplicated: Secondary | ICD-10-CM | POA: Insufficient documentation

## 2019-05-30 DIAGNOSIS — Z7984 Long term (current) use of oral hypoglycemic drugs: Secondary | ICD-10-CM | POA: Insufficient documentation

## 2019-05-30 DIAGNOSIS — L02211 Cutaneous abscess of abdominal wall: Secondary | ICD-10-CM | POA: Insufficient documentation

## 2019-05-30 HISTORY — DX: Type 2 diabetes mellitus without complications: E11.9

## 2019-05-30 LAB — CBC WITH DIFFERENTIAL/PLATELET
Abs Immature Granulocytes: 0.04 10*3/uL (ref 0.00–0.07)
Basophils Absolute: 0.1 10*3/uL (ref 0.0–0.1)
Basophils Relative: 1 %
Eosinophils Absolute: 0.7 10*3/uL — ABNORMAL HIGH (ref 0.0–0.5)
Eosinophils Relative: 9 %
HCT: 40.9 % (ref 39.0–52.0)
Hemoglobin: 14.3 g/dL (ref 13.0–17.0)
Immature Granulocytes: 1 %
Lymphocytes Relative: 28 %
Lymphs Abs: 2.2 10*3/uL (ref 0.7–4.0)
MCH: 31.2 pg (ref 26.0–34.0)
MCHC: 35 g/dL (ref 30.0–36.0)
MCV: 89.3 fL (ref 80.0–100.0)
Monocytes Absolute: 0.6 10*3/uL (ref 0.1–1.0)
Monocytes Relative: 8 %
Neutro Abs: 4.4 10*3/uL (ref 1.7–7.7)
Neutrophils Relative %: 53 %
Platelets: 175 10*3/uL (ref 150–400)
RBC: 4.58 MIL/uL (ref 4.22–5.81)
RDW: 13.2 % (ref 11.5–15.5)
WBC: 8 10*3/uL (ref 4.0–10.5)
nRBC: 0 % (ref 0.0–0.2)

## 2019-05-30 LAB — COMPREHENSIVE METABOLIC PANEL
ALT: 11 U/L (ref 0–44)
AST: 10 U/L — ABNORMAL LOW (ref 15–41)
Albumin: 4.1 g/dL (ref 3.5–5.0)
Alkaline Phosphatase: 93 U/L (ref 38–126)
Anion gap: 16 — ABNORMAL HIGH (ref 5–15)
BUN: 9 mg/dL (ref 6–20)
CO2: 15 mmol/L — ABNORMAL LOW (ref 22–32)
Calcium: 9 mg/dL (ref 8.9–10.3)
Chloride: 102 mmol/L (ref 98–111)
Creatinine, Ser: 0.8 mg/dL (ref 0.61–1.24)
GFR calc Af Amer: >60 mL/min (ref >60)
GFR calc non Af Amer: >60 mL/min (ref >60)
Glucose, Bld: 371 mg/dL — ABNORMAL HIGH (ref 70–99)
Potassium: 4 mmol/L (ref 3.5–5.1)
Sodium: 133 mmol/L — ABNORMAL LOW (ref 135–145)
Total Bilirubin: 1.6 mg/dL — ABNORMAL HIGH (ref 0.3–1.2)
Total Protein: 7.9 g/dL (ref 6.5–8.1)

## 2019-05-30 LAB — LACTIC ACID, PLASMA: Lactic Acid, Venous: 0.7 mmol/L (ref 0.5–1.9)

## 2019-05-30 MED ORDER — CLINDAMYCIN HCL 300 MG PO CAPS: 300.0000 mg | ORAL_CAPSULE | Freq: Three times a day (TID) | ORAL | 0 refills | Status: AC

## 2019-05-30 MED ORDER — TRAMADOL HCL 50 MG PO TABS: 50.0000 mg | ORAL_TABLET | Freq: Four times a day (QID) | ORAL | 0 refills | Status: DC | PRN

## 2019-05-30 MED ORDER — CLINDAMYCIN PHOSPHATE 600 MG/50ML IV SOLN
600.0000 mg | Freq: Once | INTRAVENOUS | Status: AC
  Administered 2019-05-30: 12:00:00 600 mg via INTRAVENOUS
  Filled 2019-05-30: qty 50

## 2019-05-30 NOTE — Discharge Instructions (Addendum)
Follow discharge care instructions and take medication as directed. 

## 2019-05-30 NOTE — ED Provider Notes (Signed)
[facility] Emergency Department Provider Note   ____________________________________________   First MD Initiated Contact with Patient 05/30/19 1139     (approximate)  I have reviewed the triage vital signs and the nursing notes.   HISTORY  Chief Complaint Abscess    HPI Jerome Murphy is a 28 y.o. male patient presents with abscess to the right lower abdomen for 2 to 3 days.  Patient said lesion has ruptured and is having drainage.  Patient denies fever associated complaint.  Patient is cover area with a large Band-Aid.  Patient stated no pain unless pressure is applied to the area.  No other palliative measure for complaint.         Past Medical History:  Diagnosis Date  . Asthma   . Diabetes mellitus without complication ([place])   . Narcolepsy   . Seizures ([place])     There are no active problems to display for this patient.   Past Surgical History:  Procedure Laterality Date  . CIRCUMCISION    . HERNIA REPAIR    . TONSILLECTOMY      Prior to Admission medications   Medication Sig Start Date End Date Taking? Authorizing Provider  clindamycin (CLEOCIN) 300 MG capsule Take 1 capsule (300 mg total) by mouth 3 (three) times daily for 10 days. 05/30/19 06/09/19  Sable Feil, PA-C  metFORMIN (GLUCOPHAGE) 500 MG tablet Take 1 tablet (500 mg total) by mouth 2 (two) times daily with a meal. 11/25/18 11/25/19  Nance Pear, MD  traMADol (ULTRAM) 50 MG tablet Take 1 tablet (50 mg total) by mouth every 6 (six) hours as needed for moderate pain. 05/30/19   Sable Feil, PA-C    Allergies Robitussin [guaifenesin]  No family history on file.  Social History Social History   Tobacco Use  . Smoking status: Current Every Day Smoker    Packs/day: 0.25    Types: Cigarettes  . Smokeless tobacco: Never Used  Substance Use Topics  . Alcohol use: No    Comment: occasionallly  . Drug use: No    Review of Systems  Constitutional: No  fever/chills Eyes: No visual changes. ENT: No sore throat. Cardiovascular: Denies chest pain. Respiratory: Denies shortness of breath. Gastrointestinal: No abdominal pain.  No nausea, no vomiting.  No diarrhea.  No constipation. Genitourinary: Negative for dysuria. Musculoskeletal: Negative for back pain. Skin: Negative for rash. Neurological: Negative for headaches, focal weakness or numbness. Endocrine: Diabetes Allergic/Immunilogical: Robitussin ____________________________________________   PHYSICAL EXAM:  VITAL SIGNS: ED Triage Vitals  Enc Vitals Group     BP 05/30/19 1114 (!) 142/86     Pulse Rate 05/30/19 1114 98     Resp 05/30/19 1114 20     Temp 05/30/19 1114 98.7 F (37.1 C)     Temp Source 05/30/19 1114 Oral     SpO2 05/30/19 1114 97 %     Weight 05/30/19 1115 280 lb (127 kg)     Height 05/30/19 1115 6' (1.829 m)     Head Circumference --      Peak Flow --      Pain Score 05/30/19 1115 0     Pain Loc --      Pain Edu? --      Excl. in [place]? --     Constitutional: Alert and oriented. Well appearing and in no acute distress. Hematological/Lymphatic/Immunilogical: No inguinal lymphadenopathy. Cardiovascular: Normal rate, regular rhythm. Grossly normal heart sounds.  Good peripheral circulation. Respiratory: Normal respiratory effort.  No retractions. Lungs CTAB. Skin: Ruptured abscess right lower abdomen. Psychiatric: Mood and affect are normal. Speech and behavior are normal.  ____________________________________________   LABS (all labs ordered are listed, but only abnormal results are displayed)  Labs Reviewed  COMPREHENSIVE METABOLIC PANEL - Abnormal; Notable for the following components:      Result Value   Sodium 133 (*)    CO2 15 (*)    Glucose, Bld 371 (*)    AST 10 (*)    Total Bilirubin 1.6 (*)    Anion gap 16 (*)    All other components within normal limits  CBC WITH DIFFERENTIAL/PLATELET - Abnormal; Notable for the following components:    Eosinophils Absolute 0.7 (*)    All other components within normal limits  LACTIC ACID, PLASMA  LACTIC ACID, PLASMA   ____________________________________________  EKG   ____________________________________________  RADIOLOGY  ED MD interpretation:    Official radiology report(s): No results found.  ____________________________________________   PROCEDURES  Procedure(s) performed (including Critical Care):  Procedures   ____________________________________________   INITIAL IMPRESSION / ASSESSMENT AND PLAN / ED COURSE  As part of my medical decision making, I reviewed the following data within the electronic MEDICAL RECORD NUMBER         Jerome Murphy was evaluated in Emergency Department on 05/30/2019 for the symptoms described in the history of present illness. He was evaluated in the context of the global COVID-19 pandemic, which necessitated consideration that the patient might be at risk for infection with the SARS-CoV-2 virus that causes COVID-19. Institutional protocols and algorithms that pertain to the evaluation of patients at risk for COVID-19 are in a state of rapid change based on information released by regulatory bodies including the CDC and federal and state organizations. These policies and algorithms were followed during the patient's care in the ED.  Patient presents for ruptured abscess right lower abdomen.  Patient given IV antibiotics consisting of clindamycin.  Patient given discharge care instruction and advised to continue medication oral base and follow-up if no improvement or worsening of complaint.      ____________________________________________   FINAL CLINICAL IMPRESSION(S) / ED DIAGNOSES  Final diagnoses:  Abscess     ED Discharge Orders         Ordered    clindamycin (CLEOCIN) 300 MG capsule  3 times daily     05/30/19 1239    traMADol (ULTRAM) 50 MG tablet  Every 6 hours PRN     05/30/19 1239           Note:  This  document was prepared using Dragon voice recognition software and may include unintentional dictation errors.    Joni Reining, PA-C 05/30/19 1244    Emily Filbert, MD 05/30/19 1339

## 2019-05-30 NOTE — ED Triage Notes (Signed)
Pt reports abscess on his abd for the past 2-3 days that he thinks is infected. Pt reports painful.

## 2019-05-30 NOTE — ED Notes (Signed)
See triage note  Presents with a possible abscess area to right side of abd  States noticed area about 3 days ago

## 2019-08-04 ENCOUNTER — Encounter: Payer: Self-pay | Admitting: *Deleted

## 2019-08-04 ENCOUNTER — Inpatient Hospital Stay
Admission: EM | Admit: 2019-08-04 | Discharge: 2019-08-06 | DRG: 639 | Disposition: A | Discharge status: Home / Self Care | Payer: Medicaid Other | Attending: Internal Medicine | Admitting: Internal Medicine

## 2019-08-04 ENCOUNTER — Other Ambulatory Visit: Payer: Self-pay

## 2019-08-04 DIAGNOSIS — F17210 Nicotine dependence, cigarettes, uncomplicated: Secondary | ICD-10-CM | POA: Diagnosis present

## 2019-08-04 DIAGNOSIS — Z7984 Long term (current) use of oral hypoglycemic drugs: Secondary | ICD-10-CM

## 2019-08-04 DIAGNOSIS — Z9114 Patient's other noncompliance with medication regimen: Secondary | ICD-10-CM

## 2019-08-04 DIAGNOSIS — Z91148 Patient's other noncompliance with medication regimen for other reason: Secondary | ICD-10-CM

## 2019-08-04 DIAGNOSIS — E1110 Type 2 diabetes mellitus with ketoacidosis without coma: Secondary | ICD-10-CM

## 2019-08-04 DIAGNOSIS — J45909 Unspecified asthma, uncomplicated: Secondary | ICD-10-CM | POA: Diagnosis present

## 2019-08-04 DIAGNOSIS — E860 Dehydration: Secondary | ICD-10-CM | POA: Diagnosis present

## 2019-08-04 DIAGNOSIS — Z20822 Contact with and (suspected) exposure to covid-19: Secondary | ICD-10-CM | POA: Diagnosis present

## 2019-08-04 LAB — BASIC METABOLIC PANEL
Anion gap: 18 — ABNORMAL HIGH (ref 5–15)
BUN: 11 mg/dL (ref 6–20)
CO2: 13 mmol/L — ABNORMAL LOW (ref 22–32)
Calcium: 9.3 mg/dL (ref 8.9–10.3)
Chloride: 101 mmol/L (ref 98–111)
Creatinine, Ser: 1.09 mg/dL (ref 0.61–1.24)
GFR calc Af Amer: >60 mL/min (ref >60)
GFR calc non Af Amer: >60 mL/min (ref >60)
Glucose, Bld: 432 mg/dL — ABNORMAL HIGH (ref 70–99)
Potassium: 4.4 mmol/L (ref 3.5–5.1)
Sodium: 132 mmol/L — ABNORMAL LOW (ref 135–145)

## 2019-08-04 LAB — CBC
HCT: 43 % (ref 39.0–52.0)
Hemoglobin: 15.3 g/dL (ref 13.0–17.0)
MCH: 31.7 pg (ref 26.0–34.0)
MCHC: 35.6 g/dL (ref 30.0–36.0)
MCV: 89.2 fL (ref 80.0–100.0)
Platelets: 179 10*3/uL (ref 150–400)
RBC: 4.82 MIL/uL (ref 4.22–5.81)
RDW: 11.8 % (ref 11.5–15.5)
WBC: 6.7 10*3/uL (ref 4.0–10.5)
nRBC: 0 % (ref 0.0–0.2)

## 2019-08-04 LAB — URINALYSIS, COMPLETE (UACMP) WITH MICROSCOPIC
Bacteria, UA: NONE SEEN
Bilirubin Urine: NEGATIVE
Glucose, UA: >=500 mg/dL — AB
Hgb urine dipstick: NEGATIVE
Ketones, ur: 80 mg/dL — AB
Leukocytes,Ua: NEGATIVE
Nitrite: NEGATIVE
Protein, ur: 30 mg/dL — AB
Specific Gravity, Urine: 1.035 — ABNORMAL HIGH (ref 1.005–1.030)
pH: 5 (ref 5.0–8.0)

## 2019-08-04 LAB — BLOOD GAS, VENOUS
Acid-base deficit: 11.5 mmol/L — ABNORMAL HIGH (ref 0.0–2.0)
Bicarbonate: 13.1 mmol/L — ABNORMAL LOW (ref 20.0–28.0)
O2 Saturation: 95.7 %
Patient temperature: 37
pCO2, Ven: 26 mmHg — ABNORMAL LOW (ref 44.0–60.0)
pH, Ven: 7.31 (ref 7.250–7.430)
pO2, Ven: 87 mmHg — ABNORMAL HIGH (ref 32.0–45.0)

## 2019-08-04 LAB — GLUCOSE, CAPILLARY: Glucose-Capillary: 408 mg/dL — ABNORMAL HIGH (ref 70–99)

## 2019-08-04 MED ORDER — SODIUM CHLORIDE 0.9 % IV BOLUS
1000.0000 mL | Freq: Once | INTRAVENOUS | Status: AC
  Administered 2019-08-04: 1000 mL via INTRAVENOUS

## 2019-08-04 MED ORDER — SODIUM CHLORIDE 0.9 % IV BOLUS
1000.0000 mL | Freq: Once | INTRAVENOUS | Status: AC
  Administered 2019-08-04: 21:00:00 1000 mL via INTRAVENOUS

## 2019-08-04 NOTE — ED Triage Notes (Addendum)
Patient c/o feeling tired "for some time. " Patient feels like he is "dry" yet he drinks a 24-case of water in two days. Patient c/o losing weight recently without trying. Patient states he stopped Metformin a year ago because it gave him palpitations.

## 2019-08-05 ENCOUNTER — Encounter: Payer: Self-pay | Admitting: Internal Medicine

## 2019-08-05 DIAGNOSIS — E1110 Type 2 diabetes mellitus with ketoacidosis without coma: Secondary | ICD-10-CM

## 2019-08-05 DIAGNOSIS — Z9114 Patient's other noncompliance with medication regimen: Secondary | ICD-10-CM

## 2019-08-05 DIAGNOSIS — Z91148 Patient's other noncompliance with medication regimen for other reason: Secondary | ICD-10-CM

## 2019-08-05 LAB — GLUCOSE, CAPILLARY
Glucose-Capillary: 117 mg/dL — ABNORMAL HIGH (ref 70–99)
Glucose-Capillary: 137 mg/dL — ABNORMAL HIGH (ref 70–99)
Glucose-Capillary: 139 mg/dL — ABNORMAL HIGH (ref 70–99)
Glucose-Capillary: 147 mg/dL — ABNORMAL HIGH (ref 70–99)
Glucose-Capillary: 151 mg/dL — ABNORMAL HIGH (ref 70–99)
Glucose-Capillary: 160 mg/dL — ABNORMAL HIGH (ref 70–99)
Glucose-Capillary: 167 mg/dL — ABNORMAL HIGH (ref 70–99)
Glucose-Capillary: 176 mg/dL — ABNORMAL HIGH (ref 70–99)
Glucose-Capillary: 178 mg/dL — ABNORMAL HIGH (ref 70–99)
Glucose-Capillary: 195 mg/dL — ABNORMAL HIGH (ref 70–99)
Glucose-Capillary: 238 mg/dL — ABNORMAL HIGH (ref 70–99)
Glucose-Capillary: 250 mg/dL — ABNORMAL HIGH (ref 70–99)
Glucose-Capillary: 301 mg/dL — ABNORMAL HIGH (ref 70–99)
Glucose-Capillary: 319 mg/dL — ABNORMAL HIGH (ref 70–99)
Glucose-Capillary: 321 mg/dL — ABNORMAL HIGH (ref 70–99)
Glucose-Capillary: 343 mg/dL — ABNORMAL HIGH (ref 70–99)

## 2019-08-05 LAB — BASIC METABOLIC PANEL
Anion gap: 14 (ref 5–15)
Anion gap: 14 (ref 5–15)
Anion gap: 10 (ref 5–15)
BUN: 10 mg/dL (ref 6–20)
BUN: 10 mg/dL (ref 6–20)
BUN: 9 mg/dL (ref 6–20)
CO2: 15 mmol/L — ABNORMAL LOW (ref 22–32)
CO2: 14 mmol/L — ABNORMAL LOW (ref 22–32)
CO2: 17 mmol/L — ABNORMAL LOW (ref 22–32)
Calcium: 8.3 mg/dL — ABNORMAL LOW (ref 8.9–10.3)
Calcium: 8.4 mg/dL — ABNORMAL LOW (ref 8.9–10.3)
Calcium: 8.1 mg/dL — ABNORMAL LOW (ref 8.9–10.3)
Chloride: 107 mmol/L (ref 98–111)
Chloride: 108 mmol/L (ref 98–111)
Chloride: 111 mmol/L (ref 98–111)
Creatinine, Ser: 1.12 mg/dL (ref 0.61–1.24)
Creatinine, Ser: 0.85 mg/dL (ref 0.61–1.24)
Creatinine, Ser: 0.59 mg/dL — ABNORMAL LOW (ref 0.61–1.24)
GFR calc Af Amer: >60 mL/min (ref >60)
GFR calc Af Amer: >60 mL/min (ref >60)
GFR calc Af Amer: >60 mL/min (ref >60)
GFR calc non Af Amer: >60 mL/min (ref >60)
GFR calc non Af Amer: >60 mL/min (ref >60)
GFR calc non Af Amer: >60 mL/min (ref >60)
Glucose, Bld: 347 mg/dL — ABNORMAL HIGH (ref 70–99)
Glucose, Bld: 248 mg/dL — ABNORMAL HIGH (ref 70–99)
Glucose, Bld: 159 mg/dL — ABNORMAL HIGH (ref 70–99)
Potassium: 4.1 mmol/L (ref 3.5–5.1)
Potassium: 3.3 mmol/L — ABNORMAL LOW (ref 3.5–5.1)
Potassium: 3.4 mmol/L — ABNORMAL LOW (ref 3.5–5.1)
Sodium: 136 mmol/L (ref 135–145)
Sodium: 136 mmol/L (ref 135–145)
Sodium: 138 mmol/L (ref 135–145)

## 2019-08-05 LAB — BETA-HYDROXYBUTYRIC ACID
Beta-Hydroxybutyric Acid: 5.13 mmol/L — ABNORMAL HIGH (ref 0.05–0.27)
Beta-Hydroxybutyric Acid: 5.37 mmol/L — ABNORMAL HIGH (ref 0.05–0.27)
Beta-Hydroxybutyric Acid: 3.57 mmol/L — ABNORMAL HIGH (ref 0.05–0.27)
Beta-Hydroxybutyric Acid: 3.54 mmol/L — ABNORMAL HIGH (ref 0.05–0.27)

## 2019-08-05 LAB — HEMOGLOBIN A1C
Hgb A1c MFr Bld: 15.2 % — ABNORMAL HIGH (ref 4.8–5.6)
Mean Plasma Glucose: 389.54 mg/dL

## 2019-08-05 LAB — SARS CORONAVIRUS 2 (TAT 6-24 HRS): SARS Coronavirus 2: NEGATIVE

## 2019-08-05 LAB — HIV ANTIBODY (ROUTINE TESTING W REFLEX): HIV Screen 4th Generation wRfx: NONREACTIVE

## 2019-08-05 MED ORDER — SODIUM CHLORIDE 0.9 % IV SOLN: INTRAVENOUS | Status: DC

## 2019-08-05 MED ORDER — ACETAMINOPHEN 325 MG PO TABS
650.0000 mg | ORAL_TABLET | Freq: Four times a day (QID) | ORAL | Status: DC | PRN
  Administered 2019-08-05 – 2019-08-06 (×2): 650 mg via ORAL
  Filled 2019-08-05 (×2): qty 2

## 2019-08-05 MED ORDER — DEXTROSE-NACL 5-0.45 % IV SOLN: INTRAVENOUS | Status: DC

## 2019-08-05 MED ORDER — POTASSIUM CHLORIDE 10 MEQ/100ML IV SOLN
10.0000 meq | INTRAVENOUS | Status: AC
  Administered 2019-08-05 (×2): 10 meq via INTRAVENOUS
  Filled 2019-08-05 (×2): qty 100

## 2019-08-05 MED ORDER — INSULIN ASPART 100 UNIT/ML ~~LOC~~ SOLN
0.0000 [IU] | Freq: Three times a day (TID) | SUBCUTANEOUS | Status: DC
  Administered 2019-08-05: 3 [IU] via SUBCUTANEOUS
  Administered 2019-08-05 – 2019-08-06 (×3): 5 [IU] via SUBCUTANEOUS
  Filled 2019-08-05 (×4): qty 1

## 2019-08-05 MED ORDER — INSULIN REGULAR(HUMAN) IN NACL 100-0.9 UT/100ML-% IV SOLN
INTRAVENOUS | Status: DC
  Administered 2019-08-05: 13 [IU]/h via INTRAVENOUS
  Filled 2019-08-05: qty 100

## 2019-08-05 MED ORDER — DEXTROSE 50 % IV SOLN: 0.0000 mL | INTRAVENOUS | Status: DC | PRN

## 2019-08-05 MED ORDER — INSULIN ASPART 100 UNIT/ML ~~LOC~~ SOLN
0.0000 [IU] | Freq: Every day | SUBCUTANEOUS | Status: DC
  Administered 2019-08-05: 4 [IU] via SUBCUTANEOUS
  Filled 2019-08-05: qty 1

## 2019-08-05 MED ORDER — ENOXAPARIN SODIUM 40 MG/0.4ML ~~LOC~~ SOLN
40.0000 mg | SUBCUTANEOUS | Status: DC
  Administered 2019-08-06: 40 mg via SUBCUTANEOUS
  Filled 2019-08-05: qty 0.4

## 2019-08-05 MED ORDER — ONDANSETRON HCL 4 MG/2ML IJ SOLN: 4.0000 mg | Freq: Three times a day (TID) | INTRAMUSCULAR | Status: DC | PRN

## 2019-08-05 MED ORDER — INSULIN GLARGINE 100 UNIT/ML ~~LOC~~ SOLN
25.0000 [IU] | Freq: Every day | SUBCUTANEOUS | Status: DC
  Administered 2019-08-05 – 2019-08-06 (×2): 25 [IU] via SUBCUTANEOUS
  Filled 2019-08-05 (×2): qty 0.25

## 2019-08-05 MED ORDER — INSULIN REGULAR(HUMAN) IN NACL 100-0.9 UT/100ML-% IV SOLN: INTRAVENOUS | Status: DC

## 2019-08-05 MED ORDER — POTASSIUM CHLORIDE 10 MEQ/100ML IV SOLN: 10.0000 meq | INTRAVENOUS | Status: DC

## 2019-08-05 MED ORDER — TRAZODONE HCL 50 MG PO TABS: 50.0000 mg | ORAL_TABLET | Freq: Every evening | ORAL | Status: DC | PRN

## 2019-08-05 NOTE — ED Provider Notes (Addendum)
[facility] Emergency Department Provider Note  ____________________________________________   First MD Initiated Contact with Patient 08/04/19 2349     (approximate)  I have reviewed the triage vital signs and the nursing notes.   HISTORY  Chief Complaint Fatigue    HPI Jerome Murphy is a 29 y.o. male with below list of previous medical conditions including type 2 diabetes mellitus presents to the emergency department secondary to weeks of "feeling tired, dehydrated dry despite drinking 24 cases of water in 2 days, recent weight loss without increased physical activity (9 pounds).  Patient states that he was diagnosed with diabetes he believes may have 2019 and prescribed Metformin.  Patient states that he finished the prescription however did not receive a refill.  Patient states he did not seek a refill secondary to the fact that he did not like the "side effects".  Patient states that he had GI upset and palpitations while taking Metformin.  Patient denies any recent illness.       Past Medical History:  Diagnosis Date  . Asthma   . Diabetes mellitus without complication ([place])   . Narcolepsy   . Seizures ([place])     There are no problems to display for this patient.   Past Surgical History:  Procedure Laterality Date  . CIRCUMCISION    . HERNIA REPAIR    . TONSILLECTOMY      Prior to Admission medications   Medication Sig Start Date End Date Taking? Authorizing Provider  metFORMIN (GLUCOPHAGE) 500 MG tablet Take 1 tablet (500 mg total) by mouth 2 (two) times daily with a meal. 11/25/18 11/25/19  Nance Pear, MD  traMADol (ULTRAM) 50 MG tablet Take 1 tablet (50 mg total) by mouth every 6 (six) hours as needed for moderate pain. 05/30/19   Sable Feil, PA-C    Allergies Robitussin [guaifenesin]  No family history on file.  Social History Social History   Tobacco Use  . Smoking status: Current Every Day Smoker    Packs/day:  0.25    Types: Cigarettes  . Smokeless tobacco: Never Used  Substance Use Topics  . Alcohol use: No    Comment: occasionallly  . Drug use: No    Review of Systems Constitutional: No fever/chills.  Positive for generalized fatigue Eyes: No visual changes. ENT: No sore throat. Cardiovascular: Denies chest pain. Respiratory: Denies shortness of breath. Gastrointestinal: No abdominal pain.  No nausea, no vomiting.  No diarrhea.  No constipation. Genitourinary: Negative for dysuria. Musculoskeletal: Negative for neck pain.  Negative for back pain. Integumentary: Negative for rash. Neurological: Negative for headaches, focal weakness or numbness. Endocrine: Positive for hyperglycemia ____________________________________________   PHYSICAL EXAM:  VITAL SIGNS: ED Triage Vitals  Enc Vitals Group     BP 08/04/19 2032 133/76     Pulse Rate 08/04/19 2032 (!) 109     Resp 08/04/19 2032 16     Temp 08/04/19 2032 98.6 F (37 C)     Temp Source 08/04/19 2032 Oral     SpO2 08/04/19 2032 96 %     Weight 08/04/19 2033 90.9 kg (200 lb 6.4 oz)     Height 08/04/19 2033 1.803 m (5\' 11" )     Head Circumference --      Peak Flow --      Pain Score 08/04/19 2033 0     Pain Loc --      Pain Edu? --      Excl. in [place]? --  Constitutional: Alert and oriented.  Eyes: Conjunctivae are normal.  Mouth/Throat: Patient is wearing a mask. Neck: No stridor.  No meningeal signs.   Cardiovascular: Normal rate, regular rhythm. Good peripheral circulation. Grossly normal heart sounds. Respiratory: Normal respiratory effort.  No retractions. Gastrointestinal: Soft and nontender. No distention.  Musculoskeletal: No lower extremity tenderness nor edema. No gross deformities of extremities. Neurologic:  Normal speech and language. No gross focal neurologic deficits are appreciated.  Skin:  Skin is warm, dry and intact. Psychiatric: Mood and affect are normal. Speech and behavior are  normal.  ____________________________________________   LABS (all labs ordered are listed, but only abnormal results are displayed)  Labs Reviewed  GLUCOSE, CAPILLARY - Abnormal; Notable for the following components:      Result Value   Glucose-Capillary 408 (*)    All other components within normal limits  BASIC METABOLIC PANEL - Abnormal; Notable for the following components:   Sodium 132 (*)    CO2 13 (*)    Glucose, Bld 432 (*)    Anion gap 18 (*)    All other components within normal limits  URINALYSIS, COMPLETE (UACMP) WITH MICROSCOPIC - Abnormal; Notable for the following components:   Color, Urine YELLOW (*)    APPearance HAZY (*)    Specific Gravity, Urine 1.035 (*)    Glucose, UA >=500 (*)    Ketones, ur 80 (*)    Protein, ur 30 (*)    All other components within normal limits  BLOOD GAS, VENOUS - Abnormal; Notable for the following components:   pCO2, Ven 26 (*)    pO2, Ven 87.0 (*)    Bicarbonate 13.1 (*)    Acid-base deficit 11.5 (*)    All other components within normal limits  CBC  BASIC METABOLIC PANEL  BETA-HYDROXYBUTYRIC ACID  BETA-HYDROXYBUTYRIC ACID  BETA-HYDROXYBUTYRIC ACID  BASIC METABOLIC PANEL  BASIC METABOLIC PANEL  BASIC METABOLIC PANEL  BASIC METABOLIC PANEL  BASIC METABOLIC PANEL  CBG MONITORING, ED  CBG MONITORING, ED  CBG MONITORING, ED   ___________________________    .Critical Care Performed by: Darci Current, MD Authorized by: Darci Current, MD   Critical care provider statement:    Critical care time (minutes):  30   Critical care time was exclusive of:  Separately billable procedures and treating other patients   Critical care was necessary to treat or prevent imminent or life-threatening deterioration of the following conditions:  Endocrine crisis   Critical care was time spent personally by me on the following activities:  Development of treatment plan with patient or surrogate, discussions with consultants,  evaluation of patient's response to treatment, examination of patient, obtaining history from patient or surrogate, ordering and performing treatments and interventions, ordering and review of laboratory studies, ordering and review of radiographic studies, pulse oximetry, re-evaluation of patient's condition and review of old charts     ____________________________________________   INITIAL IMPRESSION / MDM / ASSESSMENT AND PLAN / ED COURSE  As part of my medical decision making, I reviewed the following data within the electronic MEDICAL RECORD NUMBER  29 year old male presented with above-stated history and physical exam with differential diagnosis including but not limited to diabetic ketoacidosis versus hyperosmolar nonketotic hypoglycemia.  Laboratory data consistent with DKA initial glucose 432 with an anion gap of 18 VBG CO2 of 26 urinalysis with 80+ ketones.  Patient received 2 L IV normal saline with repeat glucose 301.  Insulin infusion initiated.  Patient discussed with hospitalist after hospital admission for  further evaluation and management of diabetic ketoacidosis.  ____________________________________________  FINAL CLINICAL IMPRESSION(S) / ED DIAGNOSES  Final diagnoses:  Diabetic ketoacidosis without coma associated with type 2 diabetes mellitus (HCC)     MEDICATIONS GIVEN DURING THIS VISIT:  Medications  insulin regular, human (MYXREDLIN) 100 units/ 100 mL infusion (has no administration in time range)  0.9 %  sodium chloride infusion (has no administration in time range)  dextrose 5 %-0.45 % sodium chloride infusion (has no administration in time range)  dextrose 50 % solution 0-50 mL (has no administration in time range)  potassium chloride 10 mEq in 100 mL IVPB (has no administration in time range)  sodium chloride 0.9 % bolus 1,000 mL (0 mLs Intravenous Stopped 08/04/19 2215)  sodium chloride 0.9 % bolus 1,000 mL (0 mLs Intravenous Stopped 08/04/19 2337)     ED  Discharge Orders    None      *Please note:  Jerome Murphy was evaluated in Emergency Department on 08/05/2019 for the symptoms described in the history of present illness. He was evaluated in the context of the global COVID-19 pandemic, which necessitated consideration that the patient might be at risk for infection with the SARS-CoV-2 virus that causes COVID-19. Institutional protocols and algorithms that pertain to the evaluation of patients at risk for COVID-19 are in a state of rapid change based on information released by regulatory bodies including the CDC and federal and state organizations. These policies and algorithms were followed during the patient's care in the ED.  Some ED evaluations and interventions may be delayed as a result of limited staffing during the pandemic.*  Note:  This document was prepared using Dragon voice recognition software and may include unintentional dictation errors.   Darci Current, MD 08/05/19 0105    Darci Current, MD 08/18/19 [phone]

## 2019-08-05 NOTE — Plan of Care (Signed)
Continuing with plan of care. 

## 2019-08-05 NOTE — ED Notes (Signed)
Pt is resting in bed. Respirations are regular and unlabored. No signs of acute distress. 

## 2019-08-05 NOTE — Progress Notes (Signed)
Brief hospitalist update note This is a nonbillable note.  Please see same day H&P for full billable details.  This is a 29 year old male with history significant for diabetes mellitus previously on Metformin.  Patient reports nonadherence to his medication regimen for several months due to associated palpitations.  He presented with evidence of mild diabetic ketoacidosis.  He was started on insulin drip with the Endo tool protocol.  Anion gap has closed and patient is endorsing hunger.  Plan: Transition to subcutaneous basal/bolus regimen Start Lantus 25 units, at approximately 0.3units/kg Moderate sliding scale coverage 0 to 5 units nightly coverage Carb controlled diet DC fluids if patient tolerating p.o. Follow-up hemoglobin A1c Anticipate discharge on 08/06/2019  Lolita Patella MD

## 2019-08-05 NOTE — Progress Notes (Addendum)
Inpatient Diabetes Program Recommendations  AACE/ADA: New Consensus Statement on Inpatient Glycemic Control (2015)  Target Ranges:  Prepandial:   less than 140 mg/dL      Peak postprandial:   less than 180 mg/dL (1-2 hours)      Critically ill patients:  140 - 180 mg/dL   Lab Results  Component Value Date   GLUCAP 147 (H) 08/05/2019   HGBA1C 15.2 (H) 08/05/2019    Review of Glycemic Control  Diabetes history: Type 2 Outpatient Diabetes medications: Metformin 500 mg BID (not taking) Current orders for Inpatient glycemic control: IV insulin  Inpatient Diabetes Program Recommendations:   Received diabetes coordinator consult. Agree with current insulin orders for transition off IV insulin drip when ready.  Lantus 25 units to be given 2 hours prior to stopping IV insulin drip and then Novolog MODERATE correction scale given as soon as drip is stopped.   Will continue to monitor blood sugars while in the hospital. Our inpatient diabetes team will talk with patient after admission to hospital.  Smith Mince RN BSN CDE Diabetes Coordinator Pager: [phone]  8am-5pm

## 2019-08-05 NOTE — H&P (Signed)
History and Physical    Jerome Murphy TIR:[id number] DOB: Sep 13, 1990 DOA: 08/04/2019  PCP: Patient, No Pcp Per   Patient coming from: home I have personally briefly reviewed patient's old medical records in [facility]  Chief Complaint: weakness and thirst and weight loss  HPI: Jerome Murphy is a 29 y.o. male with medical history significant for type 2 diabetes, diagnosed in May 2019 started on Metformin but who stopped taking it 1 month later due to side effects who presents to the emergency room for several day history of progressive weakness and increased thirst, drinking up to 24 bottles of water in 2 days.  He also reports unintentional weight loss.  Also has polyuria.  He denies nausea, vomiting or abdominal pain and denies change in bowel habits.  Denies chest pain, cough, shortness of breath, fever or chills  ED Course: Upon arrival in the emergency room he was afebrile with a blood pressure of 133/26 heart rate 109 O2 sat 96% on room air.  On his blood work, blood sugar was 432 with a bicarb of 13 and an anion gap of 18.  There were ketones in his urine.  Potassium was 4.4 and sodium 132.  White cell count was normal.  Patient was given an IV fluid bolus and started on an insulin infusion.  Hospitalist consulted for admission for DKA.  Review of Systems: As per HPI otherwise 10 point review of systems negative.    Past Medical History:  Diagnosis Date  . Asthma   . Diabetes mellitus without complication (HCC)   . Narcolepsy   . Seizures (HCC)     Past Surgical History:  Procedure Laterality Date  . CIRCUMCISION    . HERNIA REPAIR    . TONSILLECTOMY       reports that he has been smoking cigarettes. He has been smoking about 0.25 packs per day. He has never used smokeless tobacco. He reports that he does not drink alcohol or use drugs.  Allergies  Allergen Reactions  . Robitussin [Guaifenesin] Hives and Itching    No family history on file.   Prior to  Admission medications   Medication Sig Start Date End Date Taking? Authorizing Provider  metFORMIN (GLUCOPHAGE) 500 MG tablet Take 1 tablet (500 mg total) by mouth 2 (two) times daily with a meal. 11/25/18 11/25/19  Phineas Semen, MD  traMADol (ULTRAM) 50 MG tablet Take 1 tablet (50 mg total) by mouth every 6 (six) hours as needed for moderate pain. 05/30/19   Joni Reining, PA-C    Physical Exam: Vitals:   08/04/19 2032 08/04/19 2033  BP: 133/76   Pulse: (!) 109   Resp: 16   Temp: 98.6 F (37 C)   TempSrc: Oral   SpO2: 96%   Weight:  90.9 kg  Height:  5\' 11"  (1.803 m)     Vitals:   08/04/19 2032 08/04/19 2033  BP: 133/76   Pulse: (!) 109   Resp: 16   Temp: 98.6 F (37 C)   TempSrc: Oral   SpO2: 96%   Weight:  90.9 kg  Height:  5\' 11"  (1.803 m)    Constitutional: NAD, alert and oriented x 3 Eyes: PERRL, lids and conjunctivae normal ENMT: Mucous membranes are moist.  Neck: normal, supple, no masses, no thyromegaly Respiratory: clear to auscultation bilaterally, no wheezing, no crackles. Normal respiratory effort. No accessory muscle use.  Cardiovascular: Regular rate and rhythm, no murmurs / rubs / gallops. No extremity edema. 2+ pedal  pulses. No carotid bruits.  Abdomen: no tenderness, no masses palpated. No hepatosplenomegaly. Bowel sounds positive.  Musculoskeletal: no clubbing / cyanosis. No joint deformity upper and lower extremities.  Skin: no rashes, lesions, ulcers.  Neurologic: No gross focal neurologic deficit. Psychiatric: Normal mood and affect.   Labs on Admission: I have personally reviewed following labs and imaging studies  CBC: Recent Labs  Lab 08/04/19 2053  WBC 6.7  HGB 15.3  HCT 43.0  MCV 89.2  PLT 024   Basic Metabolic Panel: Recent Labs  Lab 08/04/19 2053  NA 132*  K 4.4  CL 101  CO2 13*  GLUCOSE 432*  BUN 11  CREATININE 1.09  CALCIUM 9.3   GFR: Estimated Creatinine Clearance: 117.3 mL/min (by C-G formula based on SCr of  1.09 mg/dL). Liver Function Tests: No results for input(s): AST, ALT, ALKPHOS, BILITOT, PROT, ALBUMIN in the last 168 hours. No results for input(s): LIPASE, AMYLASE in the last 168 hours. No results for input(s): AMMONIA in the last 168 hours. Coagulation Profile: No results for input(s): INR, PROTIME in the last 168 hours. Cardiac Enzymes: No results for input(s): CKTOTAL, CKMB, CKMBINDEX, TROPONINI in the last 168 hours. BNP (last 3 results) No results for input(s): PROBNP in the last 8760 hours. HbA1C: No results for input(s): HGBA1C in the last 72 hours. CBG: Recent Labs  Lab 08/04/19 2039 08/04/19 2352 08/05/19 0036  GLUCAP 408* 301* 319*   Lipid Profile: No results for input(s): CHOL, HDL, LDLCALC, TRIG, CHOLHDL, LDLDIRECT in the last 72 hours. Thyroid Function Tests: No results for input(s): TSH, T4TOTAL, FREET4, T3FREE, THYROIDAB in the last 72 hours. Anemia Panel: No results for input(s): VITAMINB12, FOLATE, FERRITIN, TIBC, IRON, RETICCTPCT in the last 72 hours. Urine analysis:    Component Value Date/Time   COLORURINE YELLOW (A) 08/04/2019 2053   APPEARANCEUR HAZY (A) 08/04/2019 2053   LABSPEC 1.035 (H) 08/04/2019 2053   PHURINE 5.0 08/04/2019 2053   GLUCOSEU >=500 (A) 08/04/2019 2053   HGBUR NEGATIVE 08/04/2019 2053   BILIRUBINUR NEGATIVE 08/04/2019 2053   KETONESUR 80 (A) 08/04/2019 2053   PROTEINUR 30 (A) 08/04/2019 2053   UROBILINOGEN 1.0 03/30/2015 1850   NITRITE NEGATIVE 08/04/2019 2053   LEUKOCYTESUR NEGATIVE 08/04/2019 2053    Radiological Exams on Admission: No results found.  EKG: Independently reviewed.   Assessment/Plan Active Problems:   DKA (diabetic ketoacidoses) (HCC) Continue IV insulin per protocol.  Transition to subcu once anion gap closes Serial BMPs, every 4 hours Follow-up A1c test as baseline control    Non compliance w medication regimen Diabetic coordinator consult, nutritionist consult and case management to assess  barriers to getting medication    DVT prophylaxis: lovenox  Code Status: full code  Family Communication: none  Disposition Plan: Back to previous home environment Consults called: none     Athena Masse MD [facility]     08/05/2019, 12:44 AM

## 2019-08-06 DIAGNOSIS — E1110 Type 2 diabetes mellitus with ketoacidosis without coma: Principal | ICD-10-CM

## 2019-08-06 DIAGNOSIS — Z9114 Patient's other noncompliance with medication regimen: Secondary | ICD-10-CM

## 2019-08-06 LAB — BASIC METABOLIC PANEL
Anion gap: 10 (ref 5–15)
BUN: 6 mg/dL (ref 6–20)
CO2: 23 mmol/L (ref 22–32)
Calcium: 8.3 mg/dL — ABNORMAL LOW (ref 8.9–10.3)
Chloride: 106 mmol/L (ref 98–111)
Creatinine, Ser: 0.56 mg/dL — ABNORMAL LOW (ref 0.61–1.24)
GFR calc Af Amer: >60 mL/min (ref >60)
GFR calc non Af Amer: >60 mL/min (ref >60)
Glucose, Bld: 237 mg/dL — ABNORMAL HIGH (ref 70–99)
Potassium: 3.2 mmol/L — ABNORMAL LOW (ref 3.5–5.1)
Sodium: 139 mmol/L (ref 135–145)

## 2019-08-06 LAB — GLUCOSE, CAPILLARY
Glucose-Capillary: 215 mg/dL — ABNORMAL HIGH (ref 70–99)
Glucose-Capillary: 236 mg/dL — ABNORMAL HIGH (ref 70–99)

## 2019-08-06 MED ORDER — LIVING WELL WITH DIABETES BOOK
Freq: Once | Status: DC
  Filled 2019-08-06: qty 1

## 2019-08-06 MED ORDER — LIVING WELL WITH DIABETES BOOK: 1.0000 | Freq: Once | 0 refills | Status: AC

## 2019-08-06 MED ORDER — POTASSIUM CHLORIDE CRYS ER 20 MEQ PO TBCR
40.0000 meq | EXTENDED_RELEASE_TABLET | Freq: Once | ORAL | Status: AC
  Administered 2019-08-06: 40 meq via ORAL
  Filled 2019-08-06: qty 2

## 2019-08-06 MED ORDER — BLOOD GLUCOSE METER KIT: PACK | 0 refills | Status: DC

## 2019-08-06 MED ORDER — INSULIN STARTER KIT- PEN NEEDLES (ENGLISH)
1.0000 | Freq: Once | Status: DC
  Filled 2019-08-06: qty 1

## 2019-08-06 MED ORDER — INSULIN NPH (HUMAN) (ISOPHANE) 100 UNIT/ML ~~LOC~~ SUSP
5.0000 [IU] | Freq: Two times a day (BID) | SUBCUTANEOUS | Status: DC
  Filled 2019-08-06: qty 10

## 2019-08-06 MED ORDER — NOVOLIN N FLEXPEN RELION 100 UNIT/ML ~~LOC~~ SUPN: 5.0000 [IU] | PEN_INJECTOR | Freq: Two times a day (BID) | SUBCUTANEOUS | 0 refills | Status: DC

## 2019-08-06 MED ORDER — INSULIN PEN NEEDLE 32G X 4 MM MISC: 1.0000 "application " | Freq: Two times a day (BID) | 0 refills | Status: DC

## 2019-08-06 MED ORDER — INSULIN STARTER KIT- PEN NEEDLES (ENGLISH): 1.0000 | Freq: Once | 0 refills | Status: AC

## 2019-08-06 NOTE — Plan of Care (Signed)
Discharge teaching completed with patient who verbalized understanding of teaching and return demonstration of insulin administration, verbalized understanding of medications and follow up.  Discharged in stable condition with all belongings.

## 2019-08-06 NOTE — Progress Notes (Signed)
Results for Jerome Murphy, Jerome Murphy (MRN [id number]) as of 08/06/2019 09:56  Ref. Range 08/05/2019 11:31 08/05/2019 16:56 08/05/2019 21:12 08/05/2019 23:23 08/06/2019 07:34  Glucose-Capillary Latest Ref Range: 70 - 99 mg/dL 701 (H) 100 (H) 349 (H) 321 (H) 215 (H)  Noted that blood sugars have been greater than 180 mg/dl.  Recommend increasing Lantus to 28 units daily, continue Novolog MODERATE correction scale TID & HS scale, and add Novolog 5 units TID with meals if eating at least 50% of meal.   HgbA1C is 15.5%. Will try to talk with patient on the phone later today.   Smith Mince RN BSN CDE Diabetes Coordinator Pager: [phone]  8am-5pm

## 2019-08-06 NOTE — Progress Notes (Signed)
Spoke to patient on the phone about his diabetes. Was diagnosed last May, 2020 and was started on Metformin. States that he ran out of the medicine in August and never refilled it.   Talked with him about taking insulin with insulin pen. Patient states that his mother takes insulin with an insulin pen, so she can help him with it. Reviewed the order for Novolin Relion NPH 5 units BID to be given at breakfast and bedtime. Will be getting his insulin pens at [facility]. Told him that the cost is $42.88 for a box of 5 pens. Ordered insulin pen starter kit from pharmacy for him to have written instructions and a few insulin pen needles to take home.  Reviewed his diet and to stop drinking sweet drinks. Recommended using sugar substitute, not drink fruit juice or sweet tea. Ordered Living Well with Diabetes booklet for him to take home. Referenced the American Diabetes Association website and Youtube for instruction on insulin pen.   Patient has a home blood glucose meter. Told him that he would be getting a prescription for a meter, strips, and lancets that he could use anywhere or he could get a Relion meter and strips at [employer].   Spoke to staff RN who will have patient give a saline injection before discharge. She will review insulin administration and hypoglycemia treatment. Will attach diabetes exit notes for reference. Recommended to patient to get a follow up visit with PCP and perhaps try to see an endocrinologist. States will need to check on insurance tomorrow.   Harvel Ricks RN BSN CDE Diabetes Coordinator Pager: [phone]  8am-5pm

## 2019-08-06 NOTE — Progress Notes (Signed)
Received verbal consult from RN Claiborne Billings regarding Medicaid application assistance and diabetic starter kit. Provided RN Claiborne Billings with diabetic starter kit, good Rx card, and resources for [employer], [facility] financial/medicaid, and DSS contact for Medicaid application, as per requested by mom. LCSW attempted to reach patient via room phone and cell phone, but no answer and no voicemail option. Consult closed.  Oretha Ellis, LCSW Transitions of Care Dept [phone]

## 2019-08-06 NOTE — Plan of Care (Signed)
Continuing with plan of care. 

## 2019-08-06 NOTE — Discharge Summary (Signed)
Jerome Murphy HYI:[id number] DOB: [birth year] DOA: 08/04/2019  PCP: Patient, No Pcp Per  Admit date: 08/04/2019 Discharge date: 08/06/2019  Admitted From: Home Disposition: Home  Recommendations for Outpatient Follow-up:  1. Follow up with PCP in next week as previously scheduled 2. Please obtain BMP/CBC in one week  Home Health: None   Discharge Condition:Stable CODE STATUS: Full Diet recommendation: Heart Healthy / Carb Modified / Regular / Dysphagia  Brief/Interim Summary: Jerome Murphy is a 29 y.o. male with medical history significant for type 2 diabetes, diagnosed in May 2019 started on Metformin but who stopped taking it 1 month later due to side effects who presents to the emergency room for several day history of progressive weakness and increased thirst, drinking up to 24 bottles of water in 2 days.  He also reports unintentional weight loss.  Also has polyuria.   Upon arrival in the emergency room he was afebrile with a blood pressure of 133/26 heart rate 109 O2 sat 96% on room air.  On his blood work, blood sugar was 432 with a bicarb of 13 and an anion gap of 18.  There were ketones in his urine.  Potassium was 4.4 and sodium 132.  White cell count was normal.  Patient was given an IV fluid bolus and started on an insulin infusion.  Hospitalist consulted for admission for DKA.  Once his anion gap closed and patient was taking p.o. intake he was transition to subcu insulin.  Diabetic educator was consulted.  He was placed on a carb controlled diet.  His hemoglobin A1c is 15.2.  His beta hydroxybutyrate was improved.  His potassium was mildly low and that was replaced.  Today he is stable to be discharged home has no complaints.  I spent extensive time educating patient was on carb controlled diet, medical compliance, importance of getting his HA1C under control.  Also discussed risk and complications of uncontrolled diabetes including amputation, ended up on dialysis, stroke,  MI, and more.  He verbalizes understanding.  Patient also told me he smokes and counseled patient extensively on smoking cessation and risk and complications of ongoing smoking.  He has promised to make changes with his health and  lifestyle.  Discharge Diagnoses:  Active Problems:   DKA (diabetic ketoacidoses) (HCC)   Non compliance w medication regimen    Discharge Instructions  Discharge Instructions    Diet - low sodium heart healthy   Complete by: As directed    Discharge instructions   Complete by: As directed    Stop smoking Check blood glucose 3x daily Follow up with primary care next week   Increase activity slowly   Complete by: As directed      Allergies as of 08/06/2019      Reactions   Robitussin [guaifenesin] Hives, Itching      Medication List    STOP taking these medications   acetaminophen 500 MG tablet Commonly known as: TYLENOL   ibuprofen 200 MG tablet Commonly known as: ADVIL   traMADol 50 MG tablet Commonly known as: Ultram     TAKE these medications   blood glucose meter kit and supplies Dispense based on patient and insurance preference. Use up to four times daily as directed. (FOR ICD-10 E10.9, E11.9).   Insulin Pen Needle 32G X 4 MM Misc 1 application by Does not apply route 2 (two) times daily.   insulin starter kit- pen needles Misc 1 kit by Other route once for 1 dose.   living well  with diabetes book Misc 1 each by Does not apply route once for 1 dose.   metFORMIN 500 MG tablet Commonly known as: Glucophage Take 1 tablet (500 mg total) by mouth 2 (two) times daily with a meal.   NovoLIN N FlexPen ReliOn 100 UNIT/ML Kiwkpen Generic drug: Insulin NPH (Human) (Isophane) Inject 5 Units into the skin 2 (two) times daily.       Allergies  Allergen Reactions  . Robitussin [Guaifenesin] Hives and Itching    Consultations:  Diabetic educator   Procedures/Studies: No results found.    Subjective: Patient seen and  examined.  Feeling better.  Denies any dizziness, shortness of breath, chest pain or any other symptoms.  His blood glucose was mildly elevated last night and today because he has been drinking sweetened tea from [place] that was brought in by girlfriend.  Discharge Exam: Vitals:   08/05/19 1951 08/06/19 0421  BP: 134/79 110/81  Pulse: 77 74  Resp: 18 16  Temp: 98 F (36.7 C) 97.7 F (36.5 C)  SpO2: 97% 99%   Vitals:   08/05/19 1200 08/05/19 1423 08/05/19 1951 08/06/19 0421  BP:  131/81 134/79 110/81  Pulse: 84 72 77 74  Resp: _0 Temp:  98 F (36.7 C) 98 F (36.7 C) 97.7 F (36.5 C)  TempSrc:  Oral Oral Oral  SpO2: 98% 98% 97% 99%  Weight:      Height:        General: Pt is alert, awake, not in acute distress Cardiovascular: RRR, S1/S2 +, no rubs, no gallops Respiratory: CTA bilaterally, no wheezing, no rhonchi Abdominal: Soft, NT, ND, bowel sounds + Extremities: no edema, no cyanosis    The results of significant diagnostics from this hospitalization (including imaging, microbiology, ancillary and laboratory) are listed below for reference.     Microbiology: Recent Results (from the past 240 hour(s))  SARS CORONAVIRUS 2 (TAT 6-24 HRS) Nasopharyngeal Nasopharyngeal Swab     Status: None   Collection Time: 08/05/19 12:27 AM   Specimen: Nasopharyngeal Swab  Result Value Ref Range Status   SARS Coronavirus 2 NEGATIVE NEGATIVE Final    Comment: (NOTE) SARS-CoV-2 target nucleic acids are NOT DETECTED. The SARS-CoV-2 RNA is generally detectable in upper and lower respiratory specimens during the acute phase of infection. Negative results do not preclude SARS-CoV-2 infection, do not rule out co-infections with other pathogens, and should not be used as the sole basis for treatment or other patient management decisions. Negative results must be combined with clinical observations, patient history, and epidemiological information. The expected result is  Negative. Fact Sheet for Patients: SugarRoll.be Fact Sheet for Healthcare Providers: https://www.woods-mathews.com/ This test is not yet approved or cleared by the [place] FDA and  has been authorized for detection and/or diagnosis of SARS-CoV-2 by FDA under an Emergency Use Authorization (EUA). This EUA will remain  in effect (meaning this test can be used) for the duration of the COVID-19 declaration under Section 56 4(b)(1) of the Act, 21 U.S.C. section 360bbb-3(b)(1), unless the authorization is terminated or revoked sooner. Performed at [facility], [place] [address] [place], [place] [address]      Labs: BNP (last 3 results) No results for input(s): BNP in the last 8760 hours. Basic Metabolic Panel: Recent Labs  Lab 08/04/19 2053 08/04/19 2353 08/05/19 0123 08/05/19 0458 08/06/19 0515  NA 132* 136 136 138 139  K 4.4 4.1 3.3* 3.4* 3.2*  CL 101 107 108 111 106  CO2 13* 15* 14* 17* 23  GLUCOSE 432* 347* 248* 159* 237*  BUN _0 CREATININE 1.09 1.12 0.85 0.59* 0.56*  CALCIUM 9.3 8.3* 8.4* 8.1* 8.3*   Liver Function Tests: No results for input(s): AST, ALT, ALKPHOS, BILITOT, PROT, ALBUMIN in the last 168 hours. No results for input(s): LIPASE, AMYLASE in the last 168 hours. No results for input(s): AMMONIA in the last 168 hours. CBC: Recent Labs  Lab 08/04/19 2053  WBC 6.7  HGB 15.3  HCT 43.0  MCV 89.2  PLT 179   Cardiac Enzymes: No results for input(s): CKTOTAL, CKMB, CKMBINDEX, TROPONINI in the last 168 hours. BNP: Invalid input(s): POCBNP CBG: Recent Labs  Lab 08/05/19 1656 08/05/19 2112 08/05/19 2323 08/06/19 0734 08/06/19 1223  GLUCAP 250* 343* 321* 215* 236*   D-Dimer No results for input(s): DDIMER in the last 72 hours. Hgb A1c Recent Labs    08/05/19 0123  HGBA1C 15.2*   Lipid Profile No results for input(s): CHOL, HDL, LDLCALC, TRIG, CHOLHDL, LDLDIRECT in the last 72  hours. Thyroid function studies No results for input(s): TSH, T4TOTAL, T3FREE, THYROIDAB in the last 72 hours.  Invalid input(s): FREET3 Anemia work up No results for input(s): VITAMINB12, FOLATE, FERRITIN, TIBC, IRON, RETICCTPCT in the last 72 hours. Urinalysis    Component Value Date/Time   COLORURINE YELLOW (A) 08/04/2019 2053   APPEARANCEUR HAZY (A) 08/04/2019 2053   LABSPEC 1.035 (H) 08/04/2019 2053   PHURINE 5.0 08/04/2019 2053   GLUCOSEU >=500 (A) 08/04/2019 2053   HGBUR NEGATIVE 08/04/2019 2053   BILIRUBINUR NEGATIVE 08/04/2019 2053   KETONESUR 80 (A) 08/04/2019 2053   PROTEINUR 30 (A) 08/04/2019 2053   UROBILINOGEN 1.0 03/30/2015 1850   NITRITE NEGATIVE 08/04/2019 2053   LEUKOCYTESUR NEGATIVE 08/04/2019 2053   Sepsis Labs Invalid input(s): PROCALCITONIN,  WBC,  LACTICIDVEN Microbiology Recent Results (from the past 240 hour(s))  SARS CORONAVIRUS 2 (TAT 6-24 HRS) Nasopharyngeal Nasopharyngeal Swab     Status: None   Collection Time: 08/05/19 12:27 AM   Specimen: Nasopharyngeal Swab  Result Value Ref Range Status   SARS Coronavirus 2 NEGATIVE NEGATIVE Final    Comment: (NOTE) SARS-CoV-2 target nucleic acids are NOT DETECTED. The SARS-CoV-2 RNA is generally detectable in upper and lower respiratory specimens during the acute phase of infection. Negative results do not preclude SARS-CoV-2 infection, do not rule out co-infections with other pathogens, and should not be used as the sole basis for treatment or other patient management decisions. Negative results must be combined with clinical observations, patient history, and epidemiological information. The expected result is Negative. Fact Sheet for Patients: SugarRoll.be Fact Sheet for Healthcare Providers: https://www.woods-mathews.com/ This test is not yet approved or cleared by the [place] FDA and  has been authorized for detection and/or diagnosis of SARS-CoV-2  by FDA under an Emergency Use Authorization (EUA). This EUA will remain  in effect (meaning this test can be used) for the duration of the COVID-19 declaration under Section 56 4(b)(1) of the Act, 21 U.S.C. section 360bbb-3(b)(1), unless the authorization is terminated or revoked sooner. Performed at [facility], [place] [address], [place],  [address]      Time coordinating discharge: Over 30 minutes  SIGNED:   Nolberto Hanlon, MD  [facility] 08/06/2019, 12:59 PM Pager   If 7PM-7AM, please contact night-coverage [email or web address] Password [email or web address]

## 2019-08-06 NOTE — Discharge Instructions (Signed)
Glucose Products:  ReliOnT glucose products raise low blood sugar fast. Tablets are free of fat, caffeine, sodium and gluten. They are portable and easy to carry, making it easier for people with diabetes to BE PREPARED for lows.  Glucose Tablets Available in 6 flavors . 10 ct...................................... $1.00 . 50 ct...................................... $3.98 Glucose Shot..................................$1.48 Glucose Gel....................................$3.44  Alcohol Swabs Alcohol swabs are used to sterilize your injection site. All of our swabs are individually wrapped for maximum safety, convenience and moisture retention. ReliOnT Alcohol Swabs . 100 ct Swabs..............................$1.00 . 400 ct Swabs..............................$3.74  Lancets ReliOnT offers three lancet options conveniently designed to work with almost every lancing device. Each features a protective disk, which guarantees sterility before testing. ReliOnT Lancets . 100 ct Lancets $1.56 . 200 ct Lancets $2.64 Available in Ultra-Thin, Thin & Micro-Thin ReliOnT 2-IN-1 Lancing Device . 50 ct Lancets..................................... $3.44 Available in 30 gauge and 25 gauge ReliOnT Lancing Device....................$5.84  Blood Glucose Monitors ReliOnT offers a full range of blood glucose testing options to provide an accurate, affordable system that meets each person's unique needs and preferences. Prime Meter....................................... $9.00 Prime Test Strips . 25 test strips.................................... $5.00 . 50 test strips.................................... $9.00 . 100 test strips.................................$17.88 Premier BLU Meter  ............  $18.98 Premier Voice Meter  .............  $14.98 Premier Test Strips . 50 test strips.................................... $9.00 . 100 test strips.................................$17.88 Premier [employer]   ............  $19.44 Kit includes: . 50 test strips . 10 lancets . Lancing device . Carry case  Ketone Test Strips . 50 test strips  ................  $6.64  Human Insulin  Novolin/ReliOnT (recombinant DNA origin) is manufactured for [employer] by [employer] Insulin* with Vial..........$24.88 Available in N, R, 70/30 Novolin/ReliOnT Insulin Pens*  .....  $42.88 Available in N, R, 70/30  Insulin Delivery ReliOnT syringes and pen needles provide precision technology, comfort and accuracy in insulin delivery at affordable prices. ReliOnT Pen Needles* . 50 ct....................................................$9.00 Available in 25m, 684m 33m3m 57m4mliOnT Insulin Syringes* . 100 ct ............ $12.58 Available in 29G, 30G & 31G (3/10cc, 1/2cc & 1cc units)

## 2019-08-07 ENCOUNTER — Other Ambulatory Visit: Payer: Self-pay | Admitting: Internal Medicine

## 2019-08-28 NOTE — Progress Notes (Signed)
Patient ID: Jerome Murphy, male    DOB: Jul 12, 1991, 29 y.o.   MRN: [id number]  PCP: Towanda Malkin, MD  Chief Complaint  Patient presents with  . New Patient (Initial Visit)  . Diabetes    diagnosed around a year ago    Subjective:   Jerome Murphy is a 29 y.o. male, presents to clinic with CC of the following:  Chief Complaint  Patient presents with  . New Patient (Initial Visit)  . Diabetes    diagnosed around a year ago    HPI:  Jerome Murphy a 28 y.o.male who follows up after being discharged from the hospital on 08/06/19. His past medical history is significant fortype 2 diabetes, diagnosed in May 2019 started on Metformin and stopped taking it 1 month later due to side effects (heart flutter noted) who presented to the emergency room for several day history of progressive weakness and increased thirst, drinking up to 24 bottles of water in 2 days.He also reports unintentional weight loss. Also has polyuria.  Upon arrival in the emergency room, his blood work had a  blood sugar of 432 with a bicarb of 13 and an anion gap of 18. There were ketones in his urine. Potassium was 4.4 and sodium 132. White cell count was normal. Patient was given an IV fluid bolus and started on an insulin infusion. Hospitalist consulted for admission for DKA.  Once his anion gap closed and patient was taking p.o. intake he was transitioned to subcu insulin.  Diabetic educator was consulted.  He was placed on a carb controlled diet.  His hemoglobin A1c is 15.2.  His beta hydroxybutyrate was improved.  His potassium was mildly low and that was replaced. On discharge,  time was spent educating patient on carb controlled diet, medical compliance, importance of getting his HA1C under control.  Also the risk and complications of uncontrolled diabetes including amputation, ending up on dialysis, stroke, MI, and more were discussed.  He verbalized  understanding. Also was counseled extensively on smoking cessation and risk and complications of ongoing smoking.  He promised to make changes with his health and  lifestyle.  Since discharge, notes doing okay. Reviewed medication regimen - Insulin NPH 5U at 9 am and 5 U at 9 pm, Metformin 578m bid  Taking the medicines for DM regularly, not missing doses  Not check blood sugars regularly presently, twice a day if can, might miss a day or two.  running between high 100's - 240+ ( on a bad day). No very low blood sugars on home checks Not drinking pops, sugary drinks, doing ok with diet, but admits changes tried lasted about a week, and then more lax, only eats one or two meals day.  No CP, SOB, marked fatigue, urinary frequency, increased thirst, numbness or tingling in the extremities with him noting occasional tingling feeling in the very distal fingertips, mild loose BM's at times, no frank diarrhea,sometimes upset stomach after takes metformin and sometimes at night after he takes the insulin.  Last eye exam - years ago, noted do like to get regular eye care checks when is diabetic.  Exercise - no regular exercise regimen,  trying to be more active, less sedentary with daily activities.  Most recent A1C:  Lab Results  Component Value Date   HGBA1C 15.2 (H) 08/05/2019     Last CMP Results :     Component Value Date/Time   NA 139 08/06/2019 0515   K  3.2 (L) 08/06/2019 0515   CL 106 08/06/2019 0515   CO2 23 08/06/2019 0515   GLUCOSE 237 (H) 08/06/2019 0515   BUN 6 08/06/2019 0515   CREATININE 0.56 (L) 08/06/2019 0515   CALCIUM 8.3 (L) 08/06/2019 0515   PROT 7.9 05/30/2019 1121   ALBUMIN 4.1 05/30/2019 1121   AST 10 (L) 05/30/2019 1121   ALT 11 05/30/2019 1121   ALKPHOS 93 05/30/2019 1121   BILITOT 1.6 (H) 05/30/2019 1121   GFRNONAA >60 08/06/2019 0515   GFRAA >60 08/06/2019 0515   No recent lipid panel   Discharge Instructions      Discharge Instructions    Diet  - low sodium heart healthy   Complete by: As directed    Discharge instructions   Complete by: As directed    Stop smoking Check blood glucose 3x daily Follow up with primary care next week   Increase activity slowly   Complete by: As directed    TAKE these medications   blood glucose meter kit and supplies Dispense based on patient and insurance preference. Use up to four times daily as directed. (FOR ICD-10 E10.9, E11.9).   Insulin Pen Needle 32G X 4 MM Misc 1 application by Does not apply route 2 (two) times daily.   insulin starter kit- pen needles Misc 1 kit by Other route once for 1 dose.   living well with diabetes book Misc 1 each by Does not apply route once for 1 dose.   metFORMIN 500 MG tablet Commonly known as: Glucophage Take 1 tablet (500 mg total) by mouth 2 (two) times daily with a meal.   NovoLIN N FlexPen ReliOn 100 UNIT/ML Kiwkpen Generic drug: Insulin NPH (Human) (Isophane) Inject 5 Units into the skin 2 (two) times daily.      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 Wants to try the patch, and is OTC, also can try gums to help. Alcohol - none at all  Patient Active Problem List   Diagnosis Date Noted  . DKA (diabetic ketoacidoses) ([place]) 08/05/2019  . Non compliance w medication regimen 08/05/2019      Current Outpatient Medications:  .  Insulin NPH, Human,, Isophane, (NOVOLIN N FLEXPEN RELION) 100 UNIT/ML Kiwkpen, Inject 5 Units into the skin 2 (two) times daily., Disp: 15 mL, Rfl: 0 .  Insulin Pen Needle 32G X 4 MM MISC, 1 application by Does not apply route 2 (two) times daily., Disp: 60 each, Rfl: 0 .  metFORMIN (GLUCOPHAGE) 500 MG tablet, Take 1 tablet (500 mg total) by mouth 2 (two) times daily with a meal., Disp: 60 tablet, Rfl: 2   Allergies  Allergen Reactions  . Robitussin [Guaifenesin] Hives and Itching     Past Surgical History:  Procedure Laterality Date  .  CIRCUMCISION    . HERNIA REPAIR    . TONSILLECTOMY       FH - M with DM, her brother X61, [place] with DM  Social History   Socioeconomic History  . Marital status: Single    Spouse name: Not on file  . Number of children: 2  . Years of education: Not on file  . Highest education level: Associate degree: occupational, Hotel manager, or vocational program  Occupational History  . Occupation: Barbershop  Tobacco Use  . Smoking status: Current Every Day Smoker    Packs/day: 0.25    Types: Cigarettes  . Smokeless tobacco: Never Used  Substance and  Sexual Activity  . Alcohol use: No    Comment: occasionallly  . Drug use: No  . Sexual activity: Yes    Birth control/protection: Condom    Comment: Partner uses Depo  Other Topics Concern  . Not on file  Social History Narrative  . Not on file   Social Determinants of Health   Financial Resource Strain:   . Difficulty of Paying Living Expenses: Not on file  Food Insecurity:   . Worried About Charity fundraiser in the Last Year: Not on file  . Ran Out of Food in the Last Year: Not on file  Transportation Needs:   . Lack of Transportation (Medical): Not on file  . Lack of Transportation (Non-Medical): Not on file  Physical Activity:   . Days of Exercise per Week: Not on file  . Minutes of Exercise per Session: Not on file  Stress:   . Feeling of Stress : Not on file  Social Connections:   . Frequency of Communication with Friends and Family: Not on file  . Frequency of Social Gatherings with Friends and Family: Not on file  . Attends Religious Services: Not on file  . Active Member of Clubs or Organizations: Not on file  . Attends Archivist Meetings: Not on file  . Marital Status: Not on file  Intimate Partner Violence:   . Fear of Current or Ex-Partner: Not on file  . Emotionally Abused: Not on file  . Physically Abused: Not on file  . Sexually Abused: Not on file    With staff assistance, above reviewed with  the patient today.  ROS:  As per HPI Constitutional: Negative for fever or other Covid concerning symptoms, no history of thyroid disease Respiratory: Negative for cough. shortness of breath.   Cardiovascular: Negative for chest pain or palpitations. No increased LE swelling Gastrointestinal: Negative for persistent abdominal pain. Musculoskeletal: Negative for joint swelling/pains, gait problems Neurological: Negative for increased headache, no balance difficulties No other specific complaints on sytems review (except as listed in HPI above).    PHQ2/9: Depression screen PHQ 2/9 08/29/2019  Decreased Interest 0  Down, Depressed, Hopeless 0  PHQ - 2 Score 0  Altered sleeping 0  Tired, decreased energy 0  Change in appetite 0  Feeling bad or failure about yourself  0  Trouble concentrating 0  Moving slowly or fidgety/restless 0  Suicidal thoughts 0  PHQ-9 Score 0  Difficult doing work/chores Not difficult at all   PHQ-2/9 Result is neg  Fall Risk: Fall Risk  08/29/2019  Falls in the past year? 0  Number falls in past yr: 0  Injury with Fall? 0      Objective:   Vitals:   08/29/19 0912  BP: 102/62  Pulse: 92  Resp: 16  Temp: 97.9 F (36.6 C)  TempSrc: Temporal  SpO2: 96%  Weight: 209 lb 8 oz (95 kg)  Height: 6' (1.829 m)    Body mass index is 28.41 kg/m.  Physical Exam   NAD, masked HEENT - [place]/AT, sclera anicteric, PERRL, EOMI, conj - non-inj'ed, TM's and canals clear, pharynx clear Neck - supple, no adenopathy, no TM, carotids 2+ and = without bruits bilat Car - RRR without m/g/r Pulm- RR and effort normal at rest, CTA without wheeze or rales Abd - soft, NT, ND, BS+,  no masses, positive healed scar in right lower quadrant with a large area of increased pigmentation surrounding the scar, which was present before he was  hospitalized for DKA.  No erythema, no obvious hepatosplenomegaly. Back - no CVA tenderness Skin- Increased pigmentation in area of right  abdomen, no other rash noted on exposed areas Ext - no LE edema, no active joints Neuro/psychiatric - affect was not flat, appropriate with conversation  Alert and oriented  Grossly non-focal - good strength on testing extremities, sensation intact to LT in distal extremities, DTR's 2+ and = in patella  Speech and gait are normal, Romberg neg, good balance on one foot  Results for orders placed or performed during the hospital encounter of 08/04/19  SARS CORONAVIRUS 2 (TAT 6-24 HRS) Nasopharyngeal Nasopharyngeal Swab   Specimen: Nasopharyngeal Swab  Result Value Ref Range   SARS Coronavirus 2 NEGATIVE NEGATIVE  Glucose, capillary  Result Value Ref Range   Glucose-Capillary 408 (H) 70 - 99 mg/dL   Comment 1 Notify RN   Basic metabolic panel  Result Value Ref Range   Sodium 132 (L) 135 - 145 mmol/L   Potassium 4.4 3.5 - 5.1 mmol/L   Chloride 101 98 - 111 mmol/L   CO2 13 (L) 22 - 32 mmol/L   Glucose, Bld 432 (H) 70 - 99 mg/dL   BUN 11 6 - 20 mg/dL   Creatinine, Ser 1.09 0.61 - 1.24 mg/dL   Calcium 9.3 8.9 - 10.3 mg/dL   GFR calc non Af Amer >60 >60 mL/min   GFR calc Af Amer >60 >60 mL/min   Anion gap 18 (H) 5 - 15  CBC  Result Value Ref Range   WBC 6.7 4.0 - 10.5 K/uL   RBC 4.82 4.22 - 5.81 MIL/uL   Hemoglobin 15.3 13.0 - 17.0 g/dL   HCT 43.0 39.0 - 52.0 %   MCV 89.2 80.0 - 100.0 fL   MCH 31.7 26.0 - 34.0 pg   MCHC 35.6 30.0 - 36.0 g/dL   RDW 11.8 11.5 - 15.5 %   Platelets 179 150 - 400 K/uL   nRBC 0.0 0.0 - 0.2 %  Urinalysis, Complete w Microscopic  Result Value Ref Range   Color, Urine YELLOW (A) YELLOW   APPearance HAZY (A) CLEAR   Specific Gravity, Urine 1.035 (H) 1.005 - 1.030   pH 5.0 5.0 - 8.0   Glucose, UA >=500 (A) NEGATIVE mg/dL   Hgb urine dipstick NEGATIVE NEGATIVE   Bilirubin Urine NEGATIVE NEGATIVE   Ketones, ur 80 (A) NEGATIVE mg/dL   Protein, ur 30 (A) NEGATIVE mg/dL   Nitrite NEGATIVE NEGATIVE   Leukocytes,Ua NEGATIVE NEGATIVE   RBC / HPF 0-5 0 - 5  RBC/hpf   WBC, UA 0-5 0 - 5 WBC/hpf   Bacteria, UA NONE SEEN NONE SEEN   Squamous Epithelial / LPF 0-5 0 - 5   Mucus PRESENT   Blood gas, venous  Result Value Ref Range   pH, Ven 7.31 7.250 - 7.430   pCO2, Ven 26 (L) 44.0 - 60.0 mmHg   pO2, Ven 87.0 (H) 32.0 - 45.0 mmHg   Bicarbonate 13.1 (L) 20.0 - 28.0 mmol/L   Acid-base deficit 11.5 (H) 0.0 - 2.0 mmol/L   O2 Saturation 95.7 %   Patient temperature 37.0    Collection site VENOUS    Sample type VENOUS   Basic metabolic panel  Result Value Ref Range   Sodium 136 135 - 145 mmol/L   Potassium 4.1 3.5 - 5.1 mmol/L   Chloride 107 98 - 111 mmol/L   CO2 15 (L) 22 - 32 mmol/L   Glucose, Bld 347 (  H) 70 - 99 mg/dL   BUN 10 6 - 20 mg/dL   Creatinine, Ser 1.12 0.61 - 1.24 mg/dL   Calcium 8.3 (L) 8.9 - 10.3 mg/dL   GFR calc non Af Amer >60 >60 mL/min   GFR calc Af Amer >60 >60 mL/min   Anion gap 14 5 - 15  Beta-hydroxybutyric acid  Result Value Ref Range   Beta-Hydroxybutyric Acid 5.13 (H) 0.05 - 0.27 mmol/L  Beta-hydroxybutyric acid  Result Value Ref Range   Beta-Hydroxybutyric Acid 3.54 (H) 0.05 - 0.27 mmol/L  Glucose, capillary  Result Value Ref Range   Glucose-Capillary 301 (H) 70 - 99 mg/dL  Glucose, capillary  Result Value Ref Range   Glucose-Capillary 319 (H) 70 - 99 mg/dL  HIV Antibody (routine testing w rflx)  Result Value Ref Range   HIV Screen 4th Generation wRfx NON REACTIVE NON REACTIVE  Basic metabolic panel  Result Value Ref Range   Sodium 136 135 - 145 mmol/L   Potassium 3.3 (L) 3.5 - 5.1 mmol/L   Chloride 108 98 - 111 mmol/L   CO2 14 (L) 22 - 32 mmol/L   Glucose, Bld 248 (H) 70 - 99 mg/dL   BUN 10 6 - 20 mg/dL   Creatinine, Ser 0.85 0.61 - 1.24 mg/dL   Calcium 8.4 (L) 8.9 - 10.3 mg/dL   GFR calc non Af Amer >60 >60 mL/min   GFR calc Af Amer >60 >60 mL/min   Anion gap 14 5 - 15  Basic metabolic panel  Result Value Ref Range   Sodium 138 135 - 145 mmol/L   Potassium 3.4 (L) 3.5 - 5.1 mmol/L   Chloride  111 98 - 111 mmol/L   CO2 17 (L) 22 - 32 mmol/L   Glucose, Bld 159 (H) 70 - 99 mg/dL   BUN 9 6 - 20 mg/dL   Creatinine, Ser 0.59 (L) 0.61 - 1.24 mg/dL   Calcium 8.1 (L) 8.9 - 10.3 mg/dL   GFR calc non Af Amer >60 >60 mL/min   GFR calc Af Amer >60 >60 mL/min   Anion gap 10 5 - 15  Beta-hydroxybutyric acid  Result Value Ref Range   Beta-Hydroxybutyric Acid 5.37 (H) 0.05 - 0.27 mmol/L  Beta-hydroxybutyric acid  Result Value Ref Range   Beta-Hydroxybutyric Acid 3.57 (H) 0.05 - 0.27 mmol/L  Hemoglobin A1c  Result Value Ref Range   Hgb A1c MFr Bld 15.2 (H) 4.8 - 5.6 %   Mean Plasma Glucose 389.54 mg/dL  Glucose, capillary  Result Value Ref Range   Glucose-Capillary 176 (H) 70 - 99 mg/dL  Glucose, capillary  Result Value Ref Range   Glucose-Capillary 167 (H) 70 - 99 mg/dL  Glucose, capillary  Result Value Ref Range   Glucose-Capillary 137 (H) 70 - 99 mg/dL  Glucose, capillary  Result Value Ref Range   Glucose-Capillary 160 (H) 70 - 99 mg/dL  Glucose, capillary  Result Value Ref Range   Glucose-Capillary 151 (H) 70 - 99 mg/dL  Glucose, capillary  Result Value Ref Range   Glucose-Capillary 117 (H) 70 - 99 mg/dL  Glucose, capillary  Result Value Ref Range   Glucose-Capillary 139 (H) 70 - 99 mg/dL   Comment 1 Notify RN   Glucose, capillary  Result Value Ref Range   Glucose-Capillary 147 (H) 70 - 99 mg/dL  Glucose, capillary  Result Value Ref Range   Glucose-Capillary 178 (H) 70 - 99 mg/dL  Glucose, capillary  Result Value Ref Range   Glucose-Capillary 238 (  H) 70 - 99 mg/dL   Comment 1 Notify RN   Glucose, capillary  Result Value Ref Range   Glucose-Capillary 195 (H) 70 - 99 mg/dL  Glucose, capillary  Result Value Ref Range   Glucose-Capillary 250 (H) 70 - 99 mg/dL  Basic metabolic panel  Result Value Ref Range   Sodium 139 135 - 145 mmol/L   Potassium 3.2 (L) 3.5 - 5.1 mmol/L   Chloride 106 98 - 111 mmol/L   CO2 23 22 - 32 mmol/L   Glucose, Bld 237 (H) 70 - 99  mg/dL   BUN 6 6 - 20 mg/dL   Creatinine, Ser 0.56 (L) 0.61 - 1.24 mg/dL   Calcium 8.3 (L) 8.9 - 10.3 mg/dL   GFR calc non Af Amer >60 >60 mL/min   GFR calc Af Amer >60 >60 mL/min   Anion gap 10 5 - 15  Glucose, capillary  Result Value Ref Range   Glucose-Capillary 343 (H) 70 - 99 mg/dL  Glucose, capillary  Result Value Ref Range   Glucose-Capillary 321 (H) 70 - 99 mg/dL  Glucose, capillary  Result Value Ref Range   Glucose-Capillary 215 (H) 70 - 99 mg/dL   Comment 1 Notify RN   Glucose, capillary  Result Value Ref Range   Glucose-Capillary 236 (H) 70 - 99 mg/dL       Assessment & Plan:   1. diabetes mellitus without complication, without long-term current use of insulin (HCC)  Educated patient about this diagnosis, and why this is likely type 2 diabetes, although DKA is often not a typical feature of type 2 diabetes.  (But can occur).  Also, DKA is not an absolute indicator that long-term insulin will be required.  There are some antibody test that can sometimes help distinguish type I from type II, and given his age of onset relatively young, the DKA episode, and his BMI not particularly high, may be something to consider.  His strong family history would favor type 2 diabetes.  His sugars presently are not as well-controlled as we would like, and emphasized the importance of very good control in the future.  Discussed the importance of adding a statin product if his cholesterol panel is abnormal (mainly the LDL, with a goal of being less than 70).  Also noted checking the urine for microalbumin. It was felt helpful to get endocrine involved sooner to help with input if insulin is needed, the best regimen to continue with excellent control, and weigh in on type I versus type II and providing the best care for the patient presently.  They were consulted.  He was very much in agreement with this. - BASIC METABOLIC PANEL WITH GFR - Lipid panel - Hemoglobin A1c - POCT UA -  Microalbumin - Ambulatory referral to Endocrinology  2. Diabetic ketoacidosis without coma associated with type 2 diabetes mellitus ([place])  As noted above, and important to try to ensure this does not happen again in the future. - Hemoglobin A1c  3. Tobacco dependence Emphasized the importance of complete tobacco cessation, and trying the nicotine supplements presently, and also there are medicines that may help as well.  Needs to be committed for these types of entities to be helpful, and he wants to try  the nicotine supplements presently.  Await endocrine input, and schedule a follow-up in 6 weeks time to reassess, sooner as needed.       Towanda Malkin, MD 08/29/19 10:11 AM

## 2019-08-29 ENCOUNTER — Other Ambulatory Visit: Payer: Self-pay

## 2019-08-29 ENCOUNTER — Ambulatory Visit (INDEPENDENT_AMBULATORY_CARE_PROVIDER_SITE_OTHER): Payer: Self-pay | Admitting: Internal Medicine

## 2019-08-29 ENCOUNTER — Encounter: Payer: Self-pay | Admitting: Internal Medicine

## 2019-08-29 VITALS — BP 102/62 | HR 92 | Temp 97.9°F | Resp 16 | Ht 72.0 in | Wt 209.5 lb

## 2019-08-29 DIAGNOSIS — F17200 Nicotine dependence, unspecified, uncomplicated: Secondary | ICD-10-CM | POA: Insufficient documentation

## 2019-08-29 DIAGNOSIS — E1110 Type 2 diabetes mellitus with ketoacidosis without coma: Secondary | ICD-10-CM | POA: Insufficient documentation

## 2019-08-29 DIAGNOSIS — E119 Type 2 diabetes mellitus without complications: Secondary | ICD-10-CM | POA: Insufficient documentation

## 2019-08-29 LAB — POCT UA - MICROALBUMIN: Microalbumin Ur, POC: 50 mg/L

## 2019-08-29 NOTE — Patient Instructions (Signed)
Type 2 Diabetes Mellitus, Self Care, Adult When you have type 2 diabetes (type 2 diabetes mellitus), you must make sure your blood sugar (glucose) stays in a healthy range. You can do this with:  Nutrition.  Exercise.  Lifestyle changes.  Medicines or insulin, if needed.  Support from your doctors and others. How to stay aware of blood sugar   Check your blood sugar level every day, as often as told.  Have your A1c (hemoglobin A1c) level checked two or more times a year. Have it checked more often if your doctor tells you to. Your doctor will set personal treatment goals for you. Generally, you should have these blood sugar levels:  Before meals (preprandial): 80-130 mg/dL (4.4-7.2 mmol/L).  After meals (postprandial): below 180 mg/dL (10 mmol/L).  A1c level: less than 7%. How to manage high and low blood sugar Signs of high blood sugar High blood sugar is called hyperglycemia. Know the signs of high blood sugar. Signs may include:  Feeling: ? Thirsty. ? Hungry. ? Very tired.  Needing to pee (urinate) more than usual.  Blurry vision. Signs of low blood sugar Low blood sugar is called hypoglycemia. This is when blood sugar is at or below 70 mg/dL (3.9 mmol/L). Signs may include:  Feeling: ? Hungry. ? Worried or nervous (anxious). ? Sweaty and clammy. ? Confused. ? Dizzy. ? Sleepy. ? Sick to your stomach (nauseous).  Having: ? A fast heartbeat. ? A headache. ? A change in your vision. ? Jerky movements that you cannot control (seizure). ? Tingling or no feeling (numbness) around your mouth, lips, or tongue.  Having trouble with: ? Moving (coordination). ? Sleeping. ? Passing out (fainting). ? Getting upset easily (irritability). Treating low blood sugar To treat low blood sugar, eat or drink something sugary right away. If you can think clearly and swallow safely, follow the 15:15 rule:  Take 15 grams of a fast-acting carb (carbohydrate). Talk with your  doctor about how much you should take.  Some fast-acting carbs are: ? Sugar tablets (glucose pills). Take 3-4 pills. ? 6-8 pieces of hard candy. ? 4-6 oz (120-150 mL) of fruit juice. ? 4-6 oz (120-150 mL) of regular (not diet) soda. ? 1 Tbsp (15 mL) honey or sugar.  Check your blood sugar 15 minutes after you take the carb.  If your blood sugar is still at or below 70 mg/dL (3.9 mmol/L), take 15 grams of a carb again.  If your blood sugar does not go above 70 mg/dL (3.9 mmol/L) after 3 tries, get help right away.  After your blood sugar goes back to normal, eat a meal or a snack within 1 hour. Treating very low blood sugar If your blood sugar is at or below 54 mg/dL (3 mmol/L), you have very low blood sugar (severe hypoglycemia). This is an emergency. Do not wait to see if the symptoms will go away. Get medical help right away. Call your local emergency services (911 in the [place] If you have very low blood sugar and you cannot eat or drink, you may need a glucagon shot (injection). A family member or friend should learn how to check your blood sugar and how to give you a glucagon shot. Ask your doctor if you need to have a glucagon shot kit at home. Follow these instructions at home: Medicine  Take insulin and diabetes medicines as told.  If your doctor says you should take more or less insulin and medicines, do this exactly as told.  Do not run out of insulin or medicines. Having diabetes can raise your risk for other long-term conditions. These include heart disease and kidney disease. Your doctor may prescribe medicines to help you not have these problems. Food   Make healthy food choices. These include: ? Chicken, fish, egg whites, and beans. ? Oats, whole wheat, bulgur, brown rice, quinoa, and millet. ? Fresh fruits and vegetables. ? Low-fat dairy products. ? Nuts, avocado, olive oil, and canola oil.  Meet with a food specialist (dietitian). He or she can help you make an  eating plan that is right for you.  Follow instructions from your doctor about what you cannot eat or drink.  Drink enough fluid to keep your pee (urine) pale yellow.  Keep track of carbs that you eat. Do this by reading food labels and learning food serving sizes.  Follow your sick day plan when you cannot eat or drink normally. Make this plan with your doctor so it is ready to use. Activity  Exercise 3 or more times a week.  Do not go more than 2 days without exercising.  Talk with your doctor before you start a new exercise. Your doctor may need to tell you to change: ? How much insulin or medicines you take. ? How much food you eat. Lifestyle  Do not use any tobacco products. These include cigarettes, chewing tobacco, and e-cigarettes. If you need help quitting, ask your doctor.  Ask your doctor how much alcohol is safe for you.  Learn to deal with stress. If you need help with this, ask your doctor. Body care   Stay up to date with your shots (immunizations).  Have your eyes and feet checked by a doctor as often as told.  Check your skin and feet every day. Check for cuts, bruises, redness, blisters, or sores.  Brush your teeth and gums two times a day. Floss one or more times a day.  Go to the dentist one or more times every 6 months.  Stay at a healthy weight. General instructions  Take over-the-counter and prescription medicines only as told by your doctor.  Share your diabetes care plan with: ? Your work or school. ? People you live with.  Carry a card or wear jewelry that says you have diabetes.  Keep all follow-up visits as told by your doctor. This is important. Questions to ask your doctor  Do I need to meet with a diabetes educator?  Where can I find a support group for people with diabetes? Where to find more information To learn more about diabetes, visit:  American Diabetes Association: www.diabetes.org  American Association of Diabetes  Educators: www.diabeteseducator.org Summary  When you have type 2 diabetes, you must make sure your blood sugar (glucose) stays in a healthy range.  Check your blood sugar every day, as often as told.  Having diabetes can raise your risk for other conditions. Your doctor may prescribe medicines to help you not have these problems.  Keep all follow-up visits as told by your doctor. This is important. This information is not intended to replace advice given to you by your health care provider. Make sure you discuss any questions you have with your health care provider. Document Revised: 12/20/2017 Document Reviewed: 08/02/2015 Elsevier Patient Education  2020 Elsevier Inc.   Diabetes Mellitus and Nutrition, Adult When you have diabetes (diabetes mellitus), it is very important to have healthy eating habits because your blood sugar (glucose) levels are greatly affected by what you   eat and drink. Eating healthy foods in the appropriate amounts, at about the same times every day, can help you:  Control your blood glucose.  Lower your risk of heart disease.  Improve your blood pressure.  Reach or maintain a healthy weight. Every person with diabetes is different, and each person has different needs for a meal plan. Your health care provider may recommend that you work with a diet and nutrition specialist (dietitian) to make a meal plan that is best for you. Your meal plan may vary depending on factors such as:  The calories you need.  The medicines you take.  Your weight.  Your blood glucose, blood pressure, and cholesterol levels.  Your activity level.  Other health conditions you have, such as heart or kidney disease. How do carbohydrates affect me? Carbohydrates, also called carbs, affect your blood glucose level more than any other type of food. Eating carbs naturally raises the amount of glucose in your blood. Carb counting is a method for keeping track of how many carbs you  eat. Counting carbs is important to keep your blood glucose at a healthy level, especially if you use insulin or take certain oral diabetes medicines. It is important to know how many carbs you can safely have in each meal. This is different for every person. Your dietitian can help you calculate how many carbs you should have at each meal and for each snack. Foods that contain carbs include:  Bread, cereal, rice, pasta, and crackers.  Potatoes and corn.  Peas, beans, and lentils.  Milk and yogurt.  Fruit and juice.  Desserts, such as cakes, cookies, ice cream, and candy. How does alcohol affect me? Alcohol can cause a sudden decrease in blood glucose (hypoglycemia), especially if you use insulin or take certain oral diabetes medicines. Hypoglycemia can be a life-threatening condition. Symptoms of hypoglycemia (sleepiness, dizziness, and confusion) are similar to symptoms of having too much alcohol. If your health care provider says that alcohol is safe for you, follow these guidelines:  Limit alcohol intake to no more than 1 drink per day for nonpregnant women and 2 drinks per day for men. One drink equals 12 oz of beer, 5 oz of wine, or 1 oz of hard liquor.  Do not drink on an empty stomach.  Keep yourself hydrated with water, diet soda, or unsweetened iced tea.  Keep in mind that regular soda, juice, and other mixers may contain a lot of sugar and must be counted as carbs. What are tips for following this plan?  Reading food labels  Start by checking the serving size on the "Nutrition Facts" label of packaged foods and drinks. The amount of calories, carbs, fats, and other nutrients listed on the label is based on one serving of the item. Many items contain more than one serving per package.  Check the total grams (g) of carbs in one serving. You can calculate the number of servings of carbs in one serving by dividing the total carbs by 15. For example, if a food has 30 g of total  carbs, it would be equal to 2 servings of carbs.  Check the number of grams (g) of saturated and trans fats in one serving. Choose foods that have low or no amount of these fats.  Check the number of milligrams (mg) of salt (sodium) in one serving. Most people should limit total sodium intake to less than 2,300 mg per day.  Always check the nutrition information of foods labeled   as "low-fat" or "nonfat". These foods may be higher in added sugar or refined carbs and should be avoided.  Talk to your dietitian to identify your daily goals for nutrients listed on the label. Shopping  Avoid buying canned, premade, or processed foods. These foods tend to be high in fat, sodium, and added sugar.  Shop around the outside edge of the grocery store. This includes fresh fruits and vegetables, bulk grains, fresh meats, and fresh dairy. Cooking  Use low-heat cooking methods, such as baking, instead of high-heat cooking methods like deep frying.  Cook using healthy oils, such as olive, canola, or sunflower oil.  Avoid cooking with butter, cream, or high-fat meats. Meal planning  Eat meals and snacks regularly, preferably at the same times every day. Avoid going long periods of time without eating.  Eat foods high in fiber, such as fresh fruits, vegetables, beans, and whole grains. Talk to your dietitian about how many servings of carbs you can eat at each meal.  Eat 4-6 ounces (oz) of lean protein each day, such as lean meat, chicken, fish, eggs, or tofu. One oz of lean protein is equal to: ? 1 oz of meat, chicken, or fish. ? 1 egg. ?  cup of tofu.  Eat some foods each day that contain healthy fats, such as avocado, nuts, seeds, and fish. Lifestyle  Check your blood glucose regularly.  Exercise regularly as told by your health care provider. This may include: ? 150 minutes of moderate-intensity or vigorous-intensity exercise each week. This could be brisk walking, biking, or water  aerobics. ? Stretching and doing strength exercises, such as yoga or weightlifting, at least 2 times a week.  Take medicines as told by your health care provider.  Do not use any products that contain nicotine or tobacco, such as cigarettes and e-cigarettes. If you need help quitting, ask your health care provider.  Work with a counselor or diabetes educator to identify strategies to manage stress and any emotional and social challenges. Questions to ask a health care provider  Do I need to meet with a diabetes educator?  Do I need to meet with a dietitian?  What number can I call if I have questions?  When are the best times to check my blood glucose? Where to find more information:  American Diabetes Association: diabetes.org  Academy of Nutrition and Dietetics: www.eatright.org  National Institute of Diabetes and Digestive and Kidney Diseases (NIH): www.niddk.nih.gov Summary  A healthy meal plan will help you control your blood glucose and maintain a healthy lifestyle.  Working with a diet and nutrition specialist (dietitian) can help you make a meal plan that is best for you.  Keep in mind that carbohydrates (carbs) and alcohol have immediate effects on your blood glucose levels. It is important to count carbs and to use alcohol carefully. This information is not intended to replace advice given to you by your health care provider. Make sure you discuss any questions you have with your health care provider. Document Revised: 06/11/2017 Document Reviewed: 08/03/2016 Elsevier Patient Education  2020 Elsevier Inc.   

## 2019-08-30 ENCOUNTER — Other Ambulatory Visit: Payer: Self-pay | Admitting: Internal Medicine

## 2019-08-30 LAB — BASIC METABOLIC PANEL WITH GFR
BUN: 7 mg/dL (ref 7–25)
CO2: 26 mmol/L (ref 20–32)
Calcium: 9.5 mg/dL (ref 8.6–10.3)
Chloride: 108 mmol/L (ref 98–110)
Creat: 0.78 mg/dL (ref 0.60–1.35)
GFR, Est African American: 143 mL/min/{1.73_m2} (ref >=60)
GFR, Est Non African American: 124 mL/min/{1.73_m2} (ref >=60)
Glucose, Bld: 251 mg/dL — ABNORMAL HIGH (ref 65–99)
Potassium: 4.3 mmol/L (ref 3.5–5.3)
Sodium: 141 mmol/L (ref 135–146)

## 2019-08-30 LAB — HEMOGLOBIN A1C
Hgb A1c MFr Bld: 13.3 % of total Hgb — ABNORMAL HIGH (ref <5.7)
Mean Plasma Glucose: 335 (calc)
eAG (mmol/L): 18.6 (calc)

## 2019-08-30 LAB — LIPID PANEL
Cholesterol: 162 mg/dL (ref <200)
HDL: 38 mg/dL — ABNORMAL LOW (ref >=40)
LDL Cholesterol (Calc): 107 mg/dL (calc) — ABNORMAL HIGH
Non-HDL Cholesterol (Calc): 124 mg/dL (calc) (ref <130)
Total CHOL/HDL Ratio: 4.3 (calc) (ref <5.0)
Triglycerides: 76 mg/dL (ref <150)

## 2019-08-30 MED ORDER — ATORVASTATIN CALCIUM 10 MG PO TABS: 10.0000 mg | ORAL_TABLET | Freq: Every day | ORAL | 3 refills | Status: DC

## 2019-08-30 NOTE — Progress Notes (Unsigned)
Lipitor ordered after review of his labs. Goal is LDL < 70.

## 2019-08-30 NOTE — Progress Notes (Signed)
Melissa,  Please share the lab results with the patient as he is not on my chart. His glucose is high, not all that unexpected with his history shared yesterday, and his A1c remains high as expected, although it is a little less than previous. His basic metabolic panel was all normal which includes his electrolytes and kidney function. His lipid panel showed that the LDL cholesterol (lousy type) was 107, and as we discussed during his visit, the goal for this is less than 70.  We did mention if this was high, starting a statin product to help get this below 70.  I will prescribe that medication to his pharmacy, and he can pick that up and start taking once a day. It is very important to get endocrinology input here as we discussed as well in the very near future to help with a regimen to get his sugars very well controlled.  Thanks, [facility]

## 2019-09-05 ENCOUNTER — Emergency Department: Payer: Self-pay

## 2019-09-05 ENCOUNTER — Emergency Department
Admission: EM | Admit: 2019-09-05 | Discharge: 2019-09-05 | Discharge status: Against Medical Advice | Payer: Self-pay | Attending: Emergency Medicine | Admitting: Emergency Medicine

## 2019-09-05 ENCOUNTER — Other Ambulatory Visit: Payer: Self-pay

## 2019-09-05 ENCOUNTER — Encounter: Payer: Self-pay | Admitting: Emergency Medicine

## 2019-09-05 DIAGNOSIS — E119 Type 2 diabetes mellitus without complications: Secondary | ICD-10-CM | POA: Insufficient documentation

## 2019-09-05 DIAGNOSIS — K819 Cholecystitis, unspecified: Secondary | ICD-10-CM | POA: Insufficient documentation

## 2019-09-05 DIAGNOSIS — K828 Other specified diseases of gallbladder: Secondary | ICD-10-CM

## 2019-09-05 DIAGNOSIS — F17210 Nicotine dependence, cigarettes, uncomplicated: Secondary | ICD-10-CM | POA: Insufficient documentation

## 2019-09-05 DIAGNOSIS — Z794 Long term (current) use of insulin: Secondary | ICD-10-CM | POA: Insufficient documentation

## 2019-09-05 DIAGNOSIS — Z79899 Other long term (current) drug therapy: Secondary | ICD-10-CM | POA: Insufficient documentation

## 2019-09-05 DIAGNOSIS — J45909 Unspecified asthma, uncomplicated: Secondary | ICD-10-CM | POA: Insufficient documentation

## 2019-09-05 LAB — COMPREHENSIVE METABOLIC PANEL
ALT: 14 U/L (ref 0–44)
AST: 15 U/L (ref 15–41)
Albumin: 4.1 g/dL (ref 3.5–5.0)
Alkaline Phosphatase: 69 U/L (ref 38–126)
Anion gap: 11 (ref 5–15)
BUN: 6 mg/dL (ref 6–20)
CO2: 25 mmol/L (ref 22–32)
Calcium: 8.8 mg/dL — ABNORMAL LOW (ref 8.9–10.3)
Chloride: 105 mmol/L (ref 98–111)
Creatinine, Ser: 1.21 mg/dL (ref 0.61–1.24)
GFR calc Af Amer: >60 mL/min (ref >60)
GFR calc non Af Amer: >60 mL/min (ref >60)
Glucose, Bld: 174 mg/dL — ABNORMAL HIGH (ref 70–99)
Potassium: 3.8 mmol/L (ref 3.5–5.1)
Sodium: 141 mmol/L (ref 135–145)
Total Bilirubin: 0.8 mg/dL (ref 0.3–1.2)
Total Protein: 7.3 g/dL (ref 6.5–8.1)

## 2019-09-05 LAB — URINALYSIS, COMPLETE (UACMP) WITH MICROSCOPIC
Bacteria, UA: NONE SEEN
Bilirubin Urine: NEGATIVE
Glucose, UA: NEGATIVE mg/dL
Hgb urine dipstick: NEGATIVE
Ketones, ur: NEGATIVE mg/dL
Leukocytes,Ua: NEGATIVE
Nitrite: NEGATIVE
Protein, ur: 30 mg/dL — AB
Specific Gravity, Urine: 1.003 — ABNORMAL LOW (ref 1.005–1.030)
Squamous Epithelial / HPF: NONE SEEN (ref 0–5)
pH: 6 (ref 5.0–8.0)

## 2019-09-05 LAB — CBC
HCT: 40.8 % (ref 39.0–52.0)
Hemoglobin: 13.6 g/dL (ref 13.0–17.0)
MCH: 32.4 pg (ref 26.0–34.0)
MCHC: 33.3 g/dL (ref 30.0–36.0)
MCV: 97.1 fL (ref 80.0–100.0)
Platelets: 178 10*3/uL (ref 150–400)
RBC: 4.2 MIL/uL — ABNORMAL LOW (ref 4.22–5.81)
RDW: 12.1 % (ref 11.5–15.5)
WBC: 5.4 10*3/uL (ref 4.0–10.5)
nRBC: 0 % (ref 0.0–0.2)

## 2019-09-05 LAB — LIPASE, BLOOD: Lipase: 31 U/L (ref 11–51)

## 2019-09-05 MED ORDER — AMOXICILLIN-POT CLAVULANATE 875-125 MG PO TABS
1.0000 | ORAL_TABLET | Freq: Once | ORAL | Status: AC
  Administered 2019-09-05: 1 via ORAL
  Filled 2019-09-05: qty 1

## 2019-09-05 MED ORDER — METOCLOPRAMIDE HCL 5 MG/ML IJ SOLN
10.0000 mg | Freq: Once | INTRAMUSCULAR | Status: AC
  Administered 2019-09-05: 19:00:00 10 mg via INTRAVENOUS
  Filled 2019-09-05: qty 2

## 2019-09-05 MED ORDER — IOHEXOL 300 MG/ML  SOLN
100.0000 mL | Freq: Once | INTRAMUSCULAR | Status: AC | PRN
  Administered 2019-09-05: 20:00:00 100 mL via INTRAVENOUS
  Filled 2019-09-05: qty 100

## 2019-09-05 MED ORDER — SODIUM CHLORIDE 0.9 % IV BOLUS
1000.0000 mL | Freq: Once | INTRAVENOUS | Status: AC
  Administered 2019-09-05: 1000 mL via INTRAVENOUS

## 2019-09-05 MED ORDER — ONDANSETRON HCL 4 MG PO TABS: 4.0000 mg | ORAL_TABLET | Freq: Three times a day (TID) | ORAL | 0 refills | Status: DC | PRN

## 2019-09-05 MED ORDER — AMOXICILLIN-POT CLAVULANATE 875-125 MG PO TABS: 1.0000 | ORAL_TABLET | Freq: Three times a day (TID) | ORAL | 0 refills | Status: AC

## 2019-09-05 MED ORDER — DICYCLOMINE HCL 10 MG/ML IM SOLN
20.0000 mg | Freq: Once | INTRAMUSCULAR | Status: AC
  Administered 2019-09-05: 19:00:00 20 mg via INTRAMUSCULAR
  Filled 2019-09-05 (×2): qty 2

## 2019-09-05 NOTE — ED Notes (Signed)
Pt taken to CT via wheelchair.

## 2019-09-05 NOTE — ED Notes (Signed)
Pt requesting water. EDP informed. Stated he will be in room shortly to assess him.

## 2019-09-05 NOTE — ED Provider Notes (Signed)
Patient was evaluated by Dr. Tonna Boehringer with surgery.  He did recommend admission for surgery for cholecystitis.  Patient however declined.  Will discharge with nausea medicine as well as antibiotics.  Patient was given Dr. Geoffery Lyons business card by Dr. Tonna Boehringer however I will also put his information on the discharge paperwork.   Phineas Semen, MD 09/05/19 [id number]

## 2019-09-05 NOTE — ED Notes (Signed)
Pt came out to nurses desk saying "I need water right now. I don't care. I need water." pt throwing arms around in the air. Pt informed that EDP has to assess him before pt is allowed to eat or drink. Pt continued to raise voice towards staff saying "I don't care. I need water now." EDP overheard and stated he could have some water.

## 2019-09-05 NOTE — ED Notes (Signed)
Pt given water per request

## 2019-09-05 NOTE — ED Notes (Signed)
Pt ambulatory to bathroom independently

## 2019-09-05 NOTE — Discharge Instructions (Addendum)
Please seek medical attention for any high fevers, chest pain, shortness of breath, change in behavior, persistent vomiting, bloody stool or any other new or concerning symptoms.  

## 2019-09-05 NOTE — ED Triage Notes (Addendum)
Presents with some n/v since 7a   Some abd and lower back pain  States he has vomited about 5-7 times

## 2019-09-05 NOTE — ED Provider Notes (Signed)
[facility] Emergency Department Provider Note ____________________________________________   First MD Initiated Contact with Patient 09/05/19 1826     (approximate)  I have reviewed the triage vital signs and the nursing notes.   HISTORY  Chief Complaint Abdominal Pain and Emesis    HPI Jerome Murphy is a 29 y.o. male with PMH as noted below including type 2 diabetes who presents with nausea and vomiting, acute onset around 10 hours ago, persistent course since then, described as somewhat yellow or bilious but nonbloody.  It is associated with crampy diffuse abdominal pain especially right before he vomits.  The patient also reports some bilateral back pain.  He has had no diarrhea, but has not had a bowel movement today.  He states he was unable to take his insulin or oral diabetes medication.  Past Medical History:  Diagnosis Date  . Asthma   . Diabetes mellitus without complication (HCC)   . Narcolepsy   . Seizures [facility])     Patient Active Problem List   Diagnosis Date Noted  . Tobacco dependence 08/29/2019  . Type 2 diabetes mellitus without complication, without long-term current use of insulin (HCC) 08/29/2019  . DKA (diabetic ketoacidoses) (HCC) 08/05/2019  . Non compliance w medication regimen 08/05/2019    Past Surgical History:  Procedure Laterality Date  . CIRCUMCISION    . HERNIA REPAIR    . TONSILLECTOMY      Prior to Admission medications   Medication Sig Start Date End Date Taking? Authorizing Provider  atorvastatin (LIPITOR) 10 MG tablet Take 1 tablet (10 mg total) by mouth daily. 08/30/19   Jamelle Haring, MD  Insulin NPH, Human,, Isophane, (NOVOLIN N FLEXPEN RELION) 100 UNIT/ML Kiwkpen Inject 5 Units into the skin 2 (two) times daily. 08/06/19   Lynn Ito, MD  Insulin Pen Needle 32G X 4 MM MISC 1 application by Does not apply route 2 (two) times daily. 08/06/19   Lynn Ito, MD  metFORMIN (GLUCOPHAGE)  500 MG tablet Take 1 tablet (500 mg total) by mouth 2 (two) times daily with a meal. 11/25/18 11/25/19  Phineas Semen, MD    Allergies Robitussin [guaifenesin]  No family history on file.  Social History Social History   Tobacco Use  . Smoking status: Current Every Day Smoker    Packs/day: 0.25    Types: Cigarettes  . Smokeless tobacco: Never Used  Substance Use Topics  . Alcohol use: No    Comment: occasionallly  . Drug use: No    Review of Systems  Constitutional: No fever. Eyes: No redness. ENT: No sore throat. Cardiovascular: Denies chest pain. Respiratory: Denies shortness of breath. Gastrointestinal: Positive for nausea and vomiting. Genitourinary: Negative for dysuria.  Musculoskeletal: Negative for back pain. Skin: Negative for rash. Neurological: Negative for headache.   ____________________________________________   PHYSICAL EXAM:  VITAL SIGNS: ED Triage Vitals [09/05/19 1717]  Enc Vitals Group     BP (!) 150/83     Pulse Rate (!) 58     Resp      Temp 97.9 F (36.6 C)     Temp Source Oral     SpO2 100 %     Weight      Height      Head Circumference      Peak Flow      Pain Score      Pain Loc      Pain Edu?      Excl. in GC?  Constitutional: Alert and oriented.  Uncomfortable appearing but in no acute distress. Eyes: Conjunctivae are normal.  No scleral icterus. Head: Atraumatic. Nose: No congestion/rhinnorhea. Mouth/Throat: Mucous membranes are moist.   Neck: Normal range of motion.  Cardiovascular:  Good peripheral circulation. Respiratory: Normal respiratory effort.  No retractions.  Gastrointestinal: Soft with mild bilateral lower quadrant tenderness.  No distention.  Genitourinary: No flank tenderness. Musculoskeletal: Extremities warm and well perfused.  Neurologic:  Normal speech and language. No gross focal neurologic deficits are appreciated.  Skin:  Skin is warm and dry. No rash noted. Psychiatric: Mood and affect are  normal. Speech and behavior are normal.  ____________________________________________   LABS (all labs ordered are listed, but only abnormal results are displayed)  Labs Reviewed  COMPREHENSIVE METABOLIC PANEL - Abnormal; Notable for the following components:      Result Value   Glucose, Bld 174 (*)    Calcium 8.8 (*)    All other components within normal limits  CBC - Abnormal; Notable for the following components:   RBC 4.20 (*)    All other components within normal limits  URINALYSIS, COMPLETE (UACMP) WITH MICROSCOPIC - Abnormal; Notable for the following components:   Color, Urine STRAW (*)    APPearance CLEAR (*)    Specific Gravity, Urine 1.003 (*)    Protein, ur 30 (*)    All other components within normal limits  LIPASE, BLOOD   ____________________________________________  EKG  _______________________________________  RADIOLOGY  CT abdomen: Pericholecystic fluid concerning for acute cholecystitis [place] RUQ: Gallbladder wall edema and pericholecystic fluid concerning for acute cholecystitis.  No gallstones seen.  ____________________________________________   PROCEDURES  Procedure(s) performed: No  Procedures  Critical Care performed: No ____________________________________________   INITIAL IMPRESSION / ASSESSMENT AND PLAN / ED COURSE  Pertinent labs & imaging results that were available during my care of the patient were reviewed by me and considered in my medical decision making (see chart for details).  29 year old male with history of type 2 diabetes on insulin and other PMH as noted above presents with persistent nausea and vomiting since around 7 AM associated with crampy diffuse abdominal pain.  The patient states that he has not had a bowel movement today and has not really been passing gas.  I reviewed the past medical records in [place].  The patient was admitted last month for DKA.  On exam, the patient is uncomfortable but not acutely  ill-appearing.  His vital signs are normal except for hypertension.  The abdomen is soft with mild bilateral lower quadrant tenderness.  Initial labs reveal a normal anion gap, glucose of 174, and no ketones on the urinalysis.  The WBC count is normal.  The lipase is also normal.  Therefore, there is no evidence of DKA.  Differential includes gastroenteritis, foodborne illness, gastroparesis, gastritis, or less likely SBO, colitis, or other intra-abdominal etiology.  The patient has 6-10 WBCs on his urinalysis, however overall UTI/pyelonephritis is less consistent with his symptoms.  We will give fluids, antiemetic, antispasmodic, and obtain a CT to further evaluate.  ----------------------------------------- 10:13 PM on 09/05/2019 -----------------------------------------  CT showed pericholecystic fluid, so I proceeded with a right upper quadrant ultrasound.  This shows findings concerning for acute cholecystitis.  However, there are no gallstones.  The patient does not really have specific focal tenderness in this area, and his lab work-up also is not remarkable.  I consulted Dr. Lysle Pearl from general surgery to evaluate.  I have signed the patient out to the oncoming  physician Dr. Derrill Kay, pending recommendations from surgery.  ____________________________________________   FINAL CLINICAL IMPRESSION(S) / ED DIAGNOSES  Final diagnoses:  Cholecystitis  Thickening of wall of gallbladder      NEW MEDICATIONS STARTED DURING THIS VISIT:  New Prescriptions   No medications on file     Note:  This document was prepared using Dragon voice recognition software and may include unintentional dictation errors.    Dionne Bucy, MD 09/05/19 2214

## 2019-09-05 NOTE — ED Notes (Addendum)
MD at bedside to update patient on results. Pt vomiting again and MD aware.

## 2019-09-05 NOTE — Consult Note (Signed)
Subjective:   CC: acute cholecystitis  HPI:  Jerome Murphy is a 29 y.o. male who is consulted by [facility] for evaluation of above cc.  Symptoms were first noted 4 days ago. Pain was sharp,  Associated with nausea/vomiting, exacerbated by nothing specific     Past Medical History:  has a past medical history of Asthma, Diabetes mellitus without complication (HCC), Narcolepsy, and Seizures (HCC).  Past Surgical History:  has a past surgical history that includes Hernia repair; Tonsillectomy; and Circumcision.  Family History: family history is not on file.  Social History:  reports that he has been smoking cigarettes. He has been smoking about 0.25 packs per day. He has never used smokeless tobacco. He reports that he does not drink alcohol or use drugs.  Current Medications:  atorvastatin (LIPITOR) 10 MG tablet Take 1 tablet (10 mg total) by mouth daily. Jamelle Haring, MD Needs Review  Insulin NPH, Human,, Isophane, (NOVOLIN N FLEXPEN RELION) 100 UNIT/ML Kiwkpen Inject 5 Units into the skin 2 (two) times daily. Lynn Ito, MD Needs Review  Insulin Pen Needle 32G X 4 MM MISC 1 application by Does not apply route 2 (two) times daily. Lynn Ito, MD Needs Review  metFORMIN (GLUCOPHAGE) 500 MG tablet Take 1 tablet (500 mg total) by mouth 2 (two) times daily with a meal. Phineas Semen, MD Needs Review     Allergies:  Allergies as of 09/05/2019 - Review Complete 09/05/2019  Allergen Reaction Noted  . Robitussin [guaifenesin] Hives and Itching 04/18/2011    ROS:  General: Denies weight loss, weight gain, fatigue, fevers, chills, and night sweats. Eyes: Denies blurry vision, double vision, eye pain, itchy eyes, and tearing. Ears: Denies hearing loss, earache, and ringing in ears. Nose: Denies sinus pain, congestion, infections, runny nose, and nosebleeds. Mouth/throat: Denies hoarseness, sore throat, bleeding gums, and difficulty swallowing. Heart: Denies chest  pain, palpitations, racing heart, irregular heartbeat, leg pain or swelling, and decreased activity tolerance. Respiratory: Denies breathing difficulty, shortness of breath, wheezing, cough, and sputum. GI: Denies change in appetite, heartburn, nausea, vomiting, constipation, diarrhea, and blood in stool. GU: Denies difficulty urinating, pain with urinating, urgency, frequency, blood in urine. Musculoskeletal: Denies joint stiffness, pain, swelling, muscle weakness. Skin: Denies rash, itching, mass, tumors, sores, and boils Neurologic: Denies headache, fainting, dizziness, seizures, numbness, and tingling. Psychiatric: Denies depression, anxiety, difficulty sleeping, and memory loss. Endocrine: Denies heat or cold intolerance, and increased thirst or urination. Blood/lymph: Denies easy bruising, easy bruising, and swollen glands     Objective:     BP (!) 150/83 (BP Location: Left Arm)   Pulse (!) 51   Temp 98.4 F (36.9 C)   Resp 14   SpO2 98%    Constitutional :  alert, cooperative, appears stated age and no distress  Lymphatics/Throat:  no asymmetry, masses, or scars  Respiratory:  clear to auscultation bilaterally  Cardiovascular:  regular rate and rhythm  Gastrointestinal: soft, no guarding, but focal TTP in RUQ.   Musculoskeletal: Steady movement  Skin: Cool and moist  Psychiatric: Normal affect, non-agitated, not confused       LABS:  CMP Latest Ref Rng & Units 09/05/2019 08/29/2019 08/06/2019  Glucose 70 - 99 mg/dL 329(J) 242(A) 834(H)  BUN 6 - 20 mg/dL 6 7 6   Creatinine 0.61 - 1.24 mg/dL 9.62 2.29)  Sodium 135 - 145 mmol/L 141 141 139  Potassium 3.5 - 5.1 mmol/L 3.8 4.3 3.2(L)  Chloride 98 - 111 mmol/L 105 108 106  CO2 22 - 32 mmol/L 25 26 23   Calcium 8.9 - 10.3 mg/dL 8.8(L) 9.5 8.3(L)  Total Protein 6.5 - 8.1 g/dL 7.3 - -  Total Bilirubin 0.3 - 1.2 mg/dL 0.8 - -  Alkaline Phos 38 - 126 U/L 69 - -  AST 15 - 41 U/L 15 - -  ALT 0 - 44 U/L 14 - -   CBC  Latest Ref Rng & Units 09/05/2019 08/04/2019 05/30/2019  WBC 4.0 - 10.5 K/uL 5.4 6.7 8.0  Hemoglobin 13.0 - 17.0 g/dL 13.6 15.3 14.3  Hematocrit 39.0 - 52.0 % 40.8 43.0 40.9  Platelets 150 - 400 K/uL 178 179 175     RADS: CLINICAL DATA:  Abdominal pain, abnormal gallbladder on CT  EXAM: ULTRASOUND ABDOMEN LIMITED RIGHT UPPER QUADRANT  COMPARISON:  09/05/2019  FINDINGS: Gallbladder:  Gallbladder is moderately distended. There is diffuse gallbladder wall thickening with intramural edema consistent with acute cholecystitis. I do not see any shadowing gallstones. Patient is tender overlying the right upper quadrant without frank positive Murphy sign per sonographer.  Common bile duct:  Diameter: 4 mm  Liver:  Mild increased liver echotexture consistent with fatty infiltration as demonstrated on preceding CT. Portal vein is patent on color Doppler imaging with normal direction of blood flow towards the liver.  Other: None.  IMPRESSION: 1. Distended gallbladder, with gallbladder wall thickening and intramural edema compatible with acute cholecystitis. No shadowing gallstones. 2. Fatty liver.   Electronically Signed   By: Randa Ngo M.D.   On: 09/05/2019 21:39 Assessment:      Acute cholecystitis Type II DM  Plan:     Pt requesting outpt abx and interval chole, stating he does not want to stay in hospital.  We discussed risks of waiting and how typical treatment of cholecystitis is IV abx and surgery during same admission.  I explained to him if we proceed with interval chole, will recommend waiting out to at least a month if tolerable to minimize increased risk from operating on a subacute cholecystitis.    He verbalized understanding and still wishes to proceed with outpt f/u.  Office number provided and he stated he will call if any persistent symptoms.  He will also call office tomorrow am to tentatively schedule date of surgery as well.  Case  discussed with ED provider who verbalized understanding as well

## 2019-09-07 ENCOUNTER — Encounter: Payer: Self-pay | Admitting: Anesthesiology

## 2019-09-07 ENCOUNTER — Ambulatory Visit: Payer: Self-pay | Admitting: General Surgery

## 2019-09-07 ENCOUNTER — Ambulatory Visit: Admit: 2019-09-07 | Payer: Medicaid Other | Admitting: General Surgery

## 2019-09-07 ENCOUNTER — Encounter: Payer: Self-pay | Admitting: General Surgery

## 2019-09-07 SURGERY — CHOLECYSTECTOMY, ROBOT-ASSISTED, LAPAROSCOPIC
Anesthesia: General

## 2019-09-07 NOTE — Progress Notes (Signed)
Patient presented to the ED on 09/05/19. He was found with cholecystitis. Dr. Tonna Boehringer evaluated the patient and recommended surgical management. Patient wanted to try conservative management and requested abx therapy with interna cholecystectomy stating that he does not want to stay in the hospital.   Next day he called Dr. Tonna Boehringer reporting that the pain is not improving and that he was very uncomfortable. Again Dr. Tonna Boehringer recommended to got to ED and possible direct admission with cholecystectomy next day. Patient again requested not to be hospitalized. Dr. Tonna Boehringer contacted me to see if I was available to do cholecystectomy next day as outpatient. I agreed to proceed with cholecystectomy but cannot guaranteed that patient might need to stay overnight after surgery depending on finding since patient has had pain for 5 days and subacute cholecystitis with significant inflammation after reviewing the [place] and CT scan was expected.   Today patient was contacted to have Rapid COVID-19 testing done in the morning to proceed with surgery during the afternoon. Patient agreed initially and test was ordered by hospital staff. Patient did not showed up for testing. Surgery cancelled. From my standpoint, if patient continue having pain will need to go to ED for furhter evaluation. In my opinion, cholecystectomy is getting more risky as patient refused to have cholecystectomy earlier.

## 2019-10-23 NOTE — Progress Notes (Signed)
Patient ID: Jerome Murphy, male    DOB: [birth year], 29 y.o.   MRN: [id number]  PCP: Jamelle Haring, MD  Chief Complaint  Patient presents with  . Follow-up  . Diabetes    Subjective:   Jerome Murphy is a 29 y.o. male, presents to clinic with CC of the following:  Chief Complaint  Patient presents with  . Follow-up  . Diabetes    HPI:  Patient is a 29 year old male who was first seen at the practice 08/29/2019 with diabetes concerns and follows up today.  His last visit was a follow-up after being hospitalized on 08/06/19.  His past medical history is significant fortype 2 diabetes, diagnosed in May 2019 started on Metformin and stopped taking it 1 month later due to side effects (heart flutter noted). He went to the emergency room for several day history of progressive weakness and increased thirst, drinking up to 24 bottles of water in 2 days.He also reports unintentional weight loss. Also had polyuria.  Upon arrival in the emergency room, his blood work had a  blood sugar of 432 with a bicarb of 13 and an anion gap of 18. There were ketones in his urine. Potassium was 4.4 and sodium 132. White cell count was normal. Patient was given an IV fluid bolus and started on an insulin infusion. Hospitalist consulted for admission for DKA.Once his anion gap closed and patient was taking p.o. intake he was transitioned to subcu insulin. Diabetic educator was consulted. He was placed on a carb controlled diet. His hemoglobin A1c is 15.2. His beta hydroxybutyrate was improved. His potassium was mildly low and that was replaced. On discharge, time was spent educating patient on carb controlled diet, medical compliance, importance of getting his HA1C under control. Also the risk and complications of uncontrolled diabetes including amputation, ending up on dialysis, stroke, MI, and more were discussed. He verbalized understanding. Also was  counseled extensively on smoking cessation and risk and complications of ongoing smoking. He promised to make changes with his health andlifestyle.  Since our last visit, he notes his sugars have been better controlled. medication regimen - Insulin NPH 5U at 9 am and 5 U at 9 pm, Metformin 500mg  bid  although he ran out of his Metformin a couple weeks ago.  Not checking blood sugars regularly, once a day if can, although less recently, not since weekend and that was his last check. running between low 100's - 140's (no 200's in the past 4 weeks) No very low blood sugars on home checks Not drinking pops, sugary drinks, doing ok with diet, but admits not very strict, only eats one or two meals a day.  No CP, SOB, marked fatigue, urinary frequency, increased thirst, numbness or tingling in the extremities , mild loose BM's at times, better since off the metformin (ran out and been about 2 weeks).   Last eye exam - years ago,    Exercise - no regular exercise regimen (except 50 push ups a day), tries to be active    Lab results after that first visit that were shared with the patient with recommendations included: His glucose is high, not all that unexpected with his history shared yesterday, and his A1c remains high as expected, although it is a little less than previous. Lab Results  Component Value Date   HGBA1C 13.3 (H) 08/29/2019   His basic metabolic panel was all normal which includes his electrolytes and kidney function. His lipid  panel showed that the LDL cholesterol (lousy type) was 107, and as we discussed during his visit, the goal for this is less than 70.  We did mention if this was high, starting a statin product to help get this below 70.  I will prescribe that medication to his pharmacy, and he can pick that up and start taking once a day.  Has not been taking it. Lab Results  Component Value Date   CHOL 162 08/29/2019   HDL 38 (L) 08/29/2019   LDLCALC 107 (H)  08/29/2019   TRIG 76 08/29/2019   CHOLHDL 4.3 08/29/2019    It was felt very important to get endocrinology input to help with a regimen to get his sugars very well controlled and were consulted last visit.  He did not follow-up with them, and it was noted that they had difficulty reaching him in the chart.  He notes that he will still go to see them, and that they had not contacted him  Lab Results  Component Value Date   HGBA1C 13.3 (H) 08/29/2019   Still smoking, no luck decreasing. Has not tried nicotine supp's, and discussed today.   Also, in late February, he presented to the emergency room with abdominal pain, felt to have a cholecystitis and a cholecystectomy was recommended.  He did not want to have that done, and after leaving the hospital, called back noting the pain was still problematic, and arrangements were made for him to return for a Covid test and have the cholecystectomy done.  He did not return for that Covid test, and that procedure was then canceled.  He was to then follow-up in the emergency room again if having more pain for reassessment.  He notes that he has not had any recurrence of those pains in the recent past.   Patient Active Problem List   Diagnosis Date Noted  . Tobacco dependence 08/29/2019  . Type 2 diabetes mellitus without complication, without long-term current use of insulin (HCC) 08/29/2019  . DKA (diabetic ketoacidoses) (HCC) 08/05/2019  . Non compliance w medication regimen 08/05/2019      Current Outpatient Medications:  .  atorvastatin (LIPITOR) 10 MG tablet, Take 1 tablet (10 mg total) by mouth daily., Disp: 90 tablet, Rfl: 3 .  Insulin NPH, Human,, Isophane, (NOVOLIN N FLEXPEN RELION) 100 UNIT/ML Kiwkpen, Inject 5 Units into the skin 2 (two) times daily., Disp: 15 mL, Rfl: 0 .  Insulin Pen Needle 32G X 4 MM MISC, 1 application by Does not apply route 2 (two) times daily., Disp: 60 each, Rfl: 0 .  metFORMIN (GLUCOPHAGE) 500 MG tablet, Take  1 tablet (500 mg total) by mouth 2 (two) times daily with a meal. (Patient not taking: Reported on 10/24/2019), Disp: 60 tablet, Rfl: 2   Allergies  Allergen Reactions  . Robitussin [Guaifenesin] Hives and Itching     Past Surgical History:  Procedure Laterality Date  . CIRCUMCISION    . HERNIA REPAIR    . TONSILLECTOMY       History reviewed. No pertinent family history.   Social History   Tobacco Use  . Smoking status: Current Every Day Smoker    Packs/day: 0.25    Types: Cigarettes  . Smokeless tobacco: Never Used  Substance Use Topics  . Alcohol use: No    Comment: occasionallly    With staff assistance, above reviewed with the patient today.  ROS: As per HPI, otherwise no specific complaints on a limited and focused system  review   No results found for this or any previous visit (from the past 72 hour(s)).   PHQ2/9: Depression screen [facility] 2/9 10/24/2019 08/29/2019  Decreased Interest 0 0  Down, Depressed, Hopeless 0 0  PHQ - 2 Score 0 0  Altered sleeping 0 0  Tired, decreased energy 0 0  Change in appetite 0 0  Feeling bad or failure about yourself  0 0  Trouble concentrating 0 0  Moving slowly or fidgety/restless 0 0  Suicidal thoughts 0 0  PHQ-9 Score 0 0  Difficult doing work/chores Not difficult at all Not difficult at all   PHQ-2/9 Result is neg  Fall Risk: Fall Risk  10/24/2019 08/29/2019  Falls in the past year? 0 0  Number falls in past yr: 0 0  Injury with Fall? 0 0      Objective:   Vitals:   10/24/19 0955  BP: 118/70  Pulse: 95  Resp: 14  Temp: (!) 97.5 F (36.4 C)  TempSrc: Temporal  SpO2: 98%  Weight: 211 lb 6.4 oz (95.9 kg)  Height: 6' (1.829 m)    Body mass index is 28.67 kg/m.  Physical Exam   NAD, masked HEENT - [place]/AT, sclera anicteric, conj - non-inj'ed,  pharynx clear Neck - supple, no adenopathy, no TM, carotids 2+ and = without bruits bilat Car - RRR without m/g/r Pulm- RR and effort normal at rest, CTA without  wheeze or rales Abd - soft, NT, ND, BS+,  no masses, no hepatosplenomegaly Back - no CVA tenderness Ext - no LE edema,  Neuro/psychiatric - affect was not flat, appropriate with conversation  Alert and oriented  Grossly non-focal   Speech normal   Results for orders placed or performed during the hospital encounter of 09/05/19  Lipase, blood  Result Value Ref Range   Lipase 31 11 - 51 U/L  Comprehensive metabolic panel  Result Value Ref Range   Sodium 141 135 - 145 mmol/L   Potassium 3.8 3.5 - 5.1 mmol/L   Chloride 105 98 - 111 mmol/L   CO2 25 22 - 32 mmol/L   Glucose, Bld 174 (H) 70 - 99 mg/dL   BUN 6 6 - 20 mg/dL   Creatinine, Ser 7.67 0.61 - 1.24 mg/dL   Calcium 8.8 (L) 8.9 - 10.3 mg/dL   Total Protein 7.3 6.5 - 8.1 g/dL   Albumin 4.1 3.5 - 5.0 g/dL   AST 15 15 - 41 U/L   ALT 14 0 - 44 U/L   Alkaline Phosphatase 69 38 - 126 U/L   Total Bilirubin 0.8 0.3 - 1.2 mg/dL   GFR calc non Af Amer >60 >60 mL/min   GFR calc Af Amer >60 >60 mL/min   Anion gap 11 5 - 15  CBC  Result Value Ref Range   WBC 5.4 4.0 - 10.5 K/uL   RBC 4.20 (L) 4.22 - 5.81 MIL/uL   Hemoglobin 13.6 13.0 - 17.0 g/dL   HCT 34.1 93.7 - 90.2 %   MCV 97.1 80.0 - 100.0 fL   MCH 32.4 26.0 - 34.0 pg   MCHC 33.3 30.0 - 36.0 g/dL   RDW 40.9 73.5 - 32.9 %   Platelets 178 150 - 400 K/uL   nRBC 0.0 0.0 - 0.2 %  Urinalysis, Complete w Microscopic  Result Value Ref Range   Color, Urine STRAW (A) YELLOW   APPearance CLEAR (A) CLEAR   Specific Gravity, Urine 1.003 (L) 1.005 - 1.030   pH 6.0  5.0 - 8.0   Glucose, UA NEGATIVE NEGATIVE mg/dL   Hgb urine dipstick NEGATIVE NEGATIVE   Bilirubin Urine NEGATIVE NEGATIVE   Ketones, ur NEGATIVE NEGATIVE mg/dL   Protein, ur 30 (A) NEGATIVE mg/dL   Nitrite NEGATIVE NEGATIVE   Leukocytes,Ua NEGATIVE NEGATIVE   RBC / HPF 0-5 0 - 5 RBC/hpf   WBC, UA 6-10 0 - 5 WBC/hpf   Bacteria, UA NONE SEEN NONE SEEN   Squamous Epithelial / LPF NONE SEEN 0 - 5   Mucus PRESENT    Prior  labs after last visit reviewed    Assessment & Plan:   1. Type 2 diabetes mellitus with ketoacidosis without coma, without long-term current use of insulin ([place])  Again discussed the importance of getting endocrine's input, and noting that DKA is often not a typical feature of type 2 diabetes.  (But can occur).  Also, DKA is not an absolute indicator that long-term insulin will be required and the potential of options over time that may not include insulin may be entertained.  There are some antibody test that can sometimes help distinguish type I from type II, and given his age of onset relatively young, the DKA episode, and his BMI not particularly high, may be something to consider.  His strong family history would favor type 2 diabetes.   His sugars seem to be a little better controlled in the recent past, although has not been checking too frequently.  Felt best to continue the Metformin product as well as the insulin presently and await endocrine's input.  Another referral was placed, and asked Melissa to help with phone calls to the endocrinology office as well as giving the patient their number to make contact. Again discussed the importance of adding a statin product to his regimen, and encouraged him to take the statin product daily. We will also check a BMP and an A1c today.  - metFORMIN (GLUCOPHAGE) 500 MG tablet; Take 1 tablet (500 mg total) by mouth 2 (two) times daily with a meal.  Dispense: 60 tablet; Refill: 2 - Hemoglobin C1Y - BASIC METABOLIC PANEL WITH GFR - Ambulatory referral to Endocrinology  2. Tobacco dependence Emphasized the importance of complete tobacco cessation, and trying the nicotine supplements presently, and also there are medicines that may help as well.  Needs to be committed for these types of entities to be helpful, and he wants to try  the nicotine supplements presently.  I encouraged the gum products to help with the oral fixation of of the cigarettes, and  hope that he will try this.  3. Mixed hyperlipidemia As above, strongly encouraged him taking the statin product daily  4.  History of abdominal pain He was diagnosed with cholecystitis, and a cholecystectomy recommended, although he did not proceed, and symptoms have since resolved. Continue to monitor presently. His abdominal exam today was entirely unremarkable.  I will schedule follow-up with him again in 3 months at the latest, sooner as needed with endocrine's involvement in the interim as well anticipated.    Towanda Malkin, MD 10/24/19 10:02 AM .

## 2019-10-24 ENCOUNTER — Telehealth: Payer: Self-pay

## 2019-10-24 ENCOUNTER — Ambulatory Visit: Payer: Medicaid Other | Admitting: Internal Medicine

## 2019-10-24 ENCOUNTER — Other Ambulatory Visit: Payer: Self-pay

## 2019-10-24 ENCOUNTER — Encounter: Payer: Self-pay | Admitting: Internal Medicine

## 2019-10-24 VITALS — BP 118/70 | HR 95 | Temp 97.5°F | Resp 14 | Ht 72.0 in | Wt 211.4 lb

## 2019-10-24 DIAGNOSIS — Z87898 Personal history of other specified conditions: Secondary | ICD-10-CM

## 2019-10-24 DIAGNOSIS — E782 Mixed hyperlipidemia: Secondary | ICD-10-CM

## 2019-10-24 DIAGNOSIS — F17200 Nicotine dependence, unspecified, uncomplicated: Secondary | ICD-10-CM

## 2019-10-24 DIAGNOSIS — E1110 Type 2 diabetes mellitus with ketoacidosis without coma: Secondary | ICD-10-CM

## 2019-10-24 MED ORDER — METFORMIN HCL 500 MG PO TABS: 500.0000 mg | ORAL_TABLET | Freq: Two times a day (BID) | ORAL | 2 refills | Status: DC

## 2019-10-24 NOTE — Telephone Encounter (Signed)
Error

## 2019-10-24 NOTE — Patient Instructions (Signed)
Please follow-up with endocrine, referral placed again today

## 2019-10-25 LAB — BASIC METABOLIC PANEL WITH GFR
BUN: 11 mg/dL (ref 7–25)
CO2: 27 mmol/L (ref 20–32)
Calcium: 9.2 mg/dL (ref 8.6–10.3)
Chloride: 109 mmol/L (ref 98–110)
Creat: 0.95 mg/dL (ref 0.60–1.35)
GFR, Est African American: 126 mL/min/{1.73_m2} (ref >=60)
GFR, Est Non African American: 108 mL/min/{1.73_m2} (ref >=60)
Glucose, Bld: 123 mg/dL — ABNORMAL HIGH (ref 65–99)
Potassium: 4.1 mmol/L (ref 3.5–5.3)
Sodium: 142 mmol/L (ref 135–146)

## 2019-10-25 LAB — HEMOGLOBIN A1C
Hgb A1c MFr Bld: 8.4 % of total Hgb — ABNORMAL HIGH (ref <5.7)
Mean Plasma Glucose: 194 (calc)
eAG (mmol/L): 10.8 (calc)

## 2020-01-23 ENCOUNTER — Other Ambulatory Visit: Payer: Self-pay | Admitting: Internal Medicine

## 2020-01-23 MED ORDER — INSULIN PEN NEEDLE 32G X 4 MM MISC: 1.0000 "application " | Freq: Two times a day (BID) | 0 refills | Status: DC

## 2020-01-23 MED ORDER — NOVOLIN N FLEXPEN RELION 100 UNIT/ML ~~LOC~~ SUPN: 5.0000 [IU] | PEN_INJECTOR | Freq: Two times a day (BID) | SUBCUTANEOUS | 0 refills | Status: DC

## 2020-01-23 NOTE — Telephone Encounter (Signed)
Patient was supposed to follow-up with endocrine. Please inquire if this happened and if not, he needs to. Thanks

## 2020-01-23 NOTE — Telephone Encounter (Signed)
Refill request for 2 items that were prescribed in January at discharge after being hospitalized for DKA.  Sending back to PCP.

## 2020-01-23 NOTE — Telephone Encounter (Signed)
Medication Refill - Medication:  Insulin NPH, Human,, Isophane, (NOVOLIN N FLEXPEN RELION) 100 UNIT/ML Jerome Murphy [[id number]]  Insulin Pen Needle 32G X 4 MM MISC [[id number]]     Preferred Pharmacy (with phone number or street name):  [facility] [address], [place] - [address]  [address] [place] [address]  Phone: [phone] Fax: [phone]  Hours: Not open 24 hours    Agent: Please be advised that RX refills may take up to 3 business days. We ask that you follow-up with your pharmacy.

## 2020-01-25 NOTE — Telephone Encounter (Signed)
Called patient, no answer, he does not have a voicemail box set up.

## 2020-08-16 ENCOUNTER — Emergency Department
Admission: EM | Admit: 2020-08-16 | Discharge: 2020-08-16 | Disposition: A | Discharge status: Home / Self Care | Payer: Self-pay | Attending: Emergency Medicine | Admitting: Emergency Medicine

## 2020-08-16 ENCOUNTER — Other Ambulatory Visit: Payer: Self-pay

## 2020-08-16 ENCOUNTER — Emergency Department: Payer: Self-pay

## 2020-08-16 ENCOUNTER — Encounter: Payer: Self-pay | Admitting: Emergency Medicine

## 2020-08-16 DIAGNOSIS — R1012 Left upper quadrant pain: Secondary | ICD-10-CM

## 2020-08-16 DIAGNOSIS — J45909 Unspecified asthma, uncomplicated: Secondary | ICD-10-CM | POA: Insufficient documentation

## 2020-08-16 DIAGNOSIS — F17210 Nicotine dependence, cigarettes, uncomplicated: Secondary | ICD-10-CM | POA: Insufficient documentation

## 2020-08-16 DIAGNOSIS — E1110 Type 2 diabetes mellitus with ketoacidosis without coma: Secondary | ICD-10-CM | POA: Insufficient documentation

## 2020-08-16 DIAGNOSIS — Z794 Long term (current) use of insulin: Secondary | ICD-10-CM | POA: Insufficient documentation

## 2020-08-16 DIAGNOSIS — Z7984 Long term (current) use of oral hypoglycemic drugs: Secondary | ICD-10-CM | POA: Insufficient documentation

## 2020-08-16 DIAGNOSIS — K5900 Constipation, unspecified: Secondary | ICD-10-CM

## 2020-08-16 HISTORY — DX: Personal history of other diseases of the digestive system: Z87.19

## 2020-08-16 LAB — COMPREHENSIVE METABOLIC PANEL
ALT: 11 U/L (ref 0–44)
AST: 15 U/L (ref 15–41)
Albumin: 4 g/dL (ref 3.5–5.0)
Alkaline Phosphatase: 96 U/L (ref 38–126)
Anion gap: 8 (ref 5–15)
BUN: 11 mg/dL (ref 6–20)
CO2: 24 mmol/L (ref 22–32)
Calcium: 8.8 mg/dL — ABNORMAL LOW (ref 8.9–10.3)
Chloride: 98 mmol/L (ref 98–111)
Creatinine, Ser: 0.86 mg/dL (ref 0.61–1.24)
GFR, Estimated: >60 mL/min (ref >60)
Glucose, Bld: 538 mg/dL (ref 70–99)
Potassium: 4.2 mmol/L (ref 3.5–5.1)
Sodium: 130 mmol/L — ABNORMAL LOW (ref 135–145)
Total Bilirubin: 0.3 mg/dL (ref 0.3–1.2)
Total Protein: 7 g/dL (ref 6.5–8.1)

## 2020-08-16 LAB — URINALYSIS, COMPLETE (UACMP) WITH MICROSCOPIC
Bacteria, UA: NONE SEEN
Bilirubin Urine: NEGATIVE
Glucose, UA: >=500 mg/dL — AB
Hgb urine dipstick: NEGATIVE
Ketones, ur: NEGATIVE mg/dL
Leukocytes,Ua: NEGATIVE
Nitrite: NEGATIVE
Protein, ur: NEGATIVE mg/dL
Specific Gravity, Urine: 1.031 — ABNORMAL HIGH (ref 1.005–1.030)
Squamous Epithelial / HPF: NONE SEEN (ref 0–5)
WBC, UA: NONE SEEN WBC/hpf (ref 0–5)
pH: 5 (ref 5.0–8.0)

## 2020-08-16 LAB — CBC WITH DIFFERENTIAL/PLATELET
Abs Immature Granulocytes: 0.01 10*3/uL (ref 0.00–0.07)
Basophils Absolute: 0.1 10*3/uL (ref 0.0–0.1)
Basophils Relative: 1 %
Eosinophils Absolute: 0.5 10*3/uL (ref 0.0–0.5)
Eosinophils Relative: 8 %
HCT: 38.2 % — ABNORMAL LOW (ref 39.0–52.0)
Hemoglobin: 13.2 g/dL (ref 13.0–17.0)
Immature Granulocytes: 0 %
Lymphocytes Relative: 47 %
Lymphs Abs: 3.3 10*3/uL (ref 0.7–4.0)
MCH: 31.9 pg (ref 26.0–34.0)
MCHC: 34.6 g/dL (ref 30.0–36.0)
MCV: 92.3 fL (ref 80.0–100.0)
Monocytes Absolute: 0.4 10*3/uL (ref 0.1–1.0)
Monocytes Relative: 6 %
Neutro Abs: 2.6 10*3/uL (ref 1.7–7.7)
Neutrophils Relative %: 38 %
Platelets: 202 10*3/uL (ref 150–400)
RBC: 4.14 MIL/uL — ABNORMAL LOW (ref 4.22–5.81)
RDW: 11.7 % (ref 11.5–15.5)
WBC: 6.9 10*3/uL (ref 4.0–10.5)
nRBC: 0 % (ref 0.0–0.2)

## 2020-08-16 LAB — LIPASE, BLOOD: Lipase: 26 U/L (ref 11–51)

## 2020-08-16 LAB — CBG MONITORING, ED: Glucose-Capillary: 403 mg/dL — ABNORMAL HIGH (ref 70–99)

## 2020-08-16 LAB — TROPONIN I (HIGH SENSITIVITY): Troponin I (High Sensitivity): <2 ng/L (ref <18)

## 2020-08-16 MED ORDER — LACTATED RINGERS IV BOLUS
1000.0000 mL | Freq: Once | INTRAVENOUS | Status: AC
  Administered 2020-08-16: 1000 mL via INTRAVENOUS

## 2020-08-16 NOTE — ED Provider Notes (Signed)
[facility] Emergency Department Provider Note  ____________________________________________   Event Date/Time   First MD Initiated Contact with Patient 08/16/20 [phone]     (approximate)  I have reviewed the triage vital signs and the nursing notes.   HISTORY  Chief Complaint Abdominal Pain    HPI Jerome Murphy is a 30 y.o. male with medical history as listed below  who presents for evaluation of some pain in his left side that has been going on for couple days but was worse tonight.  He has a hard time describing exactly how it feels.  He said that it is up underneath the left side of his ribs but sometimes radiates down the left side of his lower abdomen.  Sometimes it radiates through to his back.  Sometimes it is a sharp pain sometimes an aching pain.  He occasionally feels like his stomach is "rolling around" but he denies nausea or vomiting.  He has not had any recent injury.  He has no pain when he urinates and no blood in his urine.  He has no known history of kidney stones.  He said he does not feel anything like when he was diagnosed with gallbladder disease in the past.  He has not had a recent sore throat or fever.  He denies chest pain or shortness of breath.  Nothing particular makes the pain and general discomfort any better or worse.  He states that he knows he has diabetes but does not check his blood sugar regularly.  He cannot remember the name of his doctor and he goes occasionally gets his medicines and he said he has been compliant with his medicines.        Past Medical History:  Diagnosis Date  . Asthma   . Diabetes mellitus without complication (HCC)   . History of gallstones   . Narcolepsy   . Seizures [facility])     Patient Active Problem List   Diagnosis Date Noted  . Mixed hyperlipidemia 10/24/2019  . Tobacco dependence 08/29/2019  . Type 2 diabetes mellitus with ketoacidosis without coma, without long-term current  use of insulin (HCC) 08/29/2019  . DKA (diabetic ketoacidoses) 08/05/2019  . Non compliance w medication regimen 08/05/2019    Past Surgical History:  Procedure Laterality Date  . CIRCUMCISION    . HERNIA REPAIR    . TONSILLECTOMY      Prior to Admission medications   Medication Sig Start Date End Date Taking? Authorizing Provider  atorvastatin (LIPITOR) 10 MG tablet Take 1 tablet (10 mg total) by mouth daily. 08/30/19   Jamelle Haring, MD  Insulin NPH, Human,, Isophane, (NOVOLIN N FLEXPEN RELION) 100 UNIT/ML Kiwkpen Inject 5 Units into the skin 2 (two) times daily. 01/23/20   Jamelle Haring, MD  Insulin Pen Needle 32G X 4 MM MISC 1 application by Does not apply route 2 (two) times daily. 01/23/20   Jamelle Haring, MD  metFORMIN (GLUCOPHAGE) 500 MG tablet Take 1 tablet (500 mg total) by mouth 2 (two) times daily with a meal. 10/24/19 10/23/20  Jamelle Haring, MD    Allergies Robitussin [guaifenesin]  History reviewed. No pertinent family history.  Social History Social History   Tobacco Use  . Smoking status: Current Every Day Smoker    Packs/day: 0.25    Types: Cigarettes  . Smokeless tobacco: Never Used  Vaping Use  . Vaping Use: Never used  Substance Use Topics  . Alcohol use: No  Comment: occasionallly  . Drug use: No    Review of Systems Constitutional: General malaise.  No fever/chills Eyes: No visual changes. ENT: No sore throat. Cardiovascular: Denies chest pain. Respiratory: Denies shortness of breath. Gastrointestinal: Some left-sided abdominal pain as described below in the musculoskeletal section..  No nausea, no vomiting.  No diarrhea.  No constipation. Genitourinary: Negative for dysuria.  Negative for hematuria. Musculoskeletal: Pain in the left side of his lower chest underneath his ribs that radiates down throughout his lower abdomen at various times and occasionally through to his back. Integumentary: Negative for  rash. Neurological: Negative for headaches, focal weakness or numbness.   ____________________________________________   PHYSICAL EXAM:  VITAL SIGNS: ED Triage Vitals  Enc Vitals Group     BP 08/16/20 0304 125/75     Pulse Rate 08/16/20 0304 69     Resp 08/16/20 0304 18     Temp 08/16/20 0304 97.9 F (36.6 C)     Temp Source 08/16/20 0304 Oral     SpO2 08/16/20 0304 98 %     Weight 08/16/20 0305 81.6 kg (180 lb)     Height 08/16/20 0305 1.829 m (6')     Head Circumference --      Peak Flow --      Pain Score 08/16/20 0305 10     Pain Loc --      Pain Edu? --      Excl. in GC? --     Constitutional: Alert and oriented.  Eyes: Conjunctivae are normal.  Head: Atraumatic. Nose: No congestion/rhinnorhea. Mouth/Throat: Patient is wearing a mask. Neck: No stridor.  No meningeal signs.   Cardiovascular: Normal rate, regular rhythm. Good peripheral circulation. Respiratory: Normal respiratory effort.  No retractions. Gastrointestinal: Soft and nontender.  No tenderness to palpation of the epigastrium or right upper quadrant with negative Murphy sign.  No right lower quadrant tenderness to palpation.  Some tenderness in the left lower quadrant and a slight bit more tenderness in the left upper quadrant. Musculoskeletal: No significant left CVA tenderness to percussion.  No lower extremity tenderness nor edema. No gross deformities of extremities. Neurologic:  Normal speech and language. No gross focal neurologic deficits are appreciated.  Skin:  Skin is warm, dry and intact. Psychiatric: Mood and affect are normal. Speech and behavior are normal.  ____________________________________________   LABS (all labs ordered are listed, but only abnormal results are displayed)  Labs Reviewed  CBC WITH DIFFERENTIAL/PLATELET - Abnormal; Notable for the following components:      Result Value   RBC 4.14 (*)    HCT 38.2 (*)    All other components within normal limits  COMPREHENSIVE  METABOLIC PANEL - Abnormal; Notable for the following components:   Sodium 130 (*)    Glucose, Bld 538 (*)    Calcium 8.8 (*)    All other components within normal limits  URINALYSIS, COMPLETE (UACMP) WITH MICROSCOPIC - Abnormal; Notable for the following components:   Color, Urine STRAW (*)    APPearance CLEAR (*)    Specific Gravity, Urine 1.031 (*)    Glucose, UA >=500 (*)    All other components within normal limits  CBG MONITORING, ED - Abnormal; Notable for the following components:   Glucose-Capillary 403 (*)    All other components within normal limits  LIPASE, BLOOD  TROPONIN I (HIGH SENSITIVITY)   ____________________________________________  EKG  ED ECG REPORT I, Loleta Rose, the attending physician, personally viewed and interpreted this ECG.  Date:  08/16/2020 EKG Time: 2:59 AM Rate: 80 Rhythm: normal sinus rhythm QRS Axis: normal Intervals: normal ST/T Wave abnormalities: normal Narrative Interpretation: no evidence of acute ischemia  ____________________________________________  RADIOLOGY I, Loleta Rose, personally viewed and evaluated these images (plain radiographs) as part of my medical decision making, as well as reviewing the written report by the radiologist.  ED MD interpretation: Question of constipation on CT scan, no other significant or abnormalities that correlate clinically.  Official radiology report(s): CT Renal Stone Study  Result Date: 08/16/2020 CLINICAL DATA:  Left flank pain for 5 days, denies history of urolithiasis EXAM: CT ABDOMEN AND PELVIS WITHOUT CONTRAST TECHNIQUE: Multidetector CT imaging of the abdomen and pelvis was performed following the standard protocol without IV contrast. COMPARISON:  CT and ultrasound 09/05/2019 FINDINGS: Lower chest: Lung bases are clear. Normal heart size. No pericardial effusion. Hepatobiliary: No visible focal liver lesion with limitations unenhanced CT. Gallbladder largely decompressed at the time of  exam. No pericholecystic fluid or inflammation. No visible calcified gallstones. No biliary ductal dilatation. Pancreas: No pancreatic ductal dilatation or surrounding inflammatory changes. Spleen: Normal in size. No concerning splenic lesions. Adrenals/Urinary Tract: Normal adrenal glands. Kidneys are symmetric in size and normally located. No visible or contour deforming renal lesions. No visible urolithiasis or hydronephrosis. Bladder is unremarkable for the degree of distention. Stomach/Bowel: Distal esophagus, stomach and duodenum are unremarkable with normal sweep across the midline abdomen. Question some increased vascularity and reactive adenopathy in the central mesentery albeit without frank small bowel thickening, dilatation or evidence of obstruction. Some fecalized distal small bowel contents may reflect slowed intestinal transit. Moderate colonic stool burden. No colonic dilatation or wall thickening. Normal appendix in the right lower quadrant. Vascular/Lymphatic: Some slightly increased mesenteric vascularity and reactive appearing mesenteric lymph nodes, increased in number from comparison studies. No pathologically enlarged lymph nodes. No acute or suspicious concerning vascular abnormality. Reproductive: The prostate and seminal vesicles are unremarkable. Other: No abdominopelvic free fluid or free gas. No bowel containing hernias. Musculoskeletal: No acute osseous abnormality or suspicious osseous lesion. IMPRESSION: 1. No visible urolithiasis or hydronephrosis. 2. Some slightly increased mesenteric vascularity and reactive appearing mesenteric lymph nodes, increased in number from comparison studies. Findings are nonspecific but could reflect an enteritis/enterocolitis versus mesenteric adenitis. 3. Some fecalized distal small bowel contents may reflect slowed intestinal transit. Moderate colonic stool burden. Correlate for features of constipation. Electronically Signed   By: Kreg Shropshire M.D.    On: 08/16/2020 05:50    ____________________________________________   PROCEDURES   Procedure(s) performed (including Critical Care):  Procedures   ____________________________________________   INITIAL IMPRESSION / MDM / ASSESSMENT AND PLAN / ED COURSE  As part of my medical decision making, I reviewed the following data within the electronic MEDICAL RECORD NUMBER Nursing notes reviewed and incorporated, Labs reviewed , EKG interpreted , Old chart reviewed and Notes from prior ED visits   Differential diagnosis includes, but is not limited to, musculoskeletal strain, kidney stones, biliary colic, hyperglycemia, splenomegaly or splenic injury.  Patient has no trauma history.  Vital signs are stable and within normal limits.  His physical exam is generally reassuring and he has only mild tenderness to palpation.  He has a difficult time describing his symptoms.  It is possible he may have a small kidney stone given the location and description of the symptoms which seem to wax and wane in severity and change in quality, possibly as the stone is buried throughout the ureter.  Of note he is  PERC negative and has a very low risk HEAR score for ACS.  His labs are notable for substantial hyperglycemia but without evidence of acidosis/DKA.  His anion gap is 8 and his electrolytes are within normal limits other than pseudohyponatremia.  His high-sensitivity troponin is negative and his lipase is within normal limits.  His urinalysis is unremarkable other than glucosuria.  He has a normal hemoglobin and no leukocytosis.  We discussed it and decided to proceed with a CT renal stone protocol to look for any gross abnormalities including ureterolithiasis and the possibility of splenomegaly given that the mild tenderness to palpation and it seems to be located mostly in the left upper quadrant.  I ordered 2 L of lactated Ringer's to help with a hyperglycemia but there is no indication he would benefit  from IV insulin tonight or additional treatment tonight.  I anticipate discharge and outpatient follow-up assuming that the CT scan is reassuring.     Clinical Course as of 08/16/20 0719  Fri Aug 16, 2020  8115 Patient sleeping comfortably.  I woke him up and he said he feels fine in general but when he moves around he can still feel a pulling or stretching pain in the left side of his abdomen that radiates up into his chest.  I have very low suspicion for both PE and ACS as described above.  This is most likely musculoskeletal but his CT scan was generally reassuring but showed signs of constipation.  The patient said that this could be possible.  It does not completely explain his pain but I recommended he take ibuprofen, Tylenol, and a stool softener and follow-up with his primary care doctor to discuss his symptoms as well as he has hyperglycemia.  There is no evidence of an emergent medical condition that requires additional evaluation or admission.  I gave my usual and customary return precautions and he understands and agrees with the plan. [CF]    Clinical Course User Index [CF] Loleta Rose, MD     ____________________________________________  FINAL CLINICAL IMPRESSION(S) / ED DIAGNOSES  Final diagnoses:  Left upper quadrant abdominal pain  Constipation, unspecified constipation type     MEDICATIONS GIVEN DURING THIS VISIT:  Medications  lactated ringers bolus 1,000 mL (0 mLs Intravenous Stopped 08/16/20 0607)  lactated ringers bolus 1,000 mL (0 mLs Intravenous Stopped 08/16/20 7262)     ED Discharge Orders    None      *Please note:  Jerome Murphy was evaluated in Emergency Department on 08/16/2020 for the symptoms described in the history of present illness. He was evaluated in the context of the global COVID-19 pandemic, which necessitated consideration that the patient might be at risk for infection with the SARS-CoV-2 virus that causes COVID-19. Institutional  protocols and algorithms that pertain to the evaluation of patients at risk for COVID-19 are in a state of rapid change based on information released by regulatory bodies including the CDC and federal and state organizations. These policies and algorithms were followed during the patient's care in the ED.  Some ED evaluations and interventions may be delayed as a result of limited staffing during and after the pandemic.*  Note:  This document was prepared using Dragon voice recognition software and may include unintentional dictation errors.   Loleta Rose, MD 08/16/20 [phone]

## 2020-08-16 NOTE — ED Notes (Signed)
Pt awake, alert and oriented x4 -- continues to report intermittent LUQ pain at times radiating to the chest region - sudden onset 5 days ago pta (08/11/20); states positional changes and deep breathing exacerbate pain - nothing noted to make pain better -- pt denies known sick contact, denies fever/chills; denies n/v/d -- last PO meal approx 2200hrs night pta -- pt with elevagted BGL reports he is compliant with Insulin at home.  Pt awaits ED provider eval - denies any needs, questions, concerns - will monitor for acute changes and maintain plan of care

## 2020-08-16 NOTE — ED Notes (Signed)
Pt resting with eyes closed; RR even and unlabored on RA - pt easily aroused with verbal stimulation.  Pt a&ox4 is agreeable with d/c plan as discussed by Dr York Cerise - this nurse has verbally reinforced d/c instructions and provided pt with written copy - pt acknowledges verbal understanding and denies any additional questions concerns needs.

## 2020-08-16 NOTE — ED Triage Notes (Signed)
Patient ambulatory to triage with steady gait, without difficulty or distress noted; pt reports left upper abd pain radiating into back since Sunday; st hx of same with cholecystitis

## 2020-08-16 NOTE — ED Notes (Signed)
Patient transported to CT (late entry)  

## 2020-08-16 NOTE — ED Notes (Signed)
Dr Forbach now at bedside   

## 2020-08-16 NOTE — Discharge Instructions (Signed)
Your workup in the Emergency Department today was reassuring.  We did not find any specific abnormalities.  We recommend you drink plenty of fluids, take your regular medications and/or any new ones prescribed today, and follow up with the doctor(s) listed in these documents as recommended.  We also recommend you consider taking ibuprofen and Tylenol according to label instructions to help with the discomfort.  You should consider taking an over-the-counter laxative or stool softener such as MiraLAX or Colace given that you had signs of constipation on your CT scan.  Finally, we strongly encourage you to follow-up with your regular doctor to discuss your diabetes; your blood glucose was quite elevated tonight and you may need an adjustment to your insulin regimen.  Return to the Emergency Department if you develop new or worsening symptoms that concern you.

## 2020-08-19 ENCOUNTER — Telehealth: Payer: Self-pay | Admitting: *Deleted

## 2020-08-19 NOTE — Telephone Encounter (Signed)
Transition Care Management Unsuccessful Follow-up Telephone Call  Date of discharge and from where:  08/16/2020 [facility] ED  Attempts:  1st Attempt  Reason for unsuccessful TCM follow-up call:  Unable to reach patient

## 2020-08-20 NOTE — Telephone Encounter (Signed)
Transition Care Management Unsuccessful Follow-up Telephone Call  Date of discharge and from where:  08/16/2020 [facility] ED  Attempts:  2nd Attempt  Reason for unsuccessful TCM follow-up call:  Unable to reach patient

## 2020-08-21 NOTE — Telephone Encounter (Signed)
Transition Care Management Unsuccessful Follow-up Telephone Call  Date of discharge and from where:  08/16/2020 [facility] ED  Attempts:  3rd Attempt  Reason for unsuccessful TCM follow-up call:  Unable to reach patient - received message that my call could be completed at this time.

## 2020-08-28 ENCOUNTER — Other Ambulatory Visit: Payer: Self-pay | Admitting: Pharmacist

## 2020-08-28 ENCOUNTER — Ambulatory Visit: Payer: Self-pay | Admitting: Physician Assistant

## 2020-08-28 ENCOUNTER — Other Ambulatory Visit: Payer: Self-pay | Admitting: Physician Assistant

## 2020-08-28 VITALS — BP 119/76 | HR 76 | Temp 98.7°F | Resp 18 | Ht 72.0 in | Wt 187.0 lb

## 2020-08-28 DIAGNOSIS — R5383 Other fatigue: Secondary | ICD-10-CM

## 2020-08-28 DIAGNOSIS — E1165 Type 2 diabetes mellitus with hyperglycemia: Secondary | ICD-10-CM

## 2020-08-28 DIAGNOSIS — E782 Mixed hyperlipidemia: Secondary | ICD-10-CM

## 2020-08-28 DIAGNOSIS — Z794 Long term (current) use of insulin: Secondary | ICD-10-CM

## 2020-08-28 DIAGNOSIS — E559 Vitamin D deficiency, unspecified: Secondary | ICD-10-CM

## 2020-08-28 DIAGNOSIS — R634 Abnormal weight loss: Secondary | ICD-10-CM

## 2020-08-28 LAB — POCT GLYCOSYLATED HEMOGLOBIN (HGB A1C): Hemoglobin A1C: 11.7 % — AB (ref 4.0–5.6)

## 2020-08-28 MED ORDER — HUMULIN N KWIKPEN 100 UNIT/ML ~~LOC~~ SUPN
10.0000 [IU] | PEN_INJECTOR | Freq: Two times a day (BID) | SUBCUTANEOUS | 1 refills | Status: DC
Start: 2020-08-28 — End: 2020-11-25

## 2020-08-28 MED ORDER — NOVOLIN N FLEXPEN RELION 100 UNIT/ML ~~LOC~~ SUPN: 10.0000 [IU] | PEN_INJECTOR | Freq: Two times a day (BID) | SUBCUTANEOUS | 5 refills | Status: DC

## 2020-08-28 MED ORDER — METFORMIN HCL 500 MG PO TABS: 1000.0000 mg | ORAL_TABLET | Freq: Two times a day (BID) | ORAL | 2 refills | Status: DC

## 2020-08-28 MED ORDER — HUMULIN N KWIKPEN 100 UNIT/ML ~~LOC~~ SUPN: 10.0000 [IU] | PEN_INJECTOR | Freq: Two times a day (BID) | SUBCUTANEOUS | 1 refills | Status: DC

## 2020-08-28 MED ORDER — INSULIN PEN NEEDLE 32G X 4 MM MISC: 1.0000 "application " | Freq: Two times a day (BID) | 11 refills | Status: DC

## 2020-08-28 MED ORDER — ATORVASTATIN CALCIUM 10 MG PO TABS: 10.0000 mg | ORAL_TABLET | Freq: Every day | ORAL | 3 refills | Status: DC

## 2020-08-28 MED FILL — ATORVASTATIN 10 MG TABLET: 10 | 30 days supply | Qty: 30 | Fill #0

## 2020-08-28 MED FILL — METFORMIN HCL 500 MG TABS: 500 | 30 days supply | Qty: 120 | Fill #0

## 2020-08-28 MED FILL — TRUEPLUS PEN NDL 32GX5/32: 32G X 4 MM | 50 days supply | Qty: 100 | Fill #0

## 2020-08-28 MED FILL — HUMULIN N 100 UNITS/ML KWIK: 100 | 30 days supply | Qty: 6 | Fill #0

## 2020-08-28 NOTE — Progress Notes (Signed)
Patient has not eaten nor taken medication today. Patient reports being out of DM medications for the past week. Patient request refills on today. Patient denies pain at this time.

## 2020-08-28 NOTE — Progress Notes (Signed)
New Patient Office Visit  Subjective:  Patient ID: Jerome Murphy, male    DOB: August 26, 1990  Age: 30 y.o. MRN: [id number]  CC:  Chief Complaint  Patient presents with   Diabetes    Medication Refill/ Management    HPI Jerome HeritageCameron Pierre Murphy reports that he was seen in the department August 16, 2020 for abdominal pain.  Note from hospital   INITIAL IMPRESSION / MDM / ASSESSMENT AND PLAN / ED COURSE  As part of my medical decision making, I reviewed the following data within the electronic MEDICAL RECORD NUMBER Nursing notes reviewed and incorporated, Labs reviewed , EKG interpreted , Old chart reviewed and Notes from prior ED visits   Differential diagnosis includes, but is not limited to, musculoskeletal strain, kidney stones, biliary colic, hyperglycemia, splenomegaly or splenic injury.  Patient has no trauma history.  Vital signs are stable and within normal limits.  His physical exam is generally reassuring and he has only mild tenderness to palpation.  He has a difficult time describing his symptoms.  It is possible he may have a small kidney stone given the location and description of the symptoms which seem to wax and wane in severity and change in quality, possibly as the stone is buried throughout the ureter.  Of note he is PERC negative and has a very low risk HEAR score for ACS.  His labs are notable for substantial hyperglycemia but without evidence of acidosis/DKA.  His anion gap is 8 and his electrolytes are within normal limits other than pseudohyponatremia.  His high-sensitivity troponin is negative and his lipase is within normal limits.  His urinalysis is unremarkable other than glucosuria.  He has a normal hemoglobin and no leukocytosis.  We discussed it and decided to proceed with a CT renal stone protocol to look for any gross abnormalities including ureterolithiasis and the possibility of splenomegaly given that the mild tenderness to palpation and  it seems to be located mostly in the left upper quadrant.  I ordered 2 L of lactated Ringer's to help with a hyperglycemia but there is no indication he would benefit from IV insulin tonight or additional treatment tonight.  I anticipate discharge and outpatient follow-up assuming that the CT scan is reassuring.        Clinical Course as of 08/16/20 0719  Fri Aug 16, 2020  [id number] Patient sleeping comfortably.  I woke him up and he said he feels fine in general but when he moves around he can still feel a pulling or stretching pain in the left side of his abdomen that radiates up into his chest.  I have very low suspicion for both PE and ACS as described above.  This is most likely musculoskeletal but his CT scan was generally reassuring but showed signs of constipation.  The patient said that this could be possible.  It does not completely explain his pain but I recommended he take ibuprofen, Tylenol, and a stool softener and follow-up with his primary care doctor to discuss his symptoms as well as he has hyperglycemia.  There is no evidence of an emergent medical condition that requires additional evaluation or admission.  I gave my usual and customary return precautions and he understands and agrees with the plan. [CF]    Reports today that he has difficulty affording his meds,  has been without his medication.  Reports that he was taking 10 units of insulin and 500 mg of Metformin in the morning and 500 mg of  Metformin at 4 PM, 10 units of insulin at 9 PM.  Reports that he has had very low energy, would like to be more compliant to his medications.  Reports in the last year and a half he has lost approximately 80 to 90 pounds without effort   Past Medical History:  Diagnosis Date   Asthma    Diabetes mellitus without complication (HCC)    History of gallstones    Narcolepsy    Seizures (HCC)     Past Surgical History:  Procedure Laterality Date   CIRCUMCISION     HERNIA REPAIR      TONSILLECTOMY      History reviewed. No pertinent family history.  Social History   Socioeconomic History   Marital status: Single    Spouse name: Not on file   Number of children: 2   Years of education: Not on file   Highest education level: Associate degree: occupational, Scientist, product/process development, or vocational program  Occupational History   Occupation: Barbershop  Tobacco Use   Smoking status: Current Every Day Smoker    Packs/day: 0.25    Types: Cigarettes   Smokeless tobacco: Never Used  Vaping Use   Vaping Use: Never used  Substance and Sexual Activity   Alcohol use: No    Comment: occasionallly   Drug use: No   Sexual activity: Yes    Birth control/protection: Condom    Comment: Partner uses Depo  Other Topics Concern   Not on file  Social History Narrative   Not on file   Social Determinants of Health   Financial Resource Strain: Not on file  Food Insecurity: Not on file  Transportation Needs: Not on file  Physical Activity: Not on file  Stress: Not on file  Social Connections: Not on file  Intimate Partner Violence: Not on file    ROS Review of Systems  Constitutional: Positive for fatigue and unexpected weight change.  HENT: Negative.   Eyes: Negative.   Respiratory: Negative.   Cardiovascular: Negative.   Gastrointestinal: Negative.   Endocrine: Negative.   Genitourinary: Negative.   Musculoskeletal: Negative.   Skin: Negative.   Allergic/Immunologic: Negative.   Neurological: Negative.   Hematological: Negative.   Psychiatric/Behavioral: Negative.     Objective:   Today's Vitals: BP 119/76 (BP Location: Left Arm, Patient Position: Sitting, Cuff Size: Normal)    Pulse 76    Temp 98.7 F (37.1 C) (Oral)    Resp 18    Ht 6' (1.829 m)    Wt 187 lb (84.8 kg)    SpO2 97%    BMI 25.36 kg/m   Physical Exam Vitals and nursing note reviewed.  Constitutional:      General: He is not in acute distress.    Appearance: Normal appearance. He  is not ill-appearing.  HENT:     Head: Normocephalic and atraumatic.     Right Ear: External ear normal.     Left Ear: External ear normal.     Nose: Nose normal.     Mouth/Throat:     Mouth: Mucous membranes are moist.     Pharynx: Oropharynx is clear.  Eyes:     Extraocular Movements: Extraocular movements intact.     Conjunctiva/sclera: Conjunctivae normal.     Pupils: Pupils are equal, round, and reactive to light.  Cardiovascular:     Rate and Rhythm: Normal rate and regular rhythm.     Pulses: Normal pulses.     Heart sounds: Normal heart sounds.  Pulmonary:     Effort: Pulmonary effort is normal.     Breath sounds: Normal breath sounds.  Musculoskeletal:        General: Normal range of motion.     Cervical back: Normal range of motion and neck supple.  Skin:    General: Skin is warm and dry.  Neurological:     General: No focal deficit present.     Mental Status: He is alert and oriented to person, place, and time.  Psychiatric:        Mood and Affect: Mood normal.        Behavior: Behavior normal.        Thought Content: Thought content normal.        Judgment: Judgment normal.     Assessment & Plan:   Problem List Items Addressed This Visit      Endocrine   Type 2 diabetes mellitus with ketoacidosis without coma, without long-term current use of insulin (HCC) - Primary   Relevant Medications   atorvastatin (LIPITOR) 10 MG tablet   Insulin NPH, Human,, Isophane, (HUMULIN N KWIKPEN) 100 UNIT/ML Kiwkpen   Insulin Pen Needle 32G X 4 MM MISC   metFORMIN (GLUCOPHAGE) 500 MG tablet     Other   Mixed hyperlipidemia   Relevant Medications   atorvastatin (LIPITOR) 10 MG tablet   Other Relevant Orders   Lipid panel   Unexplained weight loss   Relevant Orders   TSH   Other fatigue      Outpatient Encounter Medications as of 08/28/2020  Medication Sig   atorvastatin (LIPITOR) 10 MG tablet Take 1 tablet (10 mg total) by mouth daily.   Insulin NPH, Human,,  Isophane, (HUMULIN N KWIKPEN) 100 UNIT/ML Kiwkpen Inject 10 Units into the skin 2 (two) times daily.   Insulin Pen Needle 32G X 4 MM MISC 1 application by Does not apply route 2 (two) times daily.   metFORMIN (GLUCOPHAGE) 500 MG tablet Take 2 tablets (1,000 mg total) by mouth 2 (two) times daily with a meal.   [DISCONTINUED] atorvastatin (LIPITOR) 10 MG tablet Take 1 tablet (10 mg total) by mouth daily.   [DISCONTINUED] atorvastatin (LIPITOR) 10 MG tablet Take 1 tablet (10 mg total) by mouth daily.   [DISCONTINUED] Insulin NPH, Human,, Isophane, (NOVOLIN N FLEXPEN RELION) 100 UNIT/ML Kiwkpen Inject 5 Units into the skin 2 (two) times daily.   [DISCONTINUED] Insulin NPH, Human,, Isophane, (NOVOLIN N FLEXPEN RELION) 100 UNIT/ML Kiwkpen Inject 10 Units into the skin 2 (two) times daily.   [DISCONTINUED] Insulin Pen Needle 32G X 4 MM MISC 1 application by Does not apply route 2 (two) times daily.   [DISCONTINUED] Insulin Pen Needle 32G X 4 MM MISC 1 application by Does not apply route 2 (two) times daily.   [DISCONTINUED] metFORMIN (GLUCOPHAGE) 500 MG tablet Take 1 tablet (500 mg total) by mouth 2 (two) times daily with a meal.   [DISCONTINUED] metFORMIN (GLUCOPHAGE) 500 MG tablet Take 2 tablets (1,000 mg total) by mouth 2 (two) times daily with a meal.   No facility-administered encounter medications on file as of 08/28/2020.  1. Type 2 diabetes mellitus with hyperglycemia, with long-term current use of insulin (HCC) Patient encouraged to increase Metformin 1000 mg twice a day, continue 10 units of insulin daily.  Encouraged to check blood glucose level several times a day, keep a written log and have it available  Patient given appointment to establish care at [facility] on October 01, 2020. -  HgB A1c - Comp. Metabolic Panel (12) - Vitamin D, 25-hydroxy - Insulin NPH, Human,, Isophane, (HUMULIN N KWIKPEN) 100 UNIT/ML Kiwkpen; Inject 10 Units into the skin 2 (two) times daily.   Dispense: 15 mL; Refill: 1 - Insulin Pen Needle 32G X 4 MM MISC; 1 application by Does not apply route 2 (two) times daily.  Dispense: 60 each; Refill: 11 - metFORMIN (GLUCOPHAGE) 500 MG tablet; Take 2 tablets (1,000 mg total) by mouth 2 (two) times daily with a meal.  Dispense: 120 tablet; Refill: 2  2. Mixed hyperlipidemia  - Lipid panel - atorvastatin (LIPITOR) 10 MG tablet; Take 1 tablet (10 mg total) by mouth daily.  Dispense: 90 tablet; Refill: 3  3. Unexplained weight loss Recorded weight of 280 04/2019;  today's weight 187 - TSH  4. Other fatigue    I have reviewed the patient's medical history (PMH, PSH, Social History, Family History, Medications, and allergies) , and have been updated if relevant. I spent 30 minutes reviewing chart and  face to face time with patient.      Follow-up: Return in about 5 weeks (around 10/01/2020) for To establish PCP at [facility].   Kasandra Knudsen Mayers, PA-C

## 2020-08-28 NOTE — Patient Instructions (Signed)
You will take 10 units of insulin twice a day and increased dose of Metformin of 1000 mg twice a day.  I encourage you to check your blood glucose levels several times a day, keep a written log and have that available for next office visit.  We will call you with your lab results.  Please let us know if there is anything else we can do for you  Roney Jaffe, PA-C Physician Assistant [facility] [email or web address]   Health Maintenance, Male Adopting a healthy lifestyle and getting preventive care are important in promoting health and wellness. Ask your health care provider about:  The right schedule for you to have regular tests and exams.  Things you can do on your own to prevent diseases and keep yourself healthy. What should I know about diet, weight, and exercise? Eat a healthy diet  Eat a diet that includes plenty of vegetables, fruits, low-fat dairy products, and lean protein.  Do not eat a lot of foods that are high in solid fats, added sugars, or sodium.   Maintain a healthy weight Body mass index (BMI) is a measurement that can be used to identify possible weight problems. It estimates body fat based on height and weight. Your health care provider can help determine your BMI and help you achieve or maintain a healthy weight. Get regular exercise Get regular exercise. This is one of the most important things you can do for your health. Most adults should:  Exercise for at least 150 minutes each week. The exercise should increase your heart rate and make you sweat (moderate-intensity exercise).  Do strengthening exercises at least twice a week. This is in addition to the moderate-intensity exercise.  Spend less time sitting. Even light physical activity can be beneficial. Watch cholesterol and blood lipids Have your blood tested for lipids and cholesterol at 29 years of age, then have this test every 5 years. You  may need to have your cholesterol levels checked more often if:  Your lipid or cholesterol levels are high.  You are older than 29 years of age.  You are at high risk for heart disease. What should I know about cancer screening? Many types of cancers can be detected early and may often be prevented. Depending on your health history and family history, you may need to have cancer screening at various ages. This may include screening for:  Colorectal cancer.  Prostate cancer.  Skin cancer.  Lung cancer. What should I know about heart disease, diabetes, and high blood pressure? Blood pressure and heart disease  High blood pressure causes heart disease and increases the risk of stroke. This is more likely to develop in people who have high blood pressure readings, are of African descent, or are overweight.  Talk with your health care provider about your target blood pressure readings.  Have your blood pressure checked: ? Every 3-5 years if you are 48-68 years of age. ? Every year if you are 10 years old or older.  If you are between the ages of 6 and 58 and are a current or former smoker, ask your health care provider if you should have a one-time screening for abdominal aortic aneurysm (AAA). Diabetes Have regular diabetes screenings. This checks your fasting blood sugar level. Have the screening done:  Once every three years after age 57 if you are at a normal weight and have a low risk for diabetes.  More often and at a younger age if you  are overweight or have a high risk for diabetes. What should I know about preventing infection? Hepatitis B If you have a higher risk for hepatitis B, you should be screened for this virus. Talk with your health care provider to find out if you are at risk for hepatitis B infection. Hepatitis C Blood testing is recommended for:  Everyone born from [birth year] through 1964.  Anyone with known risk factors for hepatitis C. Sexually transmitted  infections (STIs)  You should be screened each year for STIs, including gonorrhea and chlamydia, if: ? You are sexually active and are younger than 29 years of age. ? You are older than 29 years of age and your health care provider tells you that you are at risk for this type of infection. ? Your sexual activity has changed since you were last screened, and you are at increased risk for chlamydia or gonorrhea. Ask your health care provider if you are at risk.  Ask your health care provider about whether you are at high risk for HIV. Your health care provider may recommend a prescription medicine to help prevent HIV infection. If you choose to take medicine to prevent HIV, you should first get tested for HIV. You should then be tested every 3 months for as long as you are taking the medicine. Follow these instructions at home: Lifestyle  Do not use any products that contain nicotine or tobacco, such as cigarettes, e-cigarettes, and chewing tobacco. If you need help quitting, ask your health care provider.  Do not use street drugs.  Do not share needles.  Ask your health care provider for help if you need support or information about quitting drugs. Alcohol use  Do not drink alcohol if your health care provider tells you not to drink.  If you drink alcohol: ? Limit how much you have to 0-2 drinks a day. ? Be aware of how much alcohol is in your drink. In the [place] one drink equals one 12 oz bottle of beer (355 mL), one 5 oz glass of wine (148 mL), or one 1 oz glass of hard liquor (44 mL). General instructions  Schedule regular health, dental, and eye exams.  Stay current with your vaccines.  Tell your health care provider if: ? You often feel depressed. ? You have ever been abused or do not feel safe at home. Summary  Adopting a healthy lifestyle and getting preventive care are important in promoting health and wellness.  Follow your health care provider's instructions about  healthy diet, exercising, and getting tested or screened for diseases.  Follow your health care provider's instructions on monitoring your cholesterol and blood pressure. This information is not intended to replace advice given to you by your health care provider. Make sure you discuss any questions you have with your health care provider. Document Revised: 06/22/2018 Document Reviewed: 06/22/2018 Elsevier Patient Education  2021 [employer].

## 2020-08-29 DIAGNOSIS — R5383 Other fatigue: Secondary | ICD-10-CM | POA: Insufficient documentation

## 2020-08-29 DIAGNOSIS — R634 Abnormal weight loss: Secondary | ICD-10-CM | POA: Insufficient documentation

## 2020-08-30 LAB — COMP. METABOLIC PANEL (12)
AST: 9 IU/L (ref 0–40)
Albumin/Globulin Ratio: 1.8 (ref 1.2–2.2)
Albumin: 4.4 g/dL (ref 4.1–5.2)
Alkaline Phosphatase: 86 IU/L (ref 44–121)
BUN/Creatinine Ratio: 13 (ref 9–20)
BUN: 11 mg/dL (ref 6–20)
Bilirubin Total: 0.4 mg/dL (ref 0.0–1.2)
Calcium: 9.1 mg/dL (ref 8.7–10.2)
Chloride: 99 mmol/L (ref 96–106)
Creatinine, Ser: 0.86 mg/dL (ref 0.76–1.27)
GFR calc Af Amer: 136 mL/min/{1.73_m2} (ref >59)
GFR calc non Af Amer: 118 mL/min/{1.73_m2} (ref >59)
Globulin, Total: 2.5 g/dL (ref 1.5–4.5)
Glucose: 275 mg/dL — ABNORMAL HIGH (ref 65–99)
Potassium: 4.4 mmol/L (ref 3.5–5.2)
Sodium: 136 mmol/L (ref 134–144)
Total Protein: 6.9 g/dL (ref 6.0–8.5)

## 2020-08-30 LAB — VITAMIN D 25 HYDROXY (VIT D DEFICIENCY, FRACTURES): Vit D, 25-Hydroxy: 8.3 ng/mL — ABNORMAL LOW (ref 30.0–100.0)

## 2020-08-30 LAB — LIPID PANEL
Chol/HDL Ratio: 3.6 ratio (ref 0.0–5.0)
Cholesterol, Total: 132 mg/dL (ref 100–199)
HDL: 37 mg/dL — ABNORMAL LOW (ref >39)
LDL Chol Calc (NIH): 82 mg/dL (ref 0–99)
Triglycerides: 63 mg/dL (ref 0–149)
VLDL Cholesterol Cal: 13 mg/dL (ref 5–40)

## 2020-08-30 LAB — TSH: TSH: 1.13 u[IU]/mL (ref 0.450–4.500)

## 2020-08-31 ENCOUNTER — Other Ambulatory Visit: Payer: Self-pay | Admitting: Physician Assistant

## 2020-08-31 MED ORDER — VITAMIN D (ERGOCALCIFEROL) 1.25 MG (50000 UNIT) PO CAPS: 50000.0000 [IU] | ORAL_CAPSULE | ORAL | 2 refills | Status: DC

## 2020-08-31 NOTE — Addendum Note (Signed)
Addended by: Roney Jaffe on: 08/31/2020 11:51 AM   Modules accepted: Orders

## 2020-09-02 ENCOUNTER — Telehealth: Payer: Self-pay | Admitting: *Deleted

## 2020-09-02 NOTE — Telephone Encounter (Signed)
Medical Assistant left message on patient's home and cell voicemail. Voicemail states to give a call back to [place] with [facility] at [phone]. Patient is aware of labs being normal except for vitamin d level and needing to take a weekly supplement and have a recheck completed in 1 month.

## 2020-09-02 NOTE — Telephone Encounter (Signed)
-----   Message from Roney Jaffe, [place] sent at 08/31/2020 11:51 AM EST ----- Please call patient and let him know that his thyroid function, kidney and liver function are within normal limits.  His vitamin D is very low, he needs to take 50,000 units once a week for at least the next 12 weeks.  He should have it retested at that time.  Prescription sent to his pharmacy.

## 2020-09-03 ENCOUNTER — Ambulatory Visit: Payer: Medicaid Other | Admitting: Gerontology

## 2020-09-05 MED FILL — HUMULIN N 100 UNITS/ML KWIK: 100 | 30 days supply | Qty: 6 | Fill #0

## 2020-09-05 MED FILL — ATORVASTATIN 10 MG TABLET: 10 | 30 days supply | Qty: 30 | Fill #0

## 2020-09-10 MED FILL — VIT D2 1.25 MG (50,000 UNIT: 1.25 MG | 28 days supply | Qty: 4 | Fill #0

## 2020-10-01 ENCOUNTER — Encounter: Payer: Self-pay | Admitting: Family

## 2020-10-01 NOTE — Progress Notes (Signed)
Patient did not show for appointment.   

## 2020-11-08 ENCOUNTER — Other Ambulatory Visit: Payer: Self-pay

## 2020-11-08 MED FILL — Insulin NPH (Human) (Isophane) Susp Pen-injector 100 Unit/ML: SUBCUTANEOUS | 30 days supply | Qty: 6 | Fill #0 | Status: AC

## 2020-11-08 MED FILL — Ergocalciferol Cap 1.25 MG (50000 Unit): ORAL | 28 days supply | Qty: 4 | Fill #0 | Status: CN

## 2020-11-08 MED FILL — Metformin HCl Tab 500 MG: ORAL | 30 days supply | Qty: 120 | Fill #0 | Status: CN

## 2020-11-15 ENCOUNTER — Other Ambulatory Visit: Payer: Self-pay

## 2020-11-25 ENCOUNTER — Ambulatory Visit: Payer: Self-pay | Admitting: Physician Assistant

## 2020-11-25 ENCOUNTER — Encounter: Payer: Self-pay | Admitting: Emergency Medicine

## 2020-11-25 ENCOUNTER — Emergency Department
Admission: EM | Admit: 2020-11-25 | Discharge: 2020-11-25 | Disposition: A | Discharge status: Home / Self Care | Payer: Medicaid Other | Attending: Emergency Medicine | Admitting: Emergency Medicine

## 2020-11-25 ENCOUNTER — Other Ambulatory Visit: Payer: Self-pay

## 2020-11-25 VITALS — BP 116/76 | HR 80 | Temp 98.2°F | Resp 18 | Ht 71.0 in | Wt 190.0 lb

## 2020-11-25 DIAGNOSIS — E559 Vitamin D deficiency, unspecified: Secondary | ICD-10-CM

## 2020-11-25 DIAGNOSIS — R202 Paresthesia of skin: Secondary | ICD-10-CM | POA: Insufficient documentation

## 2020-11-25 DIAGNOSIS — Z5321 Procedure and treatment not carried out due to patient leaving prior to being seen by health care provider: Secondary | ICD-10-CM | POA: Insufficient documentation

## 2020-11-25 DIAGNOSIS — M792 Neuralgia and neuritis, unspecified: Secondary | ICD-10-CM

## 2020-11-25 DIAGNOSIS — Z794 Long term (current) use of insulin: Secondary | ICD-10-CM

## 2020-11-25 DIAGNOSIS — E782 Mixed hyperlipidemia: Secondary | ICD-10-CM

## 2020-11-25 DIAGNOSIS — E1165 Type 2 diabetes mellitus with hyperglycemia: Secondary | ICD-10-CM

## 2020-11-25 LAB — GLUCOSE, POCT (MANUAL RESULT ENTRY): POC Glucose: 266 mg/dl — AB (ref 70–99)

## 2020-11-25 LAB — POCT GLYCOSYLATED HEMOGLOBIN (HGB A1C): Hemoglobin A1C: 8.2 % — AB (ref 4.0–5.6)

## 2020-11-25 MED ORDER — GABAPENTIN 100 MG PO CAPS: 100.0000 mg | ORAL_CAPSULE | Freq: Three times a day (TID) | ORAL | 0 refills | Status: DC

## 2020-11-25 MED ORDER — GABAPENTIN 100 MG PO CAPS
100.0000 mg | ORAL_CAPSULE | Freq: Three times a day (TID) | ORAL | 0 refills | Status: DC
  Filled 2020-11-25: qty 90, 30d supply, fill #0

## 2020-11-25 NOTE — Patient Instructions (Addendum)
For your neuropathy, you are going to take gabapentin 100 mg 3 times a day.  I recommend over the next couple of days that you start increasing the dose to up to 3 times a day.  For example, today take 1 dose at bedtime, then tomorrow take it twice a day, the following day take it 3 times a day.  Make sure that you follow-up with your new primary care provider tomorrow.  We will call you with your lab result.  Please let us know if there is anything else we can do for you  Roney Jaffe, PA-C Physician Assistant [facility] [email or web address]   Neuropathic Pain Neuropathic pain is pain caused by damage to the nerves that are responsible for certain sensations in your body (sensory nerves). The pain can be caused by:  Damage to the sensory nerves that send signals to your spinal cord and brain (peripheral nervous system).  Damage to the sensory nerves in your brain or spinal cord (central nervous system). Neuropathic pain can make you more sensitive to pain. Even a minor sensation can feel very painful. This is usually a long-term condition that can be difficult to treat. The type of pain differs from person to person. It may:  Start suddenly (acute), or it may develop slowly and last for a long time (chronic).  Come and go as damaged nerves heal, or it may stay at the same level for years.  Cause emotional distress, loss of sleep, and a lower quality of life. What are the causes? The most common cause of this condition is diabetes. Many other diseases and conditions can also cause neuropathic pain. Causes of neuropathic pain can be classified as:  Toxic. This is caused by medicines and chemicals. The most common cause of toxic neuropathic pain is damage from cancer treatments (chemotherapy).  Metabolic. This can be caused by: ? Diabetes. This is the most common disease that damages the nerves. ? Lack of vitamin B from  long-term alcohol abuse.  Traumatic. Any injury that cuts, crushes, or stretches a nerve can cause damage and pain. A common example is feeling pain after losing an arm or leg (phantom limb pain).  Compression-related. If a sensory nerve gets trapped or compressed for a long period of time, the blood supply to the nerve can be cut off.  Vascular. Many blood vessel diseases can cause neuropathic pain by decreasing blood supply and oxygen to nerves.  Autoimmune. This type of pain results from diseases in which the body's defense system (immune system) mistakenly attacks sensory nerves. Examples of autoimmune diseases that can cause neuropathic pain include lupus and multiple sclerosis.  Infectious. Many types of viral infections can damage sensory nerves and cause pain. Shingles infection is a common cause of this type of pain.  Inherited. Neuropathic pain can be a symptom of many diseases that are passed down through families (genetic). What increases the risk? You are more likely to develop this condition if:  You have diabetes.  You smoke.  You drink too much alcohol.  You are taking certain medicines, including medicines that kill cancer cells (chemotherapy) or that treat immune system disorders. What are the signs or symptoms? The main symptom is pain. Neuropathic pain is often described as:  Burning.  Shock-like.  Stinging.  Hot or cold.  Itching. How is this diagnosed? No single test can diagnose neuropathic pain. It is diagnosed based on:  Physical exam and your symptoms. Your health care provider will ask  you about your pain. You may be asked to use a pain scale to describe how bad your pain is.  Tests. These may be done to see if you have a high sensitivity to pain and to help find the cause and location of any sensory nerve damage. They include: ? Nerve conduction studies to test how well nerve signals travel through your sensory nerves (electrodiagnostic  testing). ? Stimulating your sensory nerves through electrodes on your skin and measuring the response in your spinal cord and brain (somatosensory evoked potential).  Imaging studies, such as: ? X-rays. ? CT scan. ? MRI. How is this treated? Treatment for neuropathic pain may change over time. You may need to try different treatment options or a combination of treatments. Some options include:  Treating the underlying cause of the neuropathy, such as diabetes, kidney disease, or vitamin deficiencies.  Stopping medicines that can cause neuropathy, such as chemotherapy.  Medicine to relieve pain. Medicines may include: ? Prescription or over-the-counter pain medicine. ? Anti-seizure medicine. ? Antidepressant medicines. ? Pain-relieving patches that are applied to painful areas of skin. ? A medicine to numb the area (local anesthetic), which can be injected as a nerve block.  Transcutaneous nerve stimulation. This uses electrical currents to block painful nerve signals. The treatment is painless.  Alternative treatments, such as: ? Acupuncture. ? Meditation. ? Massage. ? Physical therapy. ? Pain management programs. ? Counseling. Follow these instructions at home: Medicines  Take over-the-counter and prescription medicines only as told by your health care provider.  Do not drive or use heavy machinery while taking prescription pain medicine.  If you are taking prescription pain medicine, take actions to prevent or treat constipation. Your health care provider may recommend that you: ? Drink enough fluid to keep your urine pale yellow. ? Eat foods that are high in fiber, such as fresh fruits and vegetables, whole grains, and beans. ? Limit foods that are high in fat and processed sugars, such as fried or sweet foods. ? Take an over-the-counter or prescription medicine for constipation.   Lifestyle  Have a good support system at home.  Consider joining a chronic pain  support group.  Do not use any products that contain nicotine or tobacco, such as cigarettes and e-cigarettes. If you need help quitting, ask your health care provider.  Do not drink alcohol.   General instructions  Learn as much as you can about your condition.  Work closely with all your health care providers to find the treatment plan that works best for you.  Ask your health care provider what activities are safe for you.  Keep all follow-up visits as told by your health care provider. This is important. Contact a health care provider if:  Your pain treatments are not working.  You are having side effects from your medicines.  You are struggling with tiredness (fatigue), mood changes, depression, or anxiety. Summary  Neuropathic pain is pain caused by damage to the nerves that are responsible for certain sensations in your body (sensory nerves).  Neuropathic pain may come and go as damaged nerves heal, or it may stay at the same level for years.  Neuropathic pain is usually a long-term condition that can be difficult to treat. Consider joining a chronic pain support group. This information is not intended to replace advice given to you by your health care provider. Make sure you discuss any questions you have with your health care provider. Document Revised: 10/20/2018 Document Reviewed: 07/16/2017 Elsevier Patient  Education  2021 [employer]  

## 2020-11-25 NOTE — ED Notes (Signed)
Pt angry in the room requesting to speak with Charge RN, this RN went into pts room. Pt was upset that he is a diabetic and [facility] wasn't going do anything to treat patient. PT stated that he wanted to go to another hospital where he would be taken seriously. This RN explained the role of the ED and that it was to rule out life threatening emergencies and then to be referred out, pt was not happy with this, requesting to leave. AMA form was signed and pt was visualized leaving without any distress.

## 2020-11-25 NOTE — ED Notes (Signed)
Examined by PA   Pt requested to speak to charge nurse concerning care  Marlyn Corporal Rn aware

## 2020-11-25 NOTE — ED Triage Notes (Signed)
C/O numbness to fingertips, right and left  index and middle fingers, x 1 month.  AAOx3.  Skin warm and dry. NAD

## 2020-11-25 NOTE — Progress Notes (Signed)
Patient presents with numbness in his fingers and reports " I can not feel them" Patient shares that his job gives points when workers are out. Patient was not able to make his PCP appointments due to this concern.

## 2020-11-25 NOTE — ED Notes (Addendum)
See triage note  States he developed some numbness to fingers about 1 month ago  Denies any injury  Grips equal  States he is a diabetic  But does not know name of PCP

## 2020-11-25 NOTE — Progress Notes (Signed)
Name: Jerome Murphy   MRN: [id number]    DOB: Nov 28, 1990   Date:11/26/2020       Progress Note  Subjective  Chief Complaint  Numbness in finger  HPI  DMII : diagnosed in 2019 initially on metformin but on NPH insulin since admitted for DKA back in 07/2019. He only has medicaid family planning and has difficulty keeping up with appointments. He states he was compliant with medication since Feb 2022 because he went to [facility] for kidney stone and his A1C was very high at 11.7 %. He noticed some tingling on his fingers over the past few weeks and finally went to a walk in yesterday, his A1C was down to 8.2 %, vitamin D was little low, and glucose was elevated. He was given gabapentin but has not started yet. He has been checking glucose at home occasionally and can go from 90-120, sometimes can go up to 300. 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 Neuropathic pain 11/26/2020  . Vitamin D deficiency 11/26/2020  . Other fatigue 08/29/2020  . Mixed hyperlipidemia 10/24/2019  . Tobacco dependence 08/29/2019  . Non compliance w medication regimen 08/05/2019    Past Surgical History:  Procedure Laterality Date  . CIRCUMCISION    . HERNIA REPAIR    . TONSILLECTOMY      No family history on file.  Social History   Tobacco Use  . Smoking status: Current Every Day Smoker    Packs/day: 0.25    Types: Cigarettes  . Smokeless  tobacco: Never Used  Substance Use Topics  . Alcohol use: No    Comment: occasionallly     Current Outpatient Medications:  .  gabapentin (NEURONTIN) 100 MG capsule, Take 1 capsule (100 mg total) by mouth 3 (three) times daily., Disp: 90 capsule, Rfl: 0 .  Insulin Pen Needle 32G X 4 MM MISC, 1 application by Does not apply route 2 (two) times daily., Disp: 60 each, Rfl: 11 .  metFORMIN (GLUCOPHAGE XR) 750 MG 24 hr tablet, Take 1 tablet (750 mg total) by mouth in the morning and at bedtime., Disp: 180 tablet, Rfl: 0 .  Insulin NPH, Human,, Isophane, (HUMULIN N) 100 UNIT/ML Kiwkpen, Inject 12 Units into the skin in the morning and at bedtime., Disp: 15 mL, Rfl: 1 .  Vitamin D, Ergocalciferol, (DRISDOL) 1.25 MG (50000 UNIT) CAPS capsule, Take 1 capsule (50,000 Units total) by mouth every 7 (seven) days., Disp: 4 capsule, Rfl: 2  Allergies  Allergen Reactions  . Robitussin [Guaifenesin] Hives and Itching    I personally reviewed active problem list, medication list, allergies, family history, social history, health maintenance with the patient/caregiver today.   ROS  Constitutional: Negative for fever or weight change.  Respiratory: Negative for cough and shortness of breath.   Cardiovascular: Negative for chest pain or palpitations.  Gastrointestinal: Negative for  abdominal pain, no bowel changes.  Musculoskeletal: Negative for gait problem or joint swelling.  Skin: Negative for rash.  Neurological: Negative for dizziness or headache.  No other specific complaints in a complete review of systems (except as listed in HPI above).  Objective  Vitals:   11/26/20 1512  BP: 114/80  Pulse: 87  Resp: 16  Temp: 98.3 F (36.8 C)  TempSrc: Oral  SpO2: 98%  Weight: 187 lb (84.8 kg)  Height: 5\' 11"  (1.803 m)    Body mass index is 26.08 kg/m.  Physical Exam  Constitutional: Patient appears well-developed and well-nourished.  No distress.  HEENT: head atraumatic, normocephalic,  pupils equal and reactive to light,  neck supple, Cardiovascular: Normal rate, regular rhythm and normal heart sounds.  No murmur heard. No BLE edema. Pulmonary/Chest: Effort normal and breath sounds normal. No respiratory distress. Abdominal: Soft.  There is no tenderness. Muscular skeletal: tinnels sign positive  Psychiatric: Patient has a normal mood and affect. behavior is normal. Judgment and thought content normal.  Recent Results (from the past 2160 hour(s))  Glucose (CBG)     Status: Abnormal   Collection Time: 11/25/20 11:47 AM  Result Value Ref Range   POC Glucose 266 (A) 70 - 99 mg/dl  Vitamin D, 11/27/20     Status: Abnormal   Collection Time: 11/25/20 12:18 PM  Result Value Ref Range   Vit D, 25-Hydroxy 25.2 (L) 30.0 - 100.0 ng/mL    Comment: Vitamin D deficiency has been defined by the Institute of Medicine and an Endocrine Society practice guideline as a level of serum 25-OH vitamin D less than 20 ng/mL (1,2). The Endocrine Society went on to further define vitamin D insufficiency as a level between 21 and 29 ng/mL (2). 1. IOM (Institute of Medicine). 2010. Dietary reference    intakes for calcium and D. [place]: The    2011. 2. Holick MF, Binkley [place], Bischoff-Ferrari HA, et al.    Evaluation, treatment, and prevention of vitamin D    deficiency: an Endocrine Society clinical practice    guideline. JCEM. 2011 Jul; 96(7):1911-30.   HgB A1c     Status: Abnormal   Collection Time: 11/25/20 12:18 PM  Result Value Ref Range   Hemoglobin A1C 8.2 (A) 4.0 - 5.6 %   HbA1c POC (<> result, manual entry)     HbA1c, POC (prediabetic range)     HbA1c, POC (controlled diabetic range)      Diabetic Foot Exam: Diabetic Foot Exam - Simple   Simple Foot Form Diabetic Foot exam was performed with the following findings: Yes 11/26/2020  3:44 PM  Visual Inspection No deformities, no ulcerations, no other skin breakdown bilaterally: Yes Sensation  Testing Intact to touch and monofilament testing bilaterally: Yes Pulse Check Posterior Tibialis and Dorsalis pulse intact bilaterally: Yes Comments     PHQ2/9: Depression screen [facility] 2/9 11/26/2020 11/25/2020 10/24/2019 08/29/2019  Decreased Interest 0 2 0 0  Down, Depressed, Hopeless 0 0 0 0  PHQ - 2 Score 0 2 0 0  Altered sleeping 3 3 0 0  Tired, decreased energy 3 3 0 0  Change in appetite 0 0 0 0  Feeling bad or failure about yourself  0 0 0 0  Trouble concentrating 0 0 0 0  Moving slowly or fidgety/restless 0 0 0 0  Suicidal thoughts 0 0 0 0  PHQ-9 Score 6 8 0 0  Difficult doing work/chores - - Not difficult at all Not  difficult at all    phq 9 is negative   Fall Risk: Fall Risk  11/26/2020 10/24/2019 08/29/2019  Falls in the past year? 0 0 0  Number falls in past yr: 0 0 0  Injury with Fall? 0 0 0     Functional Status Survey: Is the patient deaf or have difficulty hearing?: No Does the patient have difficulty seeing, even when wearing glasses/contacts?: No Does the patient have difficulty concentrating, remembering, or making decisions?: No Does the patient have difficulty walking or climbing stairs?: No Does the patient have difficulty dressing or bathing?: No Does the patient have difficulty doing errands alone such as visiting a doctor's office or shopping?: No    Assessment & Plan  1. Type 2 diabetes mellitus with hyperglycemia, with long-term current use of insulin (HCC)  - Microalbumin / creatinine urine ratio - HM Diabetes Foot Exam - metFORMIN (GLUCOPHAGE XR) 750 MG 24 hr tablet; Take 1 tablet (750 mg total) by mouth in the morning and at bedtime.  Dispense: 180 tablet; Refill: 0 - Insulin NPH, Human,, Isophane, (HUMULIN N) 100 UNIT/ML Kiwkpen; Inject 12 Units into the skin in the morning and at bedtime.  Dispense: 15 mL; Refill: 1  2. Neuropathy  - Vitamin B12 Possible carpal tunnel   3. Vitamin D deficiency  - Vitamin D, Ergocalciferol, (DRISDOL)  1.25 MG (50000 UNIT) CAPS capsule; Take 1 capsule (50,000 Units total) by mouth every 7 (seven) days.  Dispense: 4 capsule; Refill: 2

## 2020-11-25 NOTE — Progress Notes (Signed)
Established Patient Office Visit  Subjective:  Patient ID: Jerome Murphy, male    DOB: 1991/05/17  Age: 30 y.o. MRN: [id number]  CC:  Chief Complaint  Patient presents with  . Diabetes    Neuropathy    HPI [name] reports that he has been having increasing numbness in all of his fingertips, states this has been present for several months but getting worse in the past few weeks.  Denies injury or trauma, has not tried anything for relief.  Reports that he has been compliant to his metformin and insulin injections, states that he does not check his blood glucose at home very often.  Reports that he has been unable to make his appointments for follow-up due to strict regulations of missing work.  States that he did take the 12 week regimen of vitamin D  Past Medical History:  Diagnosis Date  . Asthma   . Diabetes mellitus without complication (HCC)   . History of gallstones   . Narcolepsy   . Seizures (HCC)     Past Surgical History:  Procedure Laterality Date  . CIRCUMCISION    . HERNIA REPAIR    . TONSILLECTOMY      History reviewed. No pertinent family history.  Social History   Socioeconomic History  . Marital status: Single    Spouse name: Not on file  . Number of children: 2  . Years of education: Not on file  . Highest education level: Associate degree: occupational, Scientist, product/process development, or vocational program  Occupational History  . Occupation: Barbershop  Tobacco Use  . Smoking status: Current Every Day Smoker    Packs/day: 0.25    Types: Cigarettes  . Smokeless tobacco: Never Used  Vaping Use  . Vaping Use: Never used  Substance and Sexual Activity  . Alcohol use: No    Comment: occasionallly  . Drug use: No  . Sexual activity: Yes    Birth control/protection: Condom    Comment: Partner uses Depo  Other Topics Concern  . Not on file  Social History Narrative  . Not on file   Social Determinants of Health   Financial  Resource Strain: Not on file  Food Insecurity: Not on file  Transportation Needs: Not on file  Physical Activity: Not on file  Stress: Not on file  Social Connections: Not on file  Intimate Partner Violence: Not on file    Outpatient Medications Prior to Visit  Medication Sig Dispense Refill  . atorvastatin (LIPITOR) 10 MG tablet TAKE 1 TABLET (10 MG TOTAL) BY MOUTH DAILY. 90 tablet 3  . Insulin NPH, Human,, Isophane, (HUMULIN N) 100 UNIT/ML Kiwkpen INJECT 10 UNITS INTO THE SKIN 2 (TWO) TIMES DAILY. 15 mL 1  . Insulin Pen Needle 32G X 4 MM MISC 1 application by Does not apply route 2 (two) times daily. 60 each 11  . metFORMIN (GLUCOPHAGE) 500 MG tablet TAKE 2 TABLETS (1,000 MG TOTAL) BY MOUTH 2 (TWO) TIMES DAILY WITH A MEAL. 120 tablet 2  . Vitamin D, Ergocalciferol, (DRISDOL) 1.25 MG (50000 UNIT) CAPS capsule TAKE 1 CAPSULE (50,000 UNITS TOTAL) BY MOUTH EVERY 7 (SEVEN) DAYS. 4 capsule 2  . atorvastatin (LIPITOR) 10 MG tablet Take 1 tablet (10 mg total) by mouth daily. 90 tablet 3  . atorvastatin (LIPITOR) 10 MG tablet TAKE 1 TABLET (10 MG TOTAL) BY MOUTH DAILY. 90 tablet 3  . Insulin NPH, Human,, Isophane, (HUMULIN N KWIKPEN) 100 UNIT/ML Kiwkpen Inject 10 Units into the skin  2 (two) times daily. 15 mL 1  . Vitamin D, Ergocalciferol, (DRISDOL) 1.25 MG (50000 UNIT) CAPS capsule Take 1 capsule (50,000 Units total) by mouth every 7 (seven) days. 4 capsule 2  . Insulin NPH, Human,, Isophane, (HUMULIN N) 100 UNIT/ML Kiwkpen INJECT 10 UNITS INTO THE SKIN 2 (TWO) TIMES DAILY. 15 mL 1  . Insulin Pen Needle 32G X 4 MM MISC USE AS DIRECTED 2 (TWO) TIMES DAILY. 60 each 11  . Insulin Pen Needle 32G X 4 MM MISC USE AS DIRECTED. 100 each 11  . metFORMIN (GLUCOPHAGE) 500 MG tablet Take 2 tablets (1,000 mg total) by mouth 2 (two) times daily with a meal. 120 tablet 2  . metFORMIN (GLUCOPHAGE) 500 MG tablet TAKE 2 TABLETS (1,000 MG TOTAL) BY MOUTH 2 (TWO) TIMES DAILY WITH A MEAL. 120 tablet 2   No  facility-administered medications prior to visit.    Allergies  Allergen Reactions  . Robitussin [Guaifenesin] Hives and Itching    ROS Review of Systems  Constitutional: Negative for chills and fever.  HENT: Negative.   Eyes: Negative.   Respiratory: Negative for shortness of breath.   Cardiovascular: Negative for chest pain.  Gastrointestinal: Negative.   Endocrine: Negative.   Genitourinary: Negative.   Musculoskeletal: Negative.   Skin: Negative.   Allergic/Immunologic: Negative.   Neurological: Negative for dizziness and weakness.  Hematological: Negative.   Psychiatric/Behavioral: Negative.       Objective:    Physical Exam Vitals and nursing note reviewed.  Constitutional:      Appearance: Normal appearance.  HENT:     Head: Normocephalic and atraumatic.     Right Ear: External ear normal.     Left Ear: External ear normal.     Nose: Nose normal.     Mouth/Throat:     Mouth: Mucous membranes are moist.     Pharynx: Oropharynx is clear.  Eyes:     Extraocular Movements: Extraocular movements intact.     Conjunctiva/sclera: Conjunctivae normal.     Pupils: Pupils are equal, round, and reactive to light.  Cardiovascular:     Rate and Rhythm: Normal rate.     Pulses: Normal pulses.     Heart sounds: Normal heart sounds.  Pulmonary:     Effort: Pulmonary effort is normal.     Breath sounds: Normal breath sounds.  Musculoskeletal:        General: Normal range of motion.     Cervical back: Normal range of motion and neck supple.  Skin:    General: Skin is warm and dry.     Capillary Refill: Capillary refill takes less than 2 seconds.  Neurological:     General: No focal deficit present.     Mental Status: He is alert and oriented to person, place, and time.  Psychiatric:        Mood and Affect: Mood normal.        Behavior: Behavior normal.        Thought Content: Thought content normal.        Judgment: Judgment normal.     BP 116/76 (BP Location:  Left Arm, Patient Position: Sitting, Cuff Size: Normal)   Pulse 80   Temp 98.2 F (36.8 C) (Oral)   Resp 18   Ht 5\' 11"  (1.803 m)   Wt 190 lb (86.2 kg)   SpO2 98%   BMI 26.50 kg/m  Wt Readings from Last 3 Encounters:  11/25/20 190 lb (86.2 kg)  11/25/20 186 lb  15.2 oz (84.8 kg)  08/28/20 187 lb (84.8 kg)     Health Maintenance Due  Topic Date Due  . PNEUMOCOCCAL POLYSACCHARIDE VACCINE AGE 31-64 HIGH RISK  Never done  . COVID-19 Vaccine (1) Never done  . OPHTHALMOLOGY EXAM  Never done  . Hepatitis C Screening  Never done  . TETANUS/TDAP  Never done  . FOOT EXAM  08/02/2020  . URINE MICROALBUMIN  08/28/2020    There are no preventive care reminders to display for this patient.  Lab Results  Component Value Date   TSH 1.130 08/28/2020   Lab Results  Component Value Date   WBC 6.9 08/16/2020   HGB 13.2 08/16/2020   HCT 38.2 (L) 08/16/2020   MCV 92.3 08/16/2020   PLT 202 08/16/2020   Lab Results  Component Value Date   NA 136 08/28/2020   K 4.4 08/28/2020   CO2 24 08/16/2020   GLUCOSE 275 (H) 08/28/2020   BUN 11 08/28/2020   CREATININE 0.86 08/28/2020   BILITOT 0.4 08/28/2020   ALKPHOS 86 08/28/2020   AST 9 08/28/2020   ALT 11 08/16/2020   PROT 6.9 08/28/2020   ALBUMIN 4.4 08/28/2020   CALCIUM 9.1 08/28/2020   ANIONGAP 8 08/16/2020   Lab Results  Component Value Date   CHOL 132 08/28/2020   Lab Results  Component Value Date   HDL 37 (L) 08/28/2020   Lab Results  Component Value Date   LDLCALC 82 08/28/2020   Lab Results  Component Value Date   TRIG 63 08/28/2020   Lab Results  Component Value Date   CHOLHDL 3.6 08/28/2020   Lab Results  Component Value Date   HGBA1C 8.2 (A) 11/25/2020      Assessment & Plan:   Problem List Items Addressed This Visit      Other   Mixed hyperlipidemia   Neuropathic pain   Relevant Medications   gabapentin (NEURONTIN) 100 MG capsule   Vitamin D deficiency   Relevant Orders   Vitamin D, 25-hydroxy  (Completed)    Other Visit Diagnoses    Type 2 diabetes mellitus with hyperglycemia, with long-term current use of insulin (HCC)    -  Primary   Relevant Medications   gabapentin (NEURONTIN) 100 MG capsule   Other Relevant Orders   Glucose (CBG) (Completed)   HgB A1c (Completed)    1. Type 2 diabetes mellitus with hyperglycemia, with long-term current use of insulin (HCC) A1c improved from 11.7 in February 20 22-8.2 today.  Congratulated patient on compliance to medication.  Reports that he has an appointment with a new primary care provider tomorrow at 3 PM. - Glucose (CBG) - HgB A1c - gabapentin (NEURONTIN) 100 MG capsule; Take 1 capsule (100 mg total) by mouth 3 (three) times daily.  Dispense: 90 capsule; Refill: 0  2. Mixed hyperlipidemia   3. Vitamin D deficiency  - Vitamin D, 25-hydroxy  4. Neuropathic pain Trial gabapentin.  Red flags given for prompt reevaluation - gabapentin (NEURONTIN) 100 MG capsule; Take 1 capsule (100 mg total) by mouth 3 (three) times daily.  Dispense: 90 capsule; Refill: 0  Patient encouraged to check with his work human resources to find out necessary steps for him to be able to attend his medical appointments.  Patient understands and agrees. Meds ordered this encounter  Medications  . DISCONTD: gabapentin (NEURONTIN) 100 MG capsule    Sig: Take 1 capsule (100 mg total) by mouth 3 (three) times daily.    Dispense:  90 capsule    Refill:  0    Order Specific Question:   Supervising Provider    Answer:   Shan LevansWRIGHT, PATRICK E [[id number]]  . gabapentin (NEURONTIN) 100 MG capsule    Sig: Take 1 capsule (100 mg total) by mouth 3 (three) times daily.    Dispense:  90 capsule    Refill:  0    I have reviewed the patient's medical history (PMH, PSH, Social History, Family History, Medications, and allergies) , and have been updated if relevant. I spent 32 minutes reviewing chart and  face to face time with patient.    Follow-up: Return if symptoms  worsen or fail to improve.    Kasandra Knudsenari S Mayers, PA-C

## 2020-11-26 ENCOUNTER — Other Ambulatory Visit: Payer: Self-pay

## 2020-11-26 ENCOUNTER — Telehealth: Payer: Self-pay | Admitting: *Deleted

## 2020-11-26 ENCOUNTER — Encounter: Payer: Self-pay | Admitting: Family Medicine

## 2020-11-26 ENCOUNTER — Ambulatory Visit: Payer: Medicaid Other | Admitting: Family Medicine

## 2020-11-26 VITALS — BP 114/80 | HR 87 | Temp 98.3°F | Resp 16 | Ht 71.0 in | Wt 187.0 lb

## 2020-11-26 DIAGNOSIS — E559 Vitamin D deficiency, unspecified: Secondary | ICD-10-CM | POA: Insufficient documentation

## 2020-11-26 DIAGNOSIS — Z1159 Encounter for screening for other viral diseases: Secondary | ICD-10-CM

## 2020-11-26 DIAGNOSIS — M792 Neuralgia and neuritis, unspecified: Secondary | ICD-10-CM | POA: Insufficient documentation

## 2020-11-26 DIAGNOSIS — G629 Polyneuropathy, unspecified: Secondary | ICD-10-CM

## 2020-11-26 DIAGNOSIS — E1165 Type 2 diabetes mellitus with hyperglycemia: Secondary | ICD-10-CM

## 2020-11-26 DIAGNOSIS — Z23 Encounter for immunization: Secondary | ICD-10-CM

## 2020-11-26 DIAGNOSIS — Z794 Long term (current) use of insulin: Secondary | ICD-10-CM

## 2020-11-26 LAB — VITAMIN D 25 HYDROXY (VIT D DEFICIENCY, FRACTURES): Vit D, 25-Hydroxy: 25.2 ng/mL — ABNORMAL LOW (ref 30.0–100.0)

## 2020-11-26 MED ORDER — INSULIN ISOPHANE HUMAN 100 UNIT/ML KWIKPEN
12.0000 [IU] | PEN_INJECTOR | Freq: Two times a day (BID) | SUBCUTANEOUS | 1 refills | Status: DC
Start: 2020-11-26 — End: 2021-01-16
  Filled 2020-11-26: qty 15, 63d supply, fill #0
  Filled 2020-11-29: qty 6, 25d supply, fill #0

## 2020-11-26 MED ORDER — METFORMIN HCL ER 750 MG PO TB24
750.0000 mg | ORAL_TABLET | Freq: Two times a day (BID) | ORAL | 0 refills | Status: DC
Start: 2020-11-26 — End: 2021-01-16
  Filled 2020-11-26: qty 60, 30d supply, fill #0

## 2020-11-26 MED ORDER — VITAMIN D (ERGOCALCIFEROL) 1.25 MG (50000 UNIT) PO CAPS
1.0000 | ORAL_CAPSULE | ORAL | 2 refills | Status: DC
  Filled 2020-11-26: qty 4, 28d supply, fill #0

## 2020-11-26 NOTE — Telephone Encounter (Signed)
Patient verified DOB Patient is aware of needing OTC 2000 iu of vitamin to continue to address low level. Patient had no questions.

## 2020-11-26 NOTE — Telephone Encounter (Signed)
-----   Message from Roney Jaffe, [place] sent at 11/26/2020 11:45 AM EDT ----- Please call patient and let him know that his vitamin D is much improved, however still below normal limits.  He should take 2000 international units once a day.  He will purchase this over-the-counter.

## 2020-11-27 LAB — MICROALBUMIN / CREATININE URINE RATIO
Creatinine, Urine: 285 mg/dL (ref 20–320)
Microalb Creat Ratio: 4 mcg/mg creat (ref <30)
Microalb, Ur: 1.2 mg/dL

## 2020-11-27 LAB — VITAMIN B12: Vitamin B-12: 326 pg/mL (ref 200–1100)

## 2020-11-29 ENCOUNTER — Other Ambulatory Visit: Payer: Self-pay

## 2020-12-02 ENCOUNTER — Other Ambulatory Visit: Payer: Self-pay

## 2020-12-11 ENCOUNTER — Other Ambulatory Visit: Payer: Self-pay

## 2021-01-07 ENCOUNTER — Other Ambulatory Visit: Payer: Self-pay

## 2021-01-16 ENCOUNTER — Other Ambulatory Visit: Payer: Self-pay

## 2021-01-16 ENCOUNTER — Ambulatory Visit: Payer: Self-pay | Attending: Critical Care Medicine | Admitting: Critical Care Medicine

## 2021-01-16 ENCOUNTER — Encounter: Payer: Self-pay | Admitting: Critical Care Medicine

## 2021-01-16 DIAGNOSIS — M792 Neuralgia and neuritis, unspecified: Secondary | ICD-10-CM

## 2021-01-16 DIAGNOSIS — E559 Vitamin D deficiency, unspecified: Secondary | ICD-10-CM

## 2021-01-16 DIAGNOSIS — E1165 Type 2 diabetes mellitus with hyperglycemia: Secondary | ICD-10-CM

## 2021-01-16 DIAGNOSIS — E782 Mixed hyperlipidemia: Secondary | ICD-10-CM

## 2021-01-16 DIAGNOSIS — F17200 Nicotine dependence, unspecified, uncomplicated: Secondary | ICD-10-CM

## 2021-01-16 DIAGNOSIS — F17210 Nicotine dependence, cigarettes, uncomplicated: Secondary | ICD-10-CM

## 2021-01-16 DIAGNOSIS — Z794 Long term (current) use of insulin: Secondary | ICD-10-CM

## 2021-01-16 MED ORDER — METFORMIN HCL ER 750 MG PO TB24
750.0000 mg | ORAL_TABLET | Freq: Two times a day (BID) | ORAL | 0 refills | Status: DC
  Filled 2021-01-16 – 2021-02-05 (×2): qty 60, 30d supply, fill #0
  Filled 2021-03-24: qty 60, 30d supply, fill #1
  Filled 2021-06-02: qty 60, 30d supply, fill #2

## 2021-01-16 MED ORDER — RELION BLOOD GLUCOSE TEST VI STRP
ORAL_STRIP | 12 refills | Status: DC
  Filled 2021-01-16: qty 100, fill #0

## 2021-01-16 MED ORDER — ATORVASTATIN CALCIUM 20 MG PO TABS
20.0000 mg | ORAL_TABLET | Freq: Every day | ORAL | 3 refills | Status: DC
  Filled 2021-01-16 – 2021-02-05 (×2): qty 30, 30d supply, fill #0

## 2021-01-16 MED ORDER — VITAMIN D (ERGOCALCIFEROL) 1.25 MG (50000 UNIT) PO CAPS
50000.0000 [IU] | ORAL_CAPSULE | ORAL | 2 refills | Status: AC
Start: 2021-01-16 — End: 2022-01-16
  Filled 2021-01-16 – 2021-02-05 (×2): qty 4, 28d supply, fill #0
  Filled 2021-03-24: qty 4, 28d supply, fill #1
  Filled 2021-07-25 – 2021-08-13 (×2): qty 4, 28d supply, fill #0

## 2021-01-16 MED ORDER — TRUEPLUS LANCETS 28G MISC
1 refills | Status: DC
  Filled 2021-01-16: qty 100, 25d supply, fill #0

## 2021-01-16 MED ORDER — TRUE METRIX METER W/DEVICE KIT
PACK | 0 refills | Status: DC
  Filled 2021-01-16: qty 1, 30d supply, fill #0

## 2021-01-16 MED ORDER — INSULIN ISOPHANE HUMAN 100 UNIT/ML KWIKPEN
12.0000 [IU] | PEN_INJECTOR | Freq: Two times a day (BID) | SUBCUTANEOUS | 1 refills | Status: DC
  Filled 2021-01-16 – 2021-03-07 (×2): qty 15, 63d supply, fill #0
  Filled 2021-03-14: qty 6, 25d supply, fill #0
  Filled 2021-03-24: qty 15, 63d supply, fill #1
  Filled 2021-06-02: qty 9, 38d supply, fill #2

## 2021-01-16 MED ORDER — TRUE METRIX BLOOD GLUCOSE TEST VI STRP
ORAL_STRIP | 12 refills | Status: DC
  Filled 2021-01-16: qty 100, 25d supply, fill #0

## 2021-01-16 NOTE — Progress Notes (Signed)
New Patient Office Visit  Subjective:  Patient ID: Jerome Murphy, male    DOB: Apr 16, 1991  Age: 30 y.o. MRN: [id number] Virtual Visit via Telephone Note  I connected with [name] on 01/16/21 at  8:30 AM EDT by telephone and verified that I am speaking with the correct person using two identifiers.   Consent:  I discussed the limitations, risks, security and privacy concerns of performing an evaluation and management service by telephone and the availability of in person appointments. I also discussed with the patient that there may be a patient responsible charge related to this service. The patient expressed understanding and agreed to proceed.  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   No one else on the call    History of Present Illness:   CC: PCP to est , DM care   HPI [name] presents for transition to primary care.  He was going to a [facility] practice that was charging a co-pay and he cannot afford this.  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  He is hoping to get private insurance but is yet to achieve this.  This is a phone visit as he is driving a truck now cannot be on video.  Patient's last visit in May with the [facility] health medical group practice is as documented below Saw [facility] 11/2020 DMII : diagnosed in 2019 initially on metformin but on NPH insulin since admitted for DKA back in 07/2019. He only has medicaid family planning and has difficulty keeping up with appointments. He states he was compliant with medication since Feb 2022 because he went to [facility] for kidney stone and his A1C was very high at 11.7 %. He noticed some tingling on his fingers over the past few weeks and finally went to a walk in yesterday, his A1C was down to 8.2 %,  vitamin D was little low, and glucose was elevated. He was given gabapentin but has not started yet. He has been checking glucose at home occasionally and can go from 90-120, sometimes can go up to 300. Explained that low b12 can cause neuropathy and recommend to check level today and if negative gabapentin may be a good option for diabetic neuropathy Explained his last LDL was not at goal and we will adjust dose of atorvastatin to 20 mg Remind him to have yearly urine micro and an eye exam   He also states has a new job, 2 months ago, working for [employer]  and does a lot of loading and unloading and opening boxes, he is right hand dominant, explained it may be carpal tunnel syndrome and to try to wear a brace, avoid gabapentin if symptoms not severe   1. Type 2 diabetes mellitus with hyperglycemia, with long-term current use of insulin (HCC)   - Microalbumin / creatinine urine ratio - [facility] Diabetes Foot Exam - metFORMIN (GLUCOPHAGE XR) 750 MG 24 hr tablet; Take 1 tablet (750 mg total) by mouth in the morning and at bedtime.  Dispense: 180 tablet; Refill: 0 - Insulin NPH, Human,, Isophane, (HUMULIN N) 100 UNIT/ML Kiwkpen; Inject 12 Units into the skin in the morning and at bedtime.  Dispense: 15 mL; Refill: 1   2. Neuropathy   - Vitamin B12 Possible carpal tunnel  3. Vitamin D deficiency   - Vitamin D, Ergocalciferol, (DRISDOL) 1.25 MG (50000 UNIT) CAPS capsule; Take 1 capsule (50,000 Units total) by mouth every 7 (seven) days.  Dispense: 4 capsule; Refill: 2  Patient states since that visit his blood glucoses are running in the low 100 range occasionally he gets up to 160 postprandial.  He does need refill on his ReliOn glucose test strips.  He is smoking a pack a day of cigarettes.  He has no other complaints on review of systems at this time.  He had a prior admission in January 2021 for DKA and has not had further emergency room visits since that time and is using his insulin and metformin as  prescribed  Past Medical History:  Diagnosis Date   Asthma    Diabetes mellitus without complication (HCC)    History of gallstones    Narcolepsy    Seizures (HCC)     Past Surgical History:  Procedure Laterality Date   CIRCUMCISION     HERNIA REPAIR     TONSILLECTOMY      Family History  Problem Relation Age of Onset   Diabetes Mother    Diabetes Maternal Uncle     Social History   Socioeconomic History   Marital status: Single    Spouse name: Not on file   Number of children: 2   Years of education: Not on file   Highest education level: Associate degree: occupational, Scientist, product/process development, or vocational program  Occupational History   Occupation: Barbershop  Tobacco Use   Smoking status: Every Day    Packs/day: 0.25    Pack years: 0.00    Types: Cigarettes   Smokeless tobacco: Never  Vaping Use   Vaping Use: Never used  Substance and Sexual Activity   Alcohol use: No    Comment: occasionallly   Drug use: No   Sexual activity: Yes    Birth control/protection: Condom    Comment: Partner uses Depo  Other Topics Concern   Not on file  Social History Narrative   Not on file   Social Determinants of Health   Financial Resource Strain: Not on file  Food Insecurity: Not on file  Transportation Needs: Not on file  Physical Activity: Not on file  Stress: Not on file  Social Connections: Not on file  Intimate Partner Violence: Not on file    ROS Review of Systems  Constitutional: Negative.   HENT: Negative.    Eyes: Negative.   Respiratory: Negative.    Cardiovascular: Negative.   Gastrointestinal: Negative.   Musculoskeletal:        Hand pain  Skin: Negative.   Psychiatric/Behavioral:  Negative for dysphoric mood, self-injury and suicidal ideas. The patient is not nervous/anxious.    Objective:   Today's Vitals: There were no vitals taken for this visit. 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 we will hold Neurontin for now and hope to control his sugars and if this  will result in improvement in neuropathy       Relevant Medications   atorvastatin (LIPITOR) 20 MG tablet   Insulin NPH, Human,, Isophane, (HUMULIN N) 100 UNIT/ML Kiwkpen   metFORMIN (GLUCOPHAGE XR) 750 MG 24 hr tablet     Other   Tobacco dependence       Current smoking consumption amount: 1 pack a day  Dicsussion on advise to quit smoking and smoking impacts: Cardiovascular impacts  Patient's willingness to quit: Wants to quit  Methods to quit smoking discussed: Behavioral modification nicotine replacement  Medication management of smoking session drugs discussed: Nicotine replacement using a vaping device was approved   Setting quit date not established  Follow-up arranged 4 to 6 weeks   Time spent counseling the patient: 5 minutes        Mixed hyperlipidemia    Instructed patient is to continue atorvastatin as prescribed       Relevant Medications   atorvastatin (LIPITOR) 20 MG tablet   Neuropathic pain    Due to diabetes we will try to control diabetes more tightly and avoid Neurontin       Vitamin D deficiency    Note B12 levels normal however he does documented to have vitamin D deficiency he is taking vitamin D and will recommend he stay on this weekly       Relevant Medications   Vitamin D, Ergocalciferol, (DRISDOL) 1.25 MG (50000 UNIT) CAPS capsule    Outpatient Encounter Medications as of 01/16/2021  Medication Sig   atorvastatin (LIPITOR) 20 MG tablet Take 1 tablet (20 mg  total) by mouth daily.   glucose blood (RELION GLUCOSE TEST STRIPS) test strip Use as instructed   Insulin Pen Needle 32G X 4 MM MISC 1 application by Does not apply route 2 (two) times daily.   [DISCONTINUED] Insulin NPH, Human,, Isophane, (HUMULIN N) 100 UNIT/ML Kiwkpen Inject 12 Units into the skin in the morning and at bedtime.   [DISCONTINUED] metFORMIN (GLUCOPHAGE XR) 750 MG 24 hr tablet Take 1 tablet (750 mg total) by mouth in the morning and at bedtime.   [DISCONTINUED] Vitamin D, Ergocalciferol, (DRISDOL) 1.25 MG (50000 UNIT) CAPS capsule Take 1 capsule (50,000 Units total) by mouth every 7 (seven) days.   Insulin NPH, Human,, Isophane, (HUMULIN N) 100 UNIT/ML Kiwkpen Inject 12 Units into the skin in the morning and at bedtime.   metFORMIN (GLUCOPHAGE XR) 750 MG 24 hr tablet Take 1 tablet (750 mg total) by mouth in the morning and at bedtime.   Vitamin D, Ergocalciferol, (DRISDOL) 1.25 MG (50000 UNIT) CAPS capsule Take 1 capsule (50,000 Units total) by mouth every 7 (seven) days.   [DISCONTINUED] gabapentin (NEURONTIN) 100 MG capsule Take 1 capsule (100 mg total) by mouth 3 (three) times daily. (Patient not taking: Reported on 01/16/2021)   No facility-administered encounter medications on file as of 01/16/2021.    Follow-up: Return in about 6 weeks (around 02/27/2021).   Follow Up Instructions: Patient knows a face-to-face exam will occur in the next 4 to 6 weeks refills on all medications sent to the pharmacy   I discussed the assessment and treatment plan with the patient. The patient was provided an opportunity to ask questions and all were answered. The patient agreed with the plan and demonstrated an understanding of the instructions.   The patient was advised to call back or seek an in-person evaluation if the symptoms worsen or if the condition fails to improve as anticipated.  I provided 31  minutes of non-face-to-face time during this encounter  including  median intraservice time  , review of notes, labs, imaging, medications  and explaining diagnosis and management to the patient .    Shan LevansPatrick Ashland Osmer, MD

## 2021-01-16 NOTE — Assessment & Plan Note (Signed)
Instructed patient is to continue atorvastatin as prescribed

## 2021-01-16 NOTE — Assessment & Plan Note (Signed)
  .   Current smoking consumption amount: 1 pack a day  . Dicsussion on advise to quit smoking and smoking impacts: Cardiovascular impacts  . Patient's willingness to quit: Wants to quit  . Methods to quit smoking discussed: Behavioral modification nicotine replacement  . Medication management of smoking session drugs discussed: Nicotine replacement using a vaping device was approved   . Setting quit date not established  . Follow-up arranged 4 to 6 weeks   Time spent counseling the patient: 5 minutes

## 2021-01-16 NOTE — Assessment & Plan Note (Signed)
Due to diabetes we will try to control diabetes more tightly and avoid Neurontin

## 2021-01-16 NOTE — Assessment & Plan Note (Signed)
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

## 2021-01-16 NOTE — Assessment & Plan Note (Signed)
Note B12 levels normal however he does documented to have vitamin D deficiency he is taking vitamin D and will recommend he stay on this weekly

## 2021-01-23 ENCOUNTER — Other Ambulatory Visit: Payer: Self-pay

## 2021-02-05 ENCOUNTER — Other Ambulatory Visit: Payer: Self-pay

## 2021-02-09 ENCOUNTER — Emergency Department: Payer: Medicaid Other

## 2021-02-09 ENCOUNTER — Emergency Department
Admission: EM | Admit: 2021-02-09 | Discharge: 2021-02-10 | Disposition: A | Discharge status: Home / Self Care | Payer: Medicaid Other | Attending: Emergency Medicine | Admitting: Emergency Medicine

## 2021-02-09 ENCOUNTER — Encounter: Payer: Self-pay | Admitting: *Deleted

## 2021-02-09 ENCOUNTER — Other Ambulatory Visit: Payer: Self-pay

## 2021-02-09 DIAGNOSIS — Z28310 Unvaccinated for covid-19: Secondary | ICD-10-CM | POA: Insufficient documentation

## 2021-02-09 DIAGNOSIS — Z20822 Contact with and (suspected) exposure to covid-19: Secondary | ICD-10-CM | POA: Insufficient documentation

## 2021-02-09 DIAGNOSIS — R531 Weakness: Secondary | ICD-10-CM | POA: Insufficient documentation

## 2021-02-09 DIAGNOSIS — J181 Lobar pneumonia, unspecified organism: Secondary | ICD-10-CM | POA: Insufficient documentation

## 2021-02-09 DIAGNOSIS — J029 Acute pharyngitis, unspecified: Secondary | ICD-10-CM | POA: Insufficient documentation

## 2021-02-09 DIAGNOSIS — R5383 Other fatigue: Secondary | ICD-10-CM | POA: Insufficient documentation

## 2021-02-09 DIAGNOSIS — J45909 Unspecified asthma, uncomplicated: Secondary | ICD-10-CM | POA: Insufficient documentation

## 2021-02-09 DIAGNOSIS — R519 Headache, unspecified: Secondary | ICD-10-CM | POA: Insufficient documentation

## 2021-02-09 DIAGNOSIS — J189 Pneumonia, unspecified organism: Secondary | ICD-10-CM

## 2021-02-09 DIAGNOSIS — Z7984 Long term (current) use of oral hypoglycemic drugs: Secondary | ICD-10-CM | POA: Insufficient documentation

## 2021-02-09 DIAGNOSIS — F17210 Nicotine dependence, cigarettes, uncomplicated: Secondary | ICD-10-CM | POA: Insufficient documentation

## 2021-02-09 DIAGNOSIS — Z794 Long term (current) use of insulin: Secondary | ICD-10-CM | POA: Insufficient documentation

## 2021-02-09 DIAGNOSIS — E119 Type 2 diabetes mellitus without complications: Secondary | ICD-10-CM | POA: Insufficient documentation

## 2021-02-09 DIAGNOSIS — M791 Myalgia, unspecified site: Secondary | ICD-10-CM | POA: Insufficient documentation

## 2021-02-09 LAB — CBC WITH DIFFERENTIAL/PLATELET
Abs Immature Granulocytes: 0.02 10*3/uL (ref 0.00–0.07)
Basophils Absolute: 0 10*3/uL (ref 0.0–0.1)
Basophils Relative: 0 %
Eosinophils Absolute: 0.5 10*3/uL (ref 0.0–0.5)
Eosinophils Relative: 6 %
HCT: 39.2 % (ref 39.0–52.0)
Hemoglobin: 13.9 g/dL (ref 13.0–17.0)
Immature Granulocytes: 0 %
Lymphocytes Relative: 26 %
Lymphs Abs: 2.3 10*3/uL (ref 0.7–4.0)
MCH: 32 pg (ref 26.0–34.0)
MCHC: 35.5 g/dL (ref 30.0–36.0)
MCV: 90.3 fL (ref 80.0–100.0)
Monocytes Absolute: 0.7 10*3/uL (ref 0.1–1.0)
Monocytes Relative: 8 %
Neutro Abs: 5.4 10*3/uL (ref 1.7–7.7)
Neutrophils Relative %: 60 %
Platelets: 236 10*3/uL (ref 150–400)
RBC: 4.34 MIL/uL (ref 4.22–5.81)
RDW: 11.5 % (ref 11.5–15.5)
WBC: 8.9 10*3/uL (ref 4.0–10.5)
nRBC: 0 % (ref 0.0–0.2)

## 2021-02-09 MED ORDER — KETOROLAC TROMETHAMINE 30 MG/ML IJ SOLN
15.0000 mg | Freq: Once | INTRAMUSCULAR | Status: AC
  Administered 2021-02-09: 15 mg via INTRAVENOUS
  Filled 2021-02-09: qty 1

## 2021-02-09 MED ORDER — LACTATED RINGERS IV BOLUS
1000.0000 mL | Freq: Once | INTRAVENOUS | Status: AC
  Administered 2021-02-09: 1000 mL via INTRAVENOUS

## 2021-02-09 MED ORDER — IPRATROPIUM-ALBUTEROL 0.5-2.5 (3) MG/3ML IN SOLN
3.0000 mL | Freq: Once | RESPIRATORY_TRACT | Status: AC
  Administered 2021-02-09: 3 mL via RESPIRATORY_TRACT
  Filled 2021-02-09: qty 3

## 2021-02-09 MED ORDER — METHYLPREDNISOLONE SODIUM SUCC 125 MG IJ SOLR
125.0000 mg | Freq: Once | INTRAMUSCULAR | Status: AC
  Administered 2021-02-09: 125 mg via INTRAVENOUS
  Filled 2021-02-09: qty 2

## 2021-02-09 MED ORDER — ACETAMINOPHEN 500 MG PO TABS
1000.0000 mg | ORAL_TABLET | Freq: Once | ORAL | Status: AC
  Administered 2021-02-09: 1000 mg via ORAL
  Filled 2021-02-09: qty 2

## 2021-02-09 MED ORDER — ONDANSETRON HCL 4 MG/2ML IJ SOLN
4.0000 mg | Freq: Once | INTRAMUSCULAR | Status: AC
  Administered 2021-02-09: 4 mg via INTRAVENOUS
  Filled 2021-02-09: qty 2

## 2021-02-09 NOTE — ED Provider Notes (Signed)
[facility] Emergency Department Provider Note  ____________________________________________  Time seen: Approximately 11:38 PM  I have reviewed the triage vital signs and the nursing notes.   HISTORY  Chief Complaint Generalized Body Aches   HPI Jerome Murphy is a 30 y.o. male with a history of asthma, diabetes, smoking who presents for evaluation of COVID-like symptoms.  Patient reports that his symptoms started 3 days ago.  He has had generalized body aches, severe headaches, sore throat, congestion, cough, shortness of breath, burning chest pain, generalized weakness and fatigue.  Denies any known exposures to COVID or flu. Unvaccinated against covid.   Past Medical History:  Diagnosis Date   Asthma    Diabetes mellitus without complication ([place])    History of gallstones    Narcolepsy    Seizures ([place])     Patient Active Problem List   Diagnosis Date Noted   Neuropathic pain 11/26/2020   Vitamin D deficiency 11/26/2020   Other fatigue 08/29/2020   Mixed hyperlipidemia 10/24/2019   Tobacco dependence 08/29/2019   Type 2 diabetes mellitus ([place]) 08/29/2019    Past Surgical History:  Procedure Laterality Date   CIRCUMCISION     HERNIA REPAIR     TONSILLECTOMY      Prior to Admission medications   Medication Sig Start Date End Date Taking? Authorizing Provider  albuterol (VENTOLIN HFA) 108 (90 Base) MCG/ACT inhaler Inhale 2 puffs into the lungs every 6 (six) hours as needed for wheezing or shortness of breath. 02/10/21  Yes Alfred Levins, [place], MD  azithromycin [facility]) 250 MG tablet Take 1 a day for 4 days 02/10/21  Yes Alfred Levins, [place], MD  predniSONE (DELTASONE) 20 MG tablet Take 3 tablets (60 mg total) by mouth daily with breakfast for 4 days. 02/10/21 02/14/21 Yes Arlin Savona, [place], MD  pseudoephedrine (SUDAFED) 30 MG tablet Take 1 tablet (30 mg total) by mouth every 6 (six) hours as needed for congestion. 02/10/21 02/10/22 Yes  Tulani Kidney, [place], MD  atorvastatin (LIPITOR) 20 MG tablet Take 1 tablet (20 mg total) by mouth daily. 01/16/21   Elsie Stain, MD  Blood Glucose Monitoring Suppl (TRUE METRIX METER) w/Device KIT Use to measure blood sugar twice a day 01/16/21   Elsie Stain, MD  glucose blood (TRUE METRIX BLOOD GLUCOSE TEST) test strip Use as instructed 01/16/21   Elsie Stain, MD  Insulin NPH, Human,, Isophane, (HUMULIN N) 100 UNIT/ML Kiwkpen Inject 12 Units into the skin in the morning and at bedtime. 01/16/21 01/16/22  Elsie Stain, MD  Insulin Pen Needle 32G X 4 MM MISC 1 application by Does not apply route 2 (two) times daily. 08/28/20   Mayers, Cari S, PA-C  metFORMIN (GLUCOPHAGE XR) 750 MG 24 hr tablet Take 1 tablet (750 mg total) by mouth in the morning and at bedtime. 01/16/21   Elsie Stain, MD  TRUEplus Lancets 28G MISC Use to measure blood sugar twice a day 01/16/21   Elsie Stain, MD  Vitamin D, Ergocalciferol, (DRISDOL) 1.25 MG (50000 UNIT) CAPS capsule Take 1 capsule (50,000 Units total) by mouth every 7 (seven) days. 01/16/21 01/16/22  Elsie Stain, MD    Allergies Robitussin [guaifenesin]  Family History  Problem Relation Age of Onset   Diabetes Mother    Diabetes Maternal Uncle     Social History Social History   Tobacco Use   Smoking status: Every Day    Packs/day: 0.25    Types: Cigarettes   Smokeless tobacco:  Never  Vaping Use   Vaping Use: Never used  Substance Use Topics   Alcohol use: No    Comment: occasionallly   Drug use: No    Review of Systems  Constitutional: + fever, body aches, generalized weakness Eyes: Negative for visual changes. ENT: + sore throat, congestion Neck: No neck pain  Cardiovascular: + chest pain. Respiratory: +shortness of breath and cough Gastrointestinal: Negative for abdominal pain, vomiting or diarrhea. Genitourinary: Negative for dysuria. Musculoskeletal: Negative for back pain. Skin: Negative for  rash. Neurological: Negative for headaches, weakness or numbness. Psych: No SI or HI  ____________________________________________   PHYSICAL EXAM:  VITAL SIGNS: ED Triage Vitals  Enc Vitals Group     BP 02/09/21 2253 131/78     Pulse Rate 02/09/21 2253 (!) 105     Resp 02/09/21 2253 (!) 22     Temp 02/09/21 2253 99 F (37.2 C)     Temp src --      SpO2 02/09/21 2253 96 %     Weight --      Height --      Head Circumference --      Peak Flow --      Pain Score 02/09/21 2252 10     Pain Loc --      Pain Edu? --      Excl. in [place]? --     Constitutional: Alert and oriented, looks unwell but in no distress.  HEENT:      Head: Normocephalic and atraumatic.         Eyes: Conjunctivae are normal. Sclera is non-icteric.       Mouth/Throat: Mucous membranes are moist.       Neck: Supple with no signs of meningismus. Cardiovascular: Tachycardic with regular rhythm  respiratory: Normal respiratory effort.  Right lower lobe crackles and wheezing, clear on the left Gastrointestinal: Soft, non tender, and non distended with positive bowel sounds. No rebound or guarding. Musculoskeletal:  No edema, cyanosis, or erythema of extremities. Neurologic: Normal speech and language. Face is symmetric. Moving all extremities. No gross focal neurologic deficits are appreciated. Skin: Skin is warm, dry and intact. No rash noted. Psychiatric: Mood and affect are normal. Speech and behavior are normal.  ____________________________________________   LABS (all labs ordered are listed, but only abnormal results are displayed)  Labs Reviewed  BASIC METABOLIC PANEL - Abnormal; Notable for the following components:      Result Value   Glucose, Bld 191 (*)    All other components within normal limits  RESP PANEL BY RT-PCR (FLU A&B, COVID) ARPGX2  CBC WITH DIFFERENTIAL/PLATELET  TROPONIN I (HIGH SENSITIVITY)    ____________________________________________  EKG  none ____________________________________________  RADIOLOGY  I have personally reviewed the images performed during this visit and I agree with the Radiologist's read.   Interpretation by Radiologist:  DG Chest 2 View  Result Date: 02/09/2021 CLINICAL DATA:  Cough and fever. EXAM: CHEST - 2 VIEW COMPARISON:  01/22/2017 FINDINGS: The cardiomediastinal contours are normal. Central bronchial thickening. Pulmonary vasculature is normal. No consolidation, pleural effusion, or pneumothorax. No acute osseous abnormalities are seen. IMPRESSION: Central bronchial thickening without pneumonia. Electronically Signed   By: Keith Rake M.D.   On: 02/09/2021 23:35     ____________________________________________   PROCEDURES  Procedure(s) performed:yes .1-3 Lead EKG Interpretation  Date/Time: 02/09/2021 11:42 PM Performed by: Rudene Re, MD Authorized by: Rudene Re, MD     Interpretation: non-specific     ECG rate assessment: tachycardic  Rhythm: sinus tachycardia     Ectopy: none     Conduction: normal    Critical Care performed:  None ____________________________________________   INITIAL IMPRESSION / ASSESSMENT AND PLAN / ED COURSE  30 y.o. male with a history of asthma, diabetes, smoking who presents for evaluation of COVID-like symptoms x 3 days.  Patient actively coughing and wheezing on the right lower lobe with some crackles as well but clear on the left, slightly tachypneic but not hypoxic.  Patient is very congested.  Differential diagnoses including COVID, flu, pneumonia, myocarditis, pericarditis, viral syndrome.  Plan for EKG, chest x-ray, labs, COVID and flu swab.  Since patient has a history of asthma and is wheezing we will give him DuoNeb's and Solu-Medrol, will also give him fluids, Tylenol and Toradol for body aches and headache.  Patient placed on telemetry for close monitoring of his  cardiorespiratory status.  Old medical records reviewed   _________________________ 1:13 AM on 02/10/2021 ----------------------------------------- Patient feels improved, remains with normal work of breathing and normal sats both at rest and with ambulation with no signs of hypoxia.  Moving good air with resolved wheezing.  COVID and flu negative.  Chest x-ray visualized by me with no lobar pneumonia but due to localized findings on auscultation to the right lower lobe patient was started on azithromycin for possible early community-acquired pneumonia.  Will discharge home on Sudafed, Z-Pak, prednisone and albuterol.  Recommended follow-up with PCP and discussed my standard return precautions. Patient's BP during my re-evaluation was normal at 122/65. Unfortunately nurse forgot to document that BP before patient left. The last documented BP was while patient was asleep. EKG was pending and while patient's nurse and myself were in the room with another critical patient, a different nurse discharged the patient without getting the EKG.  With a normal troponin in the setting of 3 days of symptoms and symptoms markedly improved prior to dc, no clinical signs of myocarditis.  _____________________________________________ Please note:  Patient was evaluated in Emergency Department today for the symptoms described in the history of present illness. Patient was evaluated in the context of the global COVID-19 pandemic, which necessitated consideration that the patient might be at risk for infection with the SARS-CoV-2 virus that causes COVID-19. Institutional protocols and algorithms that pertain to the evaluation of patients at risk for COVID-19 are in a state of rapid change based on information released by regulatory bodies including the CDC and federal and state organizations. These policies and algorithms were followed during the patient's care in the ED.  Some ED evaluations and interventions may be delayed as  a result of limited staffing during the pandemic.   [place] Controlled Substance Database was reviewed by me. ____________________________________________   FINAL CLINICAL IMPRESSION(S) / ED DIAGNOSES   Final diagnoses:  Community acquired pneumonia of right lung, unspecified part of lung      NEW MEDICATIONS STARTED DURING THIS VISIT:  ED Discharge Orders          Ordered    azithromycin (ZITHROMAX) 250 MG tablet        02/10/21 0111    albuterol (VENTOLIN HFA) 108 (90 Base) MCG/ACT inhaler  Every 6 hours PRN        02/10/21 0111    predniSONE (DELTASONE) 20 MG tablet  Daily with breakfast        02/10/21 0111    pseudoephedrine (SUDAFED) 30 MG tablet  Every 6 hours PRN        02/10/21 0111  Note:  This document was prepared using Dragon voice recognition software and may include unintentional dictation errors.    Alfred Levins, [place], MD 02/10/21 [phone]

## 2021-02-09 NOTE — ED Notes (Signed)
Patient stated that this does not feel like a normal cold; denies being around anyone with covid and stated he has not had covid. Pt has had cough for 2 days and feels cold and hot.

## 2021-02-09 NOTE — ED Triage Notes (Signed)
Pt reports 2.5 days cough, sore throat, body aches. SOB. Two negative covid test at home

## 2021-02-10 ENCOUNTER — Other Ambulatory Visit: Payer: Self-pay

## 2021-02-10 LAB — BASIC METABOLIC PANEL
Anion gap: 7 (ref 5–15)
BUN: 14 mg/dL (ref 6–20)
CO2: 26 mmol/L (ref 22–32)
Calcium: 8.9 mg/dL (ref 8.9–10.3)
Chloride: 105 mmol/L (ref 98–111)
Creatinine, Ser: 0.86 mg/dL (ref 0.61–1.24)
GFR, Estimated: >60 mL/min (ref >60)
Glucose, Bld: 191 mg/dL — ABNORMAL HIGH (ref 70–99)
Potassium: 3.7 mmol/L (ref 3.5–5.1)
Sodium: 138 mmol/L (ref 135–145)

## 2021-02-10 LAB — RESP PANEL BY RT-PCR (FLU A&B, COVID) ARPGX2
Influenza A by PCR: NEGATIVE
Influenza B by PCR: NEGATIVE
SARS Coronavirus 2 by RT PCR: NEGATIVE

## 2021-02-10 LAB — TROPONIN I (HIGH SENSITIVITY): Troponin I (High Sensitivity): 3 ng/L (ref <18)

## 2021-02-10 MED ORDER — AZITHROMYCIN 500 MG PO TABS
500.0000 mg | ORAL_TABLET | Freq: Once | ORAL | Status: AC
  Administered 2021-02-10: 500 mg via ORAL
  Filled 2021-02-10: qty 1

## 2021-02-10 MED ORDER — OXYMETAZOLINE HCL 0.05 % NA SOLN
1.0000 | Freq: Once | NASAL | Status: AC
  Administered 2021-02-10: 1 via NASAL
  Filled 2021-02-10: qty 30

## 2021-02-10 MED ORDER — PREDNISONE 20 MG PO TABS: 60.0000 mg | ORAL_TABLET | Freq: Every day | ORAL | 0 refills | Status: AC

## 2021-02-10 MED ORDER — ALBUTEROL SULFATE HFA 108 (90 BASE) MCG/ACT IN AERS
INHALATION_SPRAY | RESPIRATORY_TRACT | 2 refills | Status: DC
  Filled 2021-02-10: qty 18, 25d supply, fill #0

## 2021-02-10 MED ORDER — AZITHROMYCIN 250 MG PO TABS: ORAL_TABLET | ORAL | 0 refills | Status: DC

## 2021-02-10 MED ORDER — PSEUDOEPHEDRINE HCL 30 MG PO TABS: 30.0000 mg | ORAL_TABLET | Freq: Four times a day (QID) | ORAL | 2 refills | Status: AC | PRN

## 2021-02-10 MED ORDER — PREDNISONE 20 MG PO TABS
ORAL_TABLET | ORAL | 0 refills | Status: DC
  Filled 2021-02-10: qty 12, 4d supply, fill #0

## 2021-02-10 MED ORDER — ALBUTEROL SULFATE HFA 108 (90 BASE) MCG/ACT IN AERS: 2.0000 | INHALATION_SPRAY | Freq: Four times a day (QID) | RESPIRATORY_TRACT | 2 refills | Status: AC | PRN

## 2021-02-10 MED ORDER — AZITHROMYCIN 250 MG PO TABS
ORAL_TABLET | ORAL | 0 refills | Status: DC
  Filled 2021-02-10: qty 4, 4d supply, fill #0

## 2021-02-10 NOTE — ED Notes (Signed)
When patient got up to ambulate his oxygen saturation stayed at 96 to 98 on room air

## 2021-03-03 ENCOUNTER — Ambulatory Visit: Payer: Medicaid Other | Admitting: Critical Care Medicine

## 2021-03-07 ENCOUNTER — Other Ambulatory Visit: Payer: Self-pay

## 2021-03-07 ENCOUNTER — Other Ambulatory Visit (HOSPITAL_BASED_OUTPATIENT_CLINIC_OR_DEPARTMENT_OTHER): Payer: Self-pay

## 2021-03-12 ENCOUNTER — Other Ambulatory Visit: Payer: Self-pay

## 2021-03-14 ENCOUNTER — Other Ambulatory Visit: Payer: Self-pay

## 2021-03-24 ENCOUNTER — Other Ambulatory Visit: Payer: Self-pay

## 2021-03-25 ENCOUNTER — Encounter: Payer: Self-pay | Admitting: Emergency Medicine

## 2021-03-25 ENCOUNTER — Other Ambulatory Visit: Payer: Self-pay

## 2021-03-25 ENCOUNTER — Emergency Department: Payer: Medicaid Other

## 2021-03-25 DIAGNOSIS — F17210 Nicotine dependence, cigarettes, uncomplicated: Secondary | ICD-10-CM | POA: Insufficient documentation

## 2021-03-25 DIAGNOSIS — Z794 Long term (current) use of insulin: Secondary | ICD-10-CM | POA: Insufficient documentation

## 2021-03-25 DIAGNOSIS — Z28310 Unvaccinated for covid-19: Secondary | ICD-10-CM | POA: Insufficient documentation

## 2021-03-25 DIAGNOSIS — Z79899 Other long term (current) drug therapy: Secondary | ICD-10-CM | POA: Insufficient documentation

## 2021-03-25 DIAGNOSIS — E119 Type 2 diabetes mellitus without complications: Secondary | ICD-10-CM | POA: Insufficient documentation

## 2021-03-25 DIAGNOSIS — U071 COVID-19: Secondary | ICD-10-CM | POA: Insufficient documentation

## 2021-03-25 DIAGNOSIS — J45909 Unspecified asthma, uncomplicated: Secondary | ICD-10-CM | POA: Insufficient documentation

## 2021-03-25 LAB — RESP PANEL BY RT-PCR (FLU A&B, COVID) ARPGX2
Influenza A by PCR: NEGATIVE
Influenza B by PCR: NEGATIVE
SARS Coronavirus 2 by RT PCR: POSITIVE — AB

## 2021-03-25 LAB — BASIC METABOLIC PANEL
Anion gap: 8 (ref 5–15)
BUN: 7 mg/dL (ref 6–20)
CO2: 26 mmol/L (ref 22–32)
Calcium: 8.9 mg/dL (ref 8.9–10.3)
Chloride: 104 mmol/L (ref 98–111)
Creatinine, Ser: 0.8 mg/dL (ref 0.61–1.24)
GFR, Estimated: >60 mL/min (ref >60)
Glucose, Bld: 202 mg/dL — ABNORMAL HIGH (ref 70–99)
Potassium: 3.3 mmol/L — ABNORMAL LOW (ref 3.5–5.1)
Sodium: 138 mmol/L (ref 135–145)

## 2021-03-25 LAB — CBC
HCT: 37.9 % — ABNORMAL LOW (ref 39.0–52.0)
Hemoglobin: 13.8 g/dL (ref 13.0–17.0)
MCH: 32.6 pg (ref 26.0–34.0)
MCHC: 36.4 g/dL — ABNORMAL HIGH (ref 30.0–36.0)
MCV: 89.6 fL (ref 80.0–100.0)
Platelets: 170 10*3/uL (ref 150–400)
RBC: 4.23 MIL/uL (ref 4.22–5.81)
RDW: 12 % (ref 11.5–15.5)
WBC: 4.8 10*3/uL (ref 4.0–10.5)
nRBC: 0 % (ref 0.0–0.2)

## 2021-03-25 LAB — TROPONIN I (HIGH SENSITIVITY): Troponin I (High Sensitivity): 4 ng/L (ref <18)

## 2021-03-25 MED ORDER — ACETAMINOPHEN 500 MG PO TABS
1000.0000 mg | ORAL_TABLET | Freq: Once | ORAL | Status: AC
  Administered 2021-03-25: 1000 mg via ORAL
  Filled 2021-03-25: qty 2

## 2021-03-25 NOTE — ED Triage Notes (Signed)
Pt reports that he developed fever, chills, generalized body aches starting Sunday night. Pt is also complaining of chest pain, especially when he coughs trying to get up his mucous.

## 2021-03-26 ENCOUNTER — Other Ambulatory Visit: Payer: Self-pay

## 2021-03-26 ENCOUNTER — Emergency Department
Admission: EM | Admit: 2021-03-26 | Discharge: 2021-03-26 | Disposition: A | Discharge status: Home / Self Care | Payer: Medicaid Other | Attending: Emergency Medicine | Admitting: Emergency Medicine

## 2021-03-26 DIAGNOSIS — U071 COVID-19: Secondary | ICD-10-CM

## 2021-03-26 LAB — TROPONIN I (HIGH SENSITIVITY): Troponin I (High Sensitivity): 3 ng/L (ref <18)

## 2021-03-26 LAB — D-DIMER, QUANTITATIVE: D-Dimer, Quant: <0.27 ug/mL-FEU (ref 0.00–0.50)

## 2021-03-26 MED ORDER — ONDANSETRON 4 MG PO TBDP
4.0000 mg | ORAL_TABLET | Freq: Three times a day (TID) | ORAL | 0 refills | Status: DC | PRN
Start: 2021-03-26 — End: 2022-07-10
  Filled 2021-03-26: qty 20, 7d supply, fill #0

## 2021-03-26 MED ORDER — NIRMATRELVIR&RITONAVIR 300/100 20 X 150 MG & 10 X 100MG PO TBPK
3.0000 | ORAL_TABLET | Freq: Two times a day (BID) | ORAL | 0 refills | Status: AC
  Filled 2021-03-26: qty 30, 5d supply, fill #0

## 2021-03-26 NOTE — Discharge Instructions (Signed)
To help boost your immune system against COVID-19, please take:  - Vitamin D3 4,000 IU/day - Vitamin C 500-1,000?mg twice a day - Quercetin 250?mg twice a day - Zinc 100?mg/day - Melatonin 10?mg before bedtime (causes drowsiness) - Aspirin 325?mg/day (unless contraindicated) - Pulse Oximeter Monitoring of oxygen saturation is recommended - check your oxygen 3 times a day if less than 90% return to the ER  These medications are all over-the-counter and do not need a prescription.  QUARANTINE INSTRUCTION  Follow these instructions at home:  Protecting others To avoid spreading the illness to other people: Quarantine in your home for 5 days Household members: With no symptoms: who are vaccinated do not need to quarantine but are strongly encouraged to use masks to prevent infection.  With symptoms: should get tested and if positive should quarantine for 5 days. Wash your hands often with soap and water. If soap and water are not available, use an alcohol-based hand sanitizer. If you have not cleaned your hands, do not touch your face. Make sure that all people in your household wash their hands well and often. Cover your nose and mouth when you cough or sneeze. Throw away used tissues. Stay home if you have any cold-like or flu-like symptoms. General instructions Go to your local pharmacy and buy a pulse oximeter (this is a machine that measures your oxygen). Check your oxygen levels at least 3 times a day. If your oxygen level is 90% or less return to the emergency room immediately Take over-the-counter and prescription medicines only as told by your health care provider. If you need medication for fever take tylenol or ibuprofen Drink enough fluid to keep your urine pale yellow. Rest at home as directed by your health care provider. Do not give aspirin to a child with the flu, because of the association with Reye's syndrome. Do not use tobacco products, including cigarettes, chewing  tobacco, and e-cigarettes. If you need help quitting, ask your health care provider. Keep all follow-up visits as told by your health care provider. This is important. How is this prevented? Avoid areas where an outbreak has been reported. Avoid large groups of people. Keep a safe distance from people who are coughing and sneezing. Do not touch your face if you have not cleaned your hands. When you are around people who are sick or might be sick, wear a mask to protect yourself. Contact a health care provider if: You have symptoms of SARS (cough, fever, chest pain, shortness of breath) that are not getting better at home. You have a fever. If you have difficulty breathing go to your local ER or call 911   

## 2021-03-26 NOTE — ED Provider Notes (Signed)
[facility] Emergency Department Provider Note  ____________________________________________  Time seen: Approximately 3:01 AM  I have reviewed the triage vital signs and the nursing notes.   HISTORY  Chief Complaint Chills, Generalized Body Aches, and Chest Pain   HPI Jerome Murphy is a 30 y.o. male with a history of asthma and diabetes who presents for evaluation of chest pain, fever and body aches.  Patient reports that his symptoms have been ongoing for 3 days.  He is complaining of generalized body aches, chills, fever, and a dry cough.  He is complaining of left-sided sharp chest pain that is present when he coughs only.  He denies shortness of breath.  He denies any personal or family history of blood clots, recent travel immobilization, leg pain or swelling, hemoptysis, or exogenous hormones.  He is unvaccinated against COVID.  He denies any known exposures.  Past Medical History:  Diagnosis Date   Asthma    Diabetes mellitus without complication ([place])    History of gallstones    Narcolepsy    Seizures ([place])     Patient Active Problem List   Diagnosis Date Noted   Neuropathic pain 11/26/2020   Vitamin D deficiency 11/26/2020   Other fatigue 08/29/2020   Mixed hyperlipidemia 10/24/2019   Tobacco dependence 08/29/2019   Type 2 diabetes mellitus ([place]) 08/29/2019    Past Surgical History:  Procedure Laterality Date   CIRCUMCISION     HERNIA REPAIR     TONSILLECTOMY      Prior to Admission medications   Medication Sig Start Date End Date Taking? Authorizing Provider  nirmatrelvir/ritonavir EUA (PAXLOVID) 20 x 150 MG & 10 x 100MG TABS Take 3 tablets by mouth 2 (two) times daily for 5 days. Patient GFR is >60 Take nirmatrelvir (150 mg) two tablets twice daily for 5 days and ritonavir (100 mg) one tablet twice daily for 5 days. 03/26/21 03/31/21 Yes Kiyan Burmester, [place], MD  ondansetron (ZOFRAN ODT) 4 MG disintegrating tablet Take 1  tablet (4 mg total) by mouth every 8 (eight) hours as needed. 03/26/21  Yes Alfred Levins, [place], MD  albuterol (VENTOLIN HFA) 108 (90 Base) MCG/ACT inhaler Inhale 2 puffs into the lungs every 6 (six) hours as needed for wheezing or shortness of breath. 02/10/21   Rudene Re, MD  albuterol (VENTOLIN HFA) 108 (90 Base) MCG/ACT inhaler Inhale 2 puffs by mouth into the lungs every 6 hours as needed for wheezing or shortness of breath. 02/10/21   Rudene Re, MD  atorvastatin (LIPITOR) 20 MG tablet Take 1 tablet (20 mg total) by mouth daily. 01/16/21   Elsie Stain, MD  azithromycin (ZITHROMAX) 250 MG tablet Take 1 a day for 4 days 02/10/21   Alfred Levins, [place], MD  azithromycin (ZITHROMAX) 250 MG tablet Take 1 tablet by mouth daily for 4 days. 02/10/21   Rudene Re, MD  Blood Glucose Monitoring Suppl (TRUE METRIX METER) w/Device KIT Use to measure blood sugar twice a day 01/16/21   Elsie Stain, MD  glucose blood (TRUE METRIX BLOOD GLUCOSE TEST) test strip Use as instructed 01/16/21   Elsie Stain, MD  Insulin NPH, Human,, Isophane, (HUMULIN N) 100 UNIT/ML Kiwkpen Inject 12 Units into the skin in the morning and at bedtime. 01/16/21 01/16/22  Elsie Stain, MD  Insulin Pen Needle 32G X 4 MM MISC 1 application by Does not apply route 2 (two) times daily. 08/28/20   Mayers, Cari S, PA-C  metFORMIN (GLUCOPHAGE XR) 750 MG 24  hr tablet Take 1 tablet (750 mg total) by mouth in the morning and at bedtime. 01/16/21   Elsie Stain, MD  predniSONE (DELTASONE) 20 MG tablet Take 3 tablets by  mouth daily with breakfast for 4 days. 02/10/21   Rudene Re, MD  pseudoephedrine (SUDAFED) 30 MG tablet Take 1 tablet (30 mg total) by mouth every 6 (six) hours as needed for congestion. 02/10/21 02/10/22  Rudene Re, MD  TRUEplus Lancets 28G MISC Use to measure blood sugar twice a day 01/16/21   Elsie Stain, MD  Vitamin D, Ergocalciferol, (DRISDOL) 1.25 MG (50000 UNIT) CAPS capsule Take 1  capsule (50,000 Units total) by mouth every 7 (seven) days. 01/16/21 01/16/22  Elsie Stain, MD    Allergies Robitussin [guaifenesin]  Family History  Problem Relation Age of Onset   Diabetes Mother    Diabetes Maternal Uncle     Social History Social History   Tobacco Use   Smoking status: Every Day    Packs/day: 0.25    Types: Cigarettes   Smokeless tobacco: Never  Vaping Use   Vaping Use: Never used  Substance Use Topics   Alcohol use: No    Comment: occasionallly   Drug use: No    Review of Systems  Constitutional: +fever, chills, body aches Eyes: Negative for visual changes. ENT: Negative for sore throat. Neck: No neck pain  Cardiovascular: + chest pain. Respiratory: Negative for shortness of breath. + cough Gastrointestinal: Negative for abdominal pain, vomiting or diarrhea. Genitourinary: Negative for dysuria. Musculoskeletal: Negative for back pain. Skin: Negative for rash. Neurological: Negative for headaches, weakness or numbness. Psych: No SI or HI  ____________________________________________   PHYSICAL EXAM:  VITAL SIGNS: ED Triage Vitals  Enc Vitals Group     BP 03/25/21 2201 116/72     Pulse Rate 03/25/21 2201 87     Resp 03/25/21 2201 18     Temp 03/25/21 2201 97.8 F (36.6 C)     Temp Source 03/25/21 2201 Oral     SpO2 03/25/21 2201 98 %     Weight 03/25/21 2202 186 lb 15.2 oz (84.8 kg)     Height 03/25/21 2202 6' (1.829 m)     Head Circumference --      Peak Flow --      Pain Score 03/25/21 2202 10     Pain Loc --      Pain Edu? --      Excl. in [place]? --     Constitutional: Alert and oriented. Well appearing and in no apparent distress. HEENT:      Head: Normocephalic and atraumatic.         Eyes: Conjunctivae are normal. Sclera is non-icteric.       Mouth/Throat: Mucous membranes are moist.       Neck: Supple with no signs of meningismus. Cardiovascular: Regular rate and rhythm. No murmurs, gallops, or rubs. 2+ symmetrical  distal pulses are present in all extremities. No JVD. Respiratory: Normal respiratory effort. Lungs are clear to auscultation bilaterally.  Gastrointestinal: Soft, non tender, and non distended with positive bowel sounds. No rebound or guarding. Genitourinary: No CVA tenderness. Musculoskeletal:  No edema, cyanosis, or erythema of extremities. Neurologic: Normal speech and language. Face is symmetric. Moving all extremities. No gross focal neurologic deficits are appreciated. Skin: Skin is warm, dry and intact. No rash noted. Psychiatric: Mood and affect are normal. Speech and behavior are normal.  ____________________________________________   LABS (all labs ordered are listed,  but only abnormal results are displayed)  Labs Reviewed  RESP PANEL BY RT-PCR (FLU A&B, COVID) ARPGX2 - Abnormal; Notable for the following components:      Result Value   SARS Coronavirus 2 by RT PCR POSITIVE (*)    All other components within normal limits  BASIC METABOLIC PANEL - Abnormal; Notable for the following components:   Potassium 3.3 (*)    Glucose, Bld 202 (*)    All other components within normal limits  CBC - Abnormal; Notable for the following components:   HCT 37.9 (*)    MCHC 36.4 (*)    All other components within normal limits  D-DIMER, QUANTITATIVE  TROPONIN I (HIGH SENSITIVITY)  TROPONIN I (HIGH SENSITIVITY)   ____________________________________________  EKG  ED ECG REPORT I, Rudene Re, the attending physician, personally viewed and interpreted this ECG.  Sinus rhythm with a rate of 79, normal intervals, normal axis, no ST elevations or depressions ____________________________________________  RADIOLOGY  I have personally reviewed the images performed during this visit and I agree with the Radiologist's read.   Interpretation by Radiologist:  DG Chest 2 View  Result Date: 03/25/2021 CLINICAL DATA:  Chest pain.  Chills. EXAM: CHEST - 2 VIEW COMPARISON:  Chest  x-ray 02/09/2021 FINDINGS: The heart and mediastinal contours are within normal limits. No focal consolidation. No pulmonary edema. No pleural effusion. No pneumothorax. No acute osseous abnormality. IMPRESSION: No active cardiopulmonary disease. Electronically Signed   By: Iven Finn M.D.   On: 03/25/2021 22:32     ____________________________________________   PROCEDURES  Procedure(s) performed:yes .1-3 Lead EKG Interpretation Performed by: Rudene Re, MD Authorized by: Rudene Re, MD     Interpretation: non-specific     ECG rate assessment: normal     Rhythm: sinus rhythm     Ectopy: none     Conduction: normal     Critical Care performed:  None ____________________________________________   INITIAL IMPRESSION / ASSESSMENT AND PLAN / ED COURSE  30 y.o. male with a history of asthma and diabetes who presents for evaluation of chest pain, fever and body aches x 3 days and now with sharp pleuritic left-sided chest pain.  Patient is well-appearing in no distress with normal vital signs, normal work of breathing and normal sats, lungs are clear to auscultation with good air movement bilaterally.  EKG with no signs of ischemia or dysrhythmias.  Patient is COVID-positive therefore a D-dimer was done to rule out PE and that is negative.  Chest x-ray visualized by me with no signs of pneumonia.  2 high-sensitivity troponins are negative with no signs of myocarditis.  Blood glucose of 202 with no signs of DKA.  No signs of sepsis.  Discussed quarantine, immune system boosting, Paxlovid risks and benefits, O2 monitoring at home, and follow-up with primary care doctor.  Discussed my standard return precautions for worsening chest pain, shortness of breath, hypoxia.  Old medical records review      _____________________________________________ Please note:  Patient was evaluated in Emergency Department today for the symptoms described in the history of present illness.  Patient was evaluated in the context of the global COVID-19 pandemic, which necessitated consideration that the patient might be at risk for infection with the SARS-CoV-2 virus that causes COVID-19. Institutional protocols and algorithms that pertain to the evaluation of patients at risk for COVID-19 are in a state of rapid change based on information released by regulatory bodies including the CDC and federal and state organizations. These policies and algorithms  were followed during the patient's care in the ED.  Some ED evaluations and interventions may be delayed as a result of limited staffing during the pandemic.   [place] Controlled Substance Database was reviewed by me. ____________________________________________   FINAL CLINICAL IMPRESSION(S) / ED DIAGNOSES   Final diagnoses:  COVID-19      NEW MEDICATIONS STARTED DURING THIS VISIT:  ED Discharge Orders          Ordered    ondansetron (ZOFRAN ODT) 4 MG disintegrating tablet  Every 8 hours PRN        03/26/21 0306    nirmatrelvir/ritonavir EUA (PAXLOVID) 20 x 150 MG & 10 x 100MG TABS  2 times daily        03/26/21 7473             Note:  This document was prepared using Dragon voice recognition software and may include unintentional dictation errors.    Alfred Levins, [place], MD 03/26/21 [id number]

## 2021-04-02 ENCOUNTER — Other Ambulatory Visit: Payer: Self-pay

## 2021-06-02 ENCOUNTER — Other Ambulatory Visit: Payer: Self-pay

## 2021-06-04 ENCOUNTER — Other Ambulatory Visit: Payer: Self-pay

## 2021-06-26 ENCOUNTER — Other Ambulatory Visit: Payer: Self-pay

## 2021-06-26 ENCOUNTER — Ambulatory Visit: Payer: Self-pay | Admitting: Physician Assistant

## 2021-06-26 ENCOUNTER — Ambulatory Visit: Payer: Self-pay

## 2021-06-26 VITALS — BP 128/82 | HR 68 | Temp 98.0°F | Resp 18 | Ht 71.0 in | Wt 185.0 lb

## 2021-06-26 DIAGNOSIS — Z794 Long term (current) use of insulin: Secondary | ICD-10-CM

## 2021-06-26 DIAGNOSIS — E1165 Type 2 diabetes mellitus with hyperglycemia: Secondary | ICD-10-CM

## 2021-06-26 DIAGNOSIS — M792 Neuralgia and neuritis, unspecified: Secondary | ICD-10-CM

## 2021-06-26 LAB — POCT GLYCOSYLATED HEMOGLOBIN (HGB A1C): Hemoglobin A1C: 13.9 % — AB (ref 4.0–5.6)

## 2021-06-26 MED ORDER — GABAPENTIN 300 MG PO CAPS
300.0000 mg | ORAL_CAPSULE | Freq: Every day | ORAL | 1 refills | Status: DC
Start: 2021-06-26 — End: 2022-07-10
  Filled 2021-06-26: qty 30, 30d supply, fill #0

## 2021-06-26 NOTE — Telephone Encounter (Signed)
°  Chief Complaint: numbness in fingertips Symptoms: numbness in bilat fingertips, thumbs are worse Frequency: 1 day Pertinent Negatives: Patient denies numbness in arms or anywhere else Disposition: [] ED /[] Urgent Care (no appt availability in office) / [] Appointment(In office/virtual)/ [x]  Mobile Unit/ []  [facility]/ [] Home Care/ [] Refused Recommended Disposition  Additional Notes: Due to no appts in office and pt wanting to come in, pt was given address and location of mobile unit for today. Pt states he will be heading there now. He states that yesterday morning his fingers were normal but the evening he noticed his fingertips were numb and his thumbs are worse. He woke up this morning and the numbness is still there so pt wanted to get checked out.     Reason for Disposition  [1] Numbness or tingling on both sides of body AND [2] is a new symptom present > 24 hours  Answer Assessment - Initial Assessment Questions 1. SYMPTOM: "What is the main symptom you are concerned about?" (e.g., weakness, numbness)     Numbness in fingertips both hands 2. ONSET: "When did this start?" (minutes, hours, days; while sleeping)     Yesterday evening 3. LAST NORMAL: "When was the last time you (the patient) were normal (no symptoms)?"     Yesterday morning 4. PATTERN "Does this come and go, or has it been constant since it started?"  "Is it present now?"     constant 5. CARDIAC SYMPTOMS: "Have you had any of the following symptoms: chest pain, difficulty breathing, palpitations?"     No 6. NEUROLOGIC SYMPTOMS: "Have you had any of the following symptoms: headache, dizziness, vision loss, double vision, changes in speech, unsteady on your feet?"     NO 7. OTHER SYMPTOMS: "Do you have any other symptoms?"     tingling  Protocols used: Neurologic Deficit-A-AH

## 2021-06-26 NOTE — Progress Notes (Signed)
Established Patient Office Visit  Subjective:  Patient ID: Jerome Murphy, male    DOB: 31-Jan-1991  Age: 30 y.o. MRN: [id number]  CC:  Chief Complaint  Patient presents with   Hand Pain     HPI Jerome Murphy states that he has started having pain in all of his fingertips again, states that it feels it is burning, and numb.  States that he did not do a trial of gabapentin.  States that he is only using metformin once a day and insulin injection 15 units once a day.  States that he has been checking his blood glucose levels at home, states that they are "good", never over 200, but does endorse that he is not checking it very often.  States that he is working hard on lifestyle modifications and has had some weight loss.     Past Medical History:  Diagnosis Date   Asthma    Diabetes mellitus without complication ([place])    History of gallstones    Narcolepsy    Seizures ([place])     Past Surgical History:  Procedure Laterality Date   CIRCUMCISION     HERNIA REPAIR     TONSILLECTOMY      Family History  Problem Relation Age of Onset   Diabetes Mother    Diabetes Maternal Uncle     Social History   Socioeconomic History   Marital status: Single    Spouse name: Not on file   Number of children: 2   Years of education: Not on file   Highest education level: Associate degree: occupational, Hotel manager, or vocational program  Occupational History   Occupation: Barbershop  Tobacco Use   Smoking status: Every Day    Packs/day: 0.25    Types: Cigarettes   Smokeless tobacco: Never  Vaping Use   Vaping Use: Never used  Substance and Sexual Activity   Alcohol use: No    Comment: occasionallly   Drug use: No   Sexual activity: Yes    Birth control/protection: Condom    Comment: Partner uses Depo  Other Topics Concern   Not on file  Social History Narrative   Not on file   Social Determinants of Health   Financial Resource Strain: Not on file   Food Insecurity: Not on file  Transportation Needs: Not on file  Physical Activity: Not on file  Stress: Not on file  Social Connections: Not on file  Intimate Partner Violence: Not on file    Outpatient Medications Prior to Visit  Medication Sig Dispense Refill   Blood Glucose Monitoring Suppl (TRUE METRIX METER) w/Device KIT Use to measure blood sugar twice a day 1 kit 0   glucose blood (TRUE METRIX BLOOD GLUCOSE TEST) test strip Use as instructed 100 each 12   Insulin NPH, Human,, Isophane, (HUMULIN N) 100 UNIT/ML Kiwkpen Inject 12 Units into the skin in the morning and at bedtime. 15 mL 1   Insulin Pen Needle 32G X 4 MM MISC 1 application by Does not apply route 2 (two) times daily. 60 each 11   metFORMIN (GLUCOPHAGE XR) 750 MG 24 hr tablet Take 1 tablet (750 mg total) by mouth in the morning and at bedtime. 180 tablet 0   ondansetron (ZOFRAN ODT) 4 MG disintegrating tablet Take 1 tablet (4 mg total) by mouth every 8 (eight) hours as needed. 20 tablet 0   albuterol (VENTOLIN HFA) 108 (90 Base) MCG/ACT inhaler Inhale 2 puffs into the lungs every 6 (six)  hours as needed for wheezing or shortness of breath. (Patient not taking: Reported on 06/26/2021) 8 g 2   albuterol (VENTOLIN HFA) 108 (90 Base) MCG/ACT inhaler Inhale 2 puffs by mouth into the lungs every 6 hours as needed for wheezing or shortness of breath. (Patient not taking: Reported on 06/26/2021) 18 g 2   atorvastatin (LIPITOR) 20 MG tablet Take 1 tablet (20 mg total) by mouth daily. (Patient not taking: Reported on 06/26/2021) 90 tablet 3   predniSONE (DELTASONE) 20 MG tablet Take 3 tablets by  mouth daily with breakfast for 4 days. 12 tablet 0   pseudoephedrine (SUDAFED) 30 MG tablet Take 1 tablet (30 mg total) by mouth every 6 (six) hours as needed for congestion. 24 tablet 2   TRUEplus Lancets 28G MISC Use to measure blood sugar twice a day 100 each 1   Vitamin D, Ergocalciferol, (DRISDOL) 1.25 MG (50000 UNIT) CAPS capsule Take  1 capsule (50,000 Units total) by mouth every 7 (seven) days. 4 capsule 2   azithromycin (ZITHROMAX) 250 MG tablet Take 1 a day for 4 days 4 each 0   azithromycin (ZITHROMAX) 250 MG tablet Take 1 tablet by mouth daily for 4 days. 4 tablet 0   No facility-administered medications prior to visit.    Allergies  Allergen Reactions   Robitussin [Guaifenesin] Hives and Itching    ROS Review of Systems  Constitutional: Negative.   HENT: Negative.    Eyes: Negative.   Respiratory:  Negative for shortness of breath.   Cardiovascular:  Negative for chest pain.  Gastrointestinal: Negative.   Endocrine: Negative.   Genitourinary: Negative.   Musculoskeletal: Negative.   Skin: Negative.   Allergic/Immunologic: Negative.   Neurological:  Positive for numbness.  Hematological: Negative.   Psychiatric/Behavioral: Negative.       Objective:    Physical Exam Vitals and nursing note reviewed.  Constitutional:      General: He is not in acute distress.    Appearance: Normal appearance. He is not ill-appearing.  HENT:     Head: Normocephalic and atraumatic.     Right Ear: External ear normal.     Left Ear: External ear normal.     Nose: Nose normal.     Mouth/Throat:     Mouth: Mucous membranes are moist.     Pharynx: Oropharynx is clear.  Eyes:     Extraocular Movements: Extraocular movements intact.     Conjunctiva/sclera: Conjunctivae normal.     Pupils: Pupils are equal, round, and reactive to light.  Cardiovascular:     Rate and Rhythm: Normal rate and regular rhythm.     Pulses: Normal pulses.     Heart sounds: Normal heart sounds.  Pulmonary:     Effort: Pulmonary effort is normal.     Breath sounds: Normal breath sounds.  Musculoskeletal:        General: Normal range of motion.     Cervical back: Normal range of motion and neck supple.  Skin:    General: Skin is warm and dry.  Neurological:     General: No focal deficit present.     Mental Status: He is alert and  oriented to person, place, and time.  Psychiatric:        Mood and Affect: Mood normal.        Behavior: Behavior normal.        Thought Content: Thought content normal.        Judgment: Judgment normal.    BP  128/82 (BP Location: Left Arm, Patient Position: Sitting, Cuff Size: Normal)    Pulse 68    Temp 98 F (36.7 C) (Oral)    Resp 18    Ht 5' 11"  (1.803 m)    Wt 185 lb (83.9 kg)    SpO2 100%    BMI 25.80 kg/m  Wt Readings from Last 3 Encounters:  06/26/21 185 lb (83.9 kg)  03/25/21 186 lb 15.2 oz (84.8 kg)  11/26/20 187 lb (84.8 kg)     Health Maintenance Due  Topic Date Due   Pneumococcal Vaccine 34-22 Years old (1 - PCV) Never done   OPHTHALMOLOGY EXAM  Never done   Hepatitis C Screening  Never done   TETANUS/TDAP  Never done   INFLUENZA VACCINE  Never done    There are no preventive care reminders to display for this patient.  Lab Results  Component Value Date   TSH 1.130 08/28/2020   Lab Results  Component Value Date   WBC 4.8 03/25/2021   HGB 13.8 03/25/2021   HCT 37.9 (L) 03/25/2021   MCV 89.6 03/25/2021   PLT 170 03/25/2021   Lab Results  Component Value Date   NA 138 03/25/2021   K 3.3 (L) 03/25/2021   CO2 26 03/25/2021   GLUCOSE 202 (H) 03/25/2021   BUN 7 03/25/2021   CREATININE 0.80 03/25/2021   BILITOT 0.4 08/28/2020   ALKPHOS 86 08/28/2020   AST 9 08/28/2020   ALT 11 08/16/2020   PROT 6.9 08/28/2020   ALBUMIN 4.4 08/28/2020   CALCIUM 8.9 03/25/2021   ANIONGAP 8 03/25/2021   Lab Results  Component Value Date   CHOL 132 08/28/2020   Lab Results  Component Value Date   HDL 37 (L) 08/28/2020   Lab Results  Component Value Date   LDLCALC 82 08/28/2020   Lab Results  Component Value Date   TRIG 63 08/28/2020   Lab Results  Component Value Date   CHOLHDL 3.6 08/28/2020   Lab Results  Component Value Date   HGBA1C 13.9 (A) 06/26/2021      Assessment & Plan:   Problem List Items Addressed This Visit       Endocrine    Type 2 diabetes mellitus ([place]) - Primary   Relevant Medications   gabapentin (NEURONTIN) 300 MG capsule   Other Relevant Orders   HgB A1c (Completed)     Other   Neuropathic pain    Meds ordered this encounter  Medications   gabapentin (NEURONTIN) 300 MG capsule    Sig: Take 1 capsule (300 mg total) by mouth at bedtime.    Dispense:  30 capsule    Refill:  1    Order Specific Question:   Supervising Provider    Answer:   WRIGHT, PATRICK E [[id number]]   1. Type 2 diabetes mellitus with hyperglycemia, with long-term current use of insulin (H[facility]) A1c increased from 8.2 7 months ago to 13.9.Marland Kitchen  Patient is not taking medication properly, gave patient written instructions on proper dosing of medication.  Patient is will return to mobile unit in 5 days for fasting labs and further follow-up. Red flags given for prompt reevaluation.  Patient encouraged to check blood glucose levels at home on a daily basis, keep a written log and have available for all office visits   - HgB A1c - gabapentin (NEURONTIN) 300 MG capsule; Take 1 capsule (300 mg total) by mouth at bedtime.  Dispense: 30 capsule; Refill: 1  2. Neuropathic  pain Trial gabapentin.   I have reviewed the patient's medical history (PMH, PSH, Social History, Family History, Medications, and allergies) , and have been updated if relevant. I spent 30 minutes reviewing chart and  face to face time with patient.    Follow-up: Return in about 1 week (around 07/03/2021).    Loraine Grip Mayers, PA-C

## 2021-06-26 NOTE — Patient Instructions (Signed)
Please come back to [facility] next week for fasting labs and follow up  You will start taking gabapentin once a day at bedtime   Insulin 12 units twice a day Metformin twice a day   Roney Jaffe, PA-C Physician Assistant [facility] [email or web address]   Neuropathic Pain Neuropathic pain is pain caused by damage to the nerves that are responsible for certain sensations in your body (sensory nerves). Neuropathic pain can make you more sensitive to pain. Even a minor sensation can feel very painful. This is usually a long-term (chronic) condition that can be difficult to treat. The type of pain differs from person to person. It may: Start suddenly (acute), or it may develop slowly and become chronic. Come and go as damaged nerves heal, or it may stay at the same level for years. Cause emotional distress, loss of sleep, and a lower quality of life. What are the causes? The most common cause of this condition is diabetes. Many other diseases and conditions can also cause neuropathic pain. Causes of neuropathic pain can be classified as: Toxic. This is caused by medicines and chemicals. The most common causes of toxic neuropathic pain is damage from medicines that kill cancer cells (chemotherapy) or alcohol abuse. Metabolic. This can be caused by: Diabetes. Lack of vitamins like B12. Traumatic. Any injury that cuts, crushes, or stretches a nerve can cause damage and pain. Compression-related. If a sensory nerve gets trapped or compressed for a long period of time, the blood supply to the nerve can be cut off. Vascular. Many blood vessel diseases can cause neuropathic pain by decreasing blood supply and oxygen to nerves. Autoimmune. This type of pain results from diseases in which the body's defense system (immune system) mistakenly attacks sensory nerves. Examples of autoimmune diseases that can cause neuropathic  pain include lupus and multiple sclerosis. Infectious. Many types of viral infections can damage sensory nerves and cause pain. Shingles infection is a common cause of this type of pain. Inherited. Neuropathic pain can be a symptom of many diseases that are passed down through families (genetic). What increases the risk? You are more likely to develop this condition if: You have diabetes. You smoke. You drink too much alcohol. You are taking certain medicines, including chemotherapy or medicines that treat immune system disorders. What are the signs or symptoms? The main symptom is pain. Neuropathic pain is often described as: Burning. Shock-like. Stinging. Hot or cold. Itching. How is this diagnosed? No single test can diagnose neuropathic pain. It is diagnosed based on: A physical exam and your symptoms. Your health care provider will ask you about your pain. You may be asked to use a pain scale to describe how bad your pain is. Tests. These may be done to see if you have a cause and location of any nerve damage. They include: Nerve conduction studies and electromyography to test how well nerve signals travel through your nerves and muscles (electrodiagnostic testing). Skin biopsy to evaluate for small fiber neuropathy. Imaging studies, such as: X-rays. CT scan. MRI. How is this treated? Treatment for neuropathic pain may change over time. You may need to try different treatment options or a combination of treatments. Some options include: Treating the underlying cause of the neuropathy, such as diabetes, kidney disease, or vitamin deficiencies. Stopping medicines that can cause neuropathy, such as chemotherapy. Medicine to relieve pain. Medicines may include: Prescription or over-the-counter pain medicine. Anti-seizure medicine. Antidepressant medicines. Pain-relieving patches or creams that  are applied to painful areas of skin. A medicine to numb the area (local anesthetic),  which can be injected as a nerve block. Transcutaneous nerve stimulation. This uses electrical currents to block painful nerve signals. The treatment is painless. Alternative treatments, such as: Acupuncture. Meditation. Massage. Occupational or physical therapy. Pain management programs. Counseling. Follow these instructions at home: Medicines  Take over-the-counter and prescription medicines only as told by your health care provider. Ask your health care provider if the medicine prescribed to you: Requires you to avoid driving or using machinery. Can cause constipation. You may need to take these actions to prevent or treat constipation: Drink enough fluid to keep your urine pale yellow. Take over-the-counter or prescription medicines. Eat foods that are high in fiber, such as beans, whole grains, and fresh fruits and vegetables. Limit foods that are high in fat and processed sugars, such as fried or sweet foods. Lifestyle  Have a good support system at home. Consider joining a chronic pain support group. Do not use any products that contain nicotine or tobacco. These products include cigarettes, chewing tobacco, and vaping devices, such as e-cigarettes. If you need help quitting, ask your health care provider. Do not drink alcohol. General instructions Learn as much as you can about your condition. Work closely with all your health care providers to find the treatment plan that works best for you. Ask your health care provider what activities are safe for you. Keep all follow-up visits. This is important. Contact a health care provider if: Your pain treatments are not working. You are having side effects from your medicines. You are struggling with tiredness (fatigue), mood changes, depression, or anxiety. Get help right away if: You have thoughts of hurting yourself. Get help right away if you feel like you may hurt yourself or others, or have thoughts about taking your own  life. Go to your nearest emergency room or: Call 911. Call the National Suicide Prevention Lifeline at 580-475-2717 or 988. This is open 24 hours a day. Text the Crisis Text Line at 339-673-2930. Summary Neuropathic pain is pain caused by damage to the nerves that are responsible for certain sensations in your body (sensory nerves). Neuropathic pain may come and go as damaged nerves heal, or it may stay at the same level for years. Neuropathic pain is usually a long-term condition that can be difficult to treat. Consider joining a chronic pain support group. This information is not intended to replace advice given to you by your health care provider. Make sure you discuss any questions you have with your health care provider. Document Revised: 02/24/2021 Document Reviewed: 02/24/2021 Elsevier Patient Education  2022 [employer].

## 2021-06-27 ENCOUNTER — Other Ambulatory Visit: Payer: Self-pay

## 2021-06-27 ENCOUNTER — Other Ambulatory Visit: Payer: Self-pay | Admitting: Critical Care Medicine

## 2021-06-30 ENCOUNTER — Other Ambulatory Visit: Payer: Self-pay

## 2021-06-30 MED ORDER — INSULIN PEN NEEDLE 32G X 4 MM MISC
11 refills | Status: DC
Start: 2021-06-30 — End: 2021-08-14
  Filled 2021-06-30: qty 100, 50d supply, fill #0

## 2021-07-01 ENCOUNTER — Other Ambulatory Visit: Payer: Self-pay | Admitting: Physician Assistant

## 2021-07-01 DIAGNOSIS — Z1159 Encounter for screening for other viral diseases: Secondary | ICD-10-CM

## 2021-07-01 DIAGNOSIS — E782 Mixed hyperlipidemia: Secondary | ICD-10-CM

## 2021-07-01 DIAGNOSIS — E559 Vitamin D deficiency, unspecified: Secondary | ICD-10-CM

## 2021-07-01 DIAGNOSIS — E1165 Type 2 diabetes mellitus with hyperglycemia: Secondary | ICD-10-CM

## 2021-07-03 ENCOUNTER — Other Ambulatory Visit: Payer: Self-pay

## 2021-07-09 ENCOUNTER — Other Ambulatory Visit: Payer: Self-pay

## 2021-07-25 ENCOUNTER — Other Ambulatory Visit: Payer: Self-pay

## 2021-07-31 ENCOUNTER — Other Ambulatory Visit: Payer: Self-pay

## 2021-08-13 ENCOUNTER — Other Ambulatory Visit: Payer: Self-pay | Admitting: Critical Care Medicine

## 2021-08-13 ENCOUNTER — Other Ambulatory Visit: Payer: Self-pay

## 2021-08-13 DIAGNOSIS — Z794 Long term (current) use of insulin: Secondary | ICD-10-CM

## 2021-08-13 DIAGNOSIS — E1165 Type 2 diabetes mellitus with hyperglycemia: Secondary | ICD-10-CM

## 2021-08-13 MED ORDER — HUMULIN N KWIKPEN 100 UNIT/ML ~~LOC~~ SUPN
12.0000 [IU] | PEN_INJECTOR | Freq: Two times a day (BID) | SUBCUTANEOUS | 1 refills | Status: DC
  Filled 2021-08-13: qty 6, 25d supply, fill #0
  Filled 2021-09-16: qty 3, 13d supply, fill #1

## 2021-08-13 MED ORDER — METFORMIN HCL ER 750 MG PO TB24
750.0000 mg | ORAL_TABLET | Freq: Two times a day (BID) | ORAL | 0 refills | Status: DC
  Filled 2021-08-13: qty 60, 30d supply, fill #0
  Filled 2021-10-15: qty 60, 30d supply, fill #1
  Filled 2022-03-02: qty 60, 30d supply, fill #2

## 2021-08-13 NOTE — Telephone Encounter (Signed)
Pt is completely out of medication.  Pt did not know he did not have refills.  Hopes to get today.

## 2021-08-13 NOTE — Telephone Encounter (Signed)
Requested Prescriptions  Pending Prescriptions Disp Refills   Insulin NPH, Human,, Isophane, (HUMULIN N KWIKPEN) 100 UNIT/ML Kiwkpen 15 mL 1    Sig: Inject 12 Units into the skin in the morning and at bedtime.     Endocrinology:  Diabetes - Insulins Failed - 08/13/2021 10:18 AM      Failed - HBA1C is between 0 and 7.9 and within 180 days    Hemoglobin A1C  Date Value Ref Range Status  06/26/2021 13.9 (A) 4.0 - 5.6 % Final   Hgb A1c MFr Bld  Date Value Ref Range Status  10/24/2019 8.4 (H) <5.7 % of total Hgb Final    Comment:    For someone without known diabetes, a hemoglobin A1c value of 6.5% or greater indicates that they may have  diabetes and this should be confirmed with a follow-up  test. . For someone with known diabetes, a value <7% indicates  that their diabetes is well controlled and a value  greater than or equal to 7% indicates suboptimal  control. A1c targets should be individualized based on  duration of diabetes, age, comorbid conditions, and  other considerations. . Currently, no consensus exists regarding use of hemoglobin A1c for diagnosis of diabetes for children. .          Failed - Valid encounter within last 6 months    Recent Outpatient Visits          6 months ago Type 2 diabetes mellitus with hyperglycemia, with long-term current use of insulin [facility])   [place] Elsie Stain, MD   8 months ago Type 2 diabetes mellitus with hyperglycemia, with long-term current use of insulin [facility])   [facility] [place], Drue Stager, MD   1 year ago Type 2 diabetes mellitus with ketoacidosis without coma, without long-term current use of insulin [facility])   [facility] Lebron Conners D, MD   1 year ago Type 2 diabetes mellitus without complication, without long-term current use of insulin [facility])   [facility] Towanda Malkin, MD      Future Appointments             In 3 months Elsie Stain, MD [place]            metFORMIN (GLUCOPHAGE XR) 750 MG 24 hr tablet 180 tablet 0    Sig: Take 1 tablet (750 mg total) by mouth in the morning and at bedtime.     Endocrinology:  Diabetes - Biguanides Failed - 08/13/2021 10:18 AM      Failed - HBA1C is between 0 and 7.9 and within 180 days    Hemoglobin A1C  Date Value Ref Range Status  06/26/2021 13.9 (A) 4.0 - 5.6 % Final   Hgb A1c MFr Bld  Date Value Ref Range Status  10/24/2019 8.4 (H) <5.7 % of total Hgb Final    Comment:    For someone without known diabetes, a hemoglobin A1c value of 6.5% or greater indicates that they may have  diabetes and this should be confirmed with a follow-up  test. . For someone with known diabetes, a value <7% indicates  that their diabetes is well controlled and a value  greater than or equal to 7% indicates suboptimal  control. A1c targets should be individualized based on  duration of diabetes, age, comorbid conditions, and  other considerations. . Currently, no consensus exists regarding use of hemoglobin A1c  for diagnosis of diabetes for children. .          Failed - Valid encounter within last 6 months    Recent Outpatient Visits          6 months ago Type 2 diabetes mellitus with hyperglycemia, with long-term current use of insulin [facility])   [place] Elsie Stain, MD   8 months ago Type 2 diabetes mellitus with hyperglycemia, with long-term current use of insulin [facility])   [facility] [place], Drue Stager, MD   1 year ago Type 2 diabetes mellitus with ketoacidosis without coma, without long-term current use of insulin [facility])   [facility] Lebron Conners D, MD   1 year ago Type 2 diabetes mellitus without complication, without long-term current use of insulin [facility])   [place], MD      Future  Appointments            In 3 months Elsie Stain, MD [place] in normal range and within 360 days    Creat  Date Value Ref Range Status  10/24/2019 0.95 0.60 - 1.35 mg/dL Final   Creatinine, Ser  Date Value Ref Range Status  03/25/2021 0.80 0.61 - 1.24 mg/dL Final   Creatinine, Urine  Date Value Ref Range Status  11/26/2020 285 20 - 320 mg/dL Final         Passed - eGFR in normal range and within 360 days    GFR, Est African American  Date Value Ref Range Status  10/24/2019 126 > OR = 60 mL/min/1.38m Final   GFR calc Af Amer  Date Value Ref Range Status  08/28/2020 136 >59 mL/min/1.73 Final    Comment:    **In accordance with recommendations from the NKF-ASN Task force,**   [facility] is in the process of updating its eGFR calculation to the   2021 CKD-EPI creatinine equation that estimates kidney function   without a race variable.    GFR, Est Non African American  Date Value Ref Range Status  10/24/2019 108 > OR = 60 mL/min/1.733mFinal   GFR, Estimated  Date Value Ref Range Status  03/25/2021 >60 >60 mL/min Final    Comment:    (NOTE) Calculated using the CKD-EPI Creatinine Equation (2021)          Passed - B12 Level in normal range and within 720 days    Vitamin B-12  Date Value Ref Range Status  11/26/2020 326 200 - 1,100 pg/mL Final    Comment:    . Please Note: Although the reference range for vitamin B12 is [phone] pg/mL, it has been reported that between 5 and 10% of patients with values between 200 and 400 pg/mL may experience neuropsychiatric and hematologic abnormalities due to occult B12 deficiency; less than 1% of patients with values above 400 pg/mL will have symptoms. .          Passed - CBC within normal limits and completed in the last 12 months    WBC  Date Value Ref Range Status  03/25/2021 4.8 4.0 - 10.5 K/uL Final   RBC  Date Value Ref Range Status  03/25/2021 4.23  4.22 - 5.81 MIL/uL Final   Hemoglobin  Date Value Ref Range Status  03/25/2021 13.8 13.0 - 17.0 g/dL Final   HCT  Date Value Ref  Range Status  03/25/2021 37.9 (L) 39.0 - 52.0 % Final   MCHC  Date Value Ref Range Status  03/25/2021 36.4 (H) 30.0 - 36.0 g/dL Final   [facility]  Date Value Ref Range Status  03/25/2021 32.6 26.0 - 34.0 pg Final   MCV  Date Value Ref Range Status  03/25/2021 89.6 80.0 - 100.0 fL Final   No results found for: PLTCOUNTKUC, LABPLAT, POCPLA RDW  Date Value Ref Range Status  03/25/2021 12.0 11.5 - 15.5 % Final

## 2021-08-13 NOTE — Telephone Encounter (Signed)
Requested medication (s) are due for refill today: yes  Requested medication (s) are on the active medication list: yes  Last refill:  01/16/21  Future visit scheduled: yes  Notes to clinic:  see pharmacy note and please advise change     Requested Prescriptions  Pending Prescriptions Disp Refills   Insulin NPH, Human,, Isophane, (HUMULIN N KWIKPEN) 100 UNIT/ML Kiwkpen 15 mL 1    Sig: Inject 12 Units into the skin in the morning and at bedtime.     Endocrinology:  Diabetes - Insulins Failed - 08/13/2021 10:18 AM      Failed - HBA1C is between 0 and 7.9 and within 180 days    Hemoglobin A1C  Date Value Ref Range Status  06/26/2021 13.9 (A) 4.0 - 5.6 % Final   Hgb A1c MFr Bld  Date Value Ref Range Status  10/24/2019 8.4 (H) <5.7 % of total Hgb Final    Comment:    For someone without known diabetes, a hemoglobin A1c value of 6.5% or greater indicates that they may have  diabetes and this should be confirmed with a follow-up  test. . For someone with known diabetes, a value <7% indicates  that their diabetes is well controlled and a value  greater than or equal to 7% indicates suboptimal  control. A1c targets should be individualized based on  duration of diabetes, age, comorbid conditions, and  other considerations. . Currently, no consensus exists regarding use of hemoglobin A1c for diagnosis of diabetes for children. .           Failed - Valid encounter within last 6 months    Recent Outpatient Visits           6 months ago Type 2 diabetes mellitus with hyperglycemia, with long-term current use of insulin [facility])   [place] Elsie Stain, MD   8 months ago Type 2 diabetes mellitus with hyperglycemia, with long-term current use of insulin [facility])   [facility] [place], Drue Stager, MD   1 year ago Type 2 diabetes mellitus with ketoacidosis without coma, without long-term current use of insulin [facility])   [facility] Lebron Conners D, MD   1 year ago Type 2 diabetes mellitus without complication, without long-term current use of insulin [facility])   [facility] Towanda Malkin, MD       Future Appointments             In 3 months Elsie Stain, MD [place]            Signed Prescriptions Disp Refills   metFORMIN (GLUCOPHAGE XR) 750 MG 24 hr tablet 180 tablet 0    Sig: Take 1 tablet (750 mg total) by mouth in the morning and at bedtime.     Endocrinology:  Diabetes - Biguanides Failed - 08/13/2021 10:18 AM      Failed - HBA1C is between 0 and 7.9 and within 180 days    Hemoglobin A1C  Date Value Ref Range Status  06/26/2021 13.9 (A) 4.0 - 5.6 % Final   Hgb A1c MFr Bld  Date Value Ref Range Status  10/24/2019 8.4 (H) <5.7 % of total Hgb Final    Comment:    For someone without known diabetes, a hemoglobin A1c value of 6.5% or greater indicates that they may have  diabetes and this should be confirmed with a follow-up  test. . For someone  with known diabetes, a value <7% indicates  that their diabetes is well controlled and a value  greater than or equal to 7% indicates suboptimal  control. A1c targets should be individualized based on  duration of diabetes, age, comorbid conditions, and  other considerations. . Currently, no consensus exists regarding use of hemoglobin A1c for diagnosis of diabetes for children. .           Failed - Valid encounter within last 6 months    Recent Outpatient Visits           6 months ago Type 2 diabetes mellitus with hyperglycemia, with long-term current use of insulin [facility])   [place] Elsie Stain, MD   8 months ago Type 2 diabetes mellitus with hyperglycemia, with long-term current use of insulin [facility])   [facility] [place], Drue Stager, MD   1 year ago Type 2 diabetes mellitus with ketoacidosis without coma,  without long-term current use of insulin [facility])   [facility] Lebron Conners D, MD   1 year ago Type 2 diabetes mellitus without complication, without long-term current use of insulin [facility])   [place], MD       Future Appointments             In 3 months Elsie Stain, MD [place] in normal range and within 360 days    Creat  Date Value Ref Range Status  10/24/2019 0.95 0.60 - 1.35 mg/dL Final   Creatinine, Ser  Date Value Ref Range Status  03/25/2021 0.80 0.61 - 1.24 mg/dL Final   Creatinine, Urine  Date Value Ref Range Status  11/26/2020 285 20 - 320 mg/dL Final          Passed - eGFR in normal range and within 360 days    GFR, Est African American  Date Value Ref Range Status  10/24/2019 126 > OR = 60 mL/min/1.11m Final   GFR calc Af Amer  Date Value Ref Range Status  08/28/2020 136 >59 mL/min/1.73 Final    Comment:    **In accordance with recommendations from the NKF-ASN Task force,**   [facility] is in the process of updating its eGFR calculation to the   2021 CKD-EPI creatinine equation that estimates kidney function   without a race variable.    GFR, Est Non African American  Date Value Ref Range Status  10/24/2019 108 > OR = 60 mL/min/1.713mFinal   GFR, Estimated  Date Value Ref Range Status  03/25/2021 >60 >60 mL/min Final    Comment:    (NOTE) Calculated using the CKD-EPI Creatinine Equation (2021)           Passed - B12 Level in normal range and within 720 days    Vitamin B-12  Date Value Ref Range Status  11/26/2020 326 200 - 1,100 pg/mL Final    Comment:    . Please Note: Although the reference range for vitamin B12 is [phone] pg/mL, it has been reported that between 5 and 10% of patients with values between 200 and 400 pg/mL may experience neuropsychiatric and hematologic abnormalities due to occult B12  deficiency; less than 1% of patients with values above 400 pg/mL will have symptoms. .           Passed - CBC within normal limits and completed  in the last 12 months    WBC  Date Value Ref Range Status  03/25/2021 4.8 4.0 - 10.5 K/uL Final   RBC  Date Value Ref Range Status  03/25/2021 4.23 4.22 - 5.81 MIL/uL Final   Hemoglobin  Date Value Ref Range Status  03/25/2021 13.8 13.0 - 17.0 g/dL Final   HCT  Date Value Ref Range Status  03/25/2021 37.9 (L) 39.0 - 52.0 % Final   MCHC  Date Value Ref Range Status  03/25/2021 36.4 (H) 30.0 - 36.0 g/dL Final   [facility]  Date Value Ref Range Status  03/25/2021 32.6 26.0 - 34.0 pg Final   MCV  Date Value Ref Range Status  03/25/2021 89.6 80.0 - 100.0 fL Final   No results found for: PLTCOUNTKUC, LABPLAT, POCPLA RDW  Date Value Ref Range Status  03/25/2021 12.0 11.5 - 15.5 % Final

## 2021-08-13 NOTE — Telephone Encounter (Signed)
Copied from [place] [id number]. Topic: Quick Communication - Rx Refill/Question >> Aug 13, 2021  4:49 PM Tessa Lerner A wrote: Medication: Rx #: [id number]  Insulin Pen Needle 32G X 4 MM MISC [id number]    Has the patient contacted their pharmacy? Yes.   (Agent: If no, request that the patient contact the pharmacy for the refill. If patient does not wish to contact the pharmacy document the reason why and proceed with request.) (Agent: If yes, when and what did the pharmacy advise?)  Preferred Pharmacy (with phone number or street name): [facility] [address] [place] [address] Phone: [phone] Fax: [phone]   Has the patient been seen for an appointment in the last year OR does the patient have an upcoming appointment? Yes.    Agent: Please be advised that RX refills may take up to 3 business days. We ask that you follow-up with your pharmacy.

## 2021-08-14 ENCOUNTER — Other Ambulatory Visit: Payer: Self-pay | Admitting: Pharmacist

## 2021-08-14 ENCOUNTER — Other Ambulatory Visit: Payer: Self-pay

## 2021-08-14 DIAGNOSIS — Z794 Long term (current) use of insulin: Secondary | ICD-10-CM

## 2021-08-14 DIAGNOSIS — E1165 Type 2 diabetes mellitus with hyperglycemia: Secondary | ICD-10-CM

## 2021-08-14 MED ORDER — INSULIN PEN NEEDLE 32G X 4 MM MISC
1.0000 "application " | Freq: Two times a day (BID) | 1 refills | Status: DC
  Filled 2021-08-14: qty 60, fill #0

## 2021-08-14 MED ORDER — INSULIN PEN NEEDLE 32G X 4 MM MISC
11 refills | Status: DC
  Filled 2021-08-14 – 2021-09-16 (×2): qty 100, 50d supply, fill #0

## 2021-08-14 NOTE — Telephone Encounter (Signed)
Requested Prescriptions  Pending Prescriptions Disp Refills   Insulin Pen Needle 32G X 4 MM MISC 60 each 1    Sig: 1 application by Does not apply route 2 (two) times daily.     Endocrinology: Diabetes - Testing Supplies Passed - 08/13/2021  6:12 PM      Passed - Valid encounter within last 12 months    Recent Outpatient Visits          7 months ago Type 2 diabetes mellitus with hyperglycemia, with long-term current use of insulin [facility])   [place] [facility] Storm Frisk, MD   8 months ago Type 2 diabetes mellitus with hyperglycemia, with long-term current use of insulin [facility])   [facility] [place], Danna Hefty, MD   1 year ago Type 2 diabetes mellitus with ketoacidosis without coma, without long-term current use of insulin [facility])   [facility] Welford Roche D, MD   1 year ago Type 2 diabetes mellitus without complication, without long-term current use of insulin [facility])   [facility] Jamelle Haring, MD      Future Appointments            In 3 months [place] Charlcie Cradle, MD [facility]

## 2021-08-21 ENCOUNTER — Other Ambulatory Visit: Payer: Self-pay

## 2021-09-16 ENCOUNTER — Other Ambulatory Visit: Payer: Self-pay

## 2021-09-16 ENCOUNTER — Other Ambulatory Visit: Payer: Self-pay | Admitting: Critical Care Medicine

## 2021-09-16 DIAGNOSIS — E559 Vitamin D deficiency, unspecified: Secondary | ICD-10-CM

## 2021-09-17 NOTE — Telephone Encounter (Signed)
Requested medications are due for refill today.  yes ? ?Requested medications are on the active medications list.  yes ? ?Last refill. 01/16/2021#4 2 refills ? ?Future visit scheduled.   yes ? ?Notes to clinic.  Medication needs manual review. ? ? ? ?Requested Prescriptions  ?Pending Prescriptions Disp Refills  ? Vitamin D, Ergocalciferol, (DRISDOL) 1.25 MG (50000 UNIT) CAPS capsule 4 capsule 2  ?  Sig: Take 1 capsule (50,000 Units total) by mouth every 7 (seven) days.  ?  ? Endocrinology:  Vitamins - Vitamin D Supplementation 2 Failed - 09/16/2021 11:24 AM  ?  ?  Failed - Manual Review: Route requests for 50,000 IU strength to the provider  ?  ?  Failed - Vitamin D in normal range and within 360 days  ?  Vit D, 25-Hydroxy  ?Date Value Ref Range Status  ?11/25/2020 25.2 (L) 30.0 - 100.0 ng/mL Final  ?  Comment:  ?  Vitamin D deficiency has been defined by the Institute of ?Medicine and an Endocrine Society practice guideline as a ?level of serum 25-OH vitamin D less than 20 ng/mL (1,2). ?The Endocrine Society went on to further define vitamin D ?insufficiency as a level between 21 and 29 ng/mL (2). ?1. IOM (Institute of Medicine). 2010. Dietary reference ?   intakes for calcium and D. [place]: The ?   [employer]. ?2. Holick MF, Binkley [place], Bischoff-Ferrari HA, et al. ?   Evaluation, treatment, and prevention of vitamin D ?   deficiency: an Endocrine Society clinical practice ?   guideline. JCEM. 2011 Jul; 96(7):1911-30. ?  ?  ?  ?  ?  Passed - Ca in normal range and within 360 days  ?  Calcium  ?Date Value Ref Range Status  ?03/25/2021 8.9 8.9 - 10.3 mg/dL Final  ?  ?  ?  ?  Passed - Valid encounter within last 12 months  ?  Recent Outpatient Visits   ? ?      ? 8 months ago Type 2 diabetes mellitus with hyperglycemia, with long-term current use of insulin (HCC)  ? [facility] Storm Frisk, MD  ? 9 months ago Type 2 diabetes mellitus with hyperglycemia, with  long-term current use of insulin (HCC)  ? [facility] Alba Cory, MD  ? 1 year ago Type 2 diabetes mellitus with ketoacidosis without coma, without long-term current use of insulin (HCC)  ? [facility] Welford Roche D, MD  ? 2 years ago Type 2 diabetes mellitus without complication, without long-term current use of insulin (HCC)  ? [facility] Jamelle Haring, MD  ? ?  ?  ?Future Appointments   ? ?        ? In 2 months Storm Frisk, MD [facility]  ? ?  ? ?  ?  ?  ?  ?

## 2021-09-18 ENCOUNTER — Other Ambulatory Visit: Payer: Self-pay

## 2021-09-22 ENCOUNTER — Other Ambulatory Visit: Payer: Self-pay | Admitting: Pharmacist

## 2021-09-22 DIAGNOSIS — Z794 Long term (current) use of insulin: Secondary | ICD-10-CM

## 2021-09-22 DIAGNOSIS — E1165 Type 2 diabetes mellitus with hyperglycemia: Secondary | ICD-10-CM

## 2021-09-22 MED ORDER — HUMULIN N KWIKPEN 100 UNIT/ML ~~LOC~~ SUPN: 12.0000 [IU] | PEN_INJECTOR | Freq: Two times a day (BID) | SUBCUTANEOUS | 1 refills | Status: DC

## 2021-09-24 ENCOUNTER — Other Ambulatory Visit: Payer: Self-pay

## 2021-10-01 ENCOUNTER — Other Ambulatory Visit: Payer: Self-pay

## 2021-10-03 ENCOUNTER — Telehealth: Payer: Self-pay | Admitting: Critical Care Medicine

## 2021-10-03 NOTE — Telephone Encounter (Signed)
Called pt to reschedule appt on 5/16, due to Dr.Wright will be out of the office. No answer. Voicemail full couldn't LVM ?

## 2021-10-14 ENCOUNTER — Other Ambulatory Visit: Payer: Self-pay

## 2021-10-15 ENCOUNTER — Other Ambulatory Visit: Payer: Self-pay | Admitting: Critical Care Medicine

## 2021-10-15 ENCOUNTER — Other Ambulatory Visit: Payer: Self-pay

## 2021-10-15 DIAGNOSIS — E559 Vitamin D deficiency, unspecified: Secondary | ICD-10-CM

## 2021-10-16 ENCOUNTER — Other Ambulatory Visit: Payer: Self-pay

## 2021-11-25 ENCOUNTER — Ambulatory Visit: Payer: Medicaid Other | Admitting: Critical Care Medicine

## 2022-03-02 ENCOUNTER — Other Ambulatory Visit: Payer: Self-pay | Admitting: Critical Care Medicine

## 2022-03-02 ENCOUNTER — Other Ambulatory Visit: Payer: Self-pay

## 2022-03-02 DIAGNOSIS — E559 Vitamin D deficiency, unspecified: Secondary | ICD-10-CM

## 2022-03-02 DIAGNOSIS — Z794 Long term (current) use of insulin: Secondary | ICD-10-CM

## 2022-03-02 DIAGNOSIS — E1165 Type 2 diabetes mellitus with hyperglycemia: Secondary | ICD-10-CM

## 2022-03-03 ENCOUNTER — Other Ambulatory Visit: Payer: Self-pay

## 2022-03-03 MED ORDER — HUMULIN N KWIKPEN 100 UNIT/ML ~~LOC~~ SUPN
12.0000 [IU] | PEN_INJECTOR | Freq: Two times a day (BID) | SUBCUTANEOUS | 0 refills | Status: DC
  Filled 2022-03-03: qty 3, 13d supply, fill #0

## 2022-03-03 NOTE — Telephone Encounter (Signed)
Requested medication (s) are due for refill today: Yes  Requested medication (s) are on the active medication list: Yes  Last refill:  01/16/21  Future visit scheduled: Yes  Notes to clinic:  Vit D has expired.    Requested Prescriptions  Pending Prescriptions Disp Refills   Vitamin D, Ergocalciferol, (DRISDOL) 1.25 MG (50000 UNIT) CAPS capsule 4 capsule 2    Sig: Take 1 capsule (50,000 Units total) by mouth every 7 (seven) days.     Endocrinology:  Vitamins - Vitamin D Supplementation 2 Failed - 03/02/2022 11:01 AM      Failed - Manual Review: Route requests for 50,000 IU strength to the provider      Failed - Vitamin D in normal range and within 360 days    Vit D, 25-Hydroxy  Date Value Ref Range Status  11/25/2020 25.2 (L) 30.0 - 100.0 ng/mL Final    Comment:    Vitamin D deficiency has been defined by the Institute of Medicine and an Endocrine Society practice guideline as a level of serum 25-OH vitamin D less than 20 ng/mL (1,2). The Endocrine Society went on to further define vitamin D insufficiency as a level between 21 and 29 ng/mL (2). 1. IOM (Institute of Medicine). 2010. Dietary reference    intakes for calcium and D. [place]: The    [employer]. 2. Holick MF, Binkley [place], Bischoff-Ferrari HA, et al.    Evaluation, treatment, and prevention of vitamin D    deficiency: an Endocrine Society clinical practice    guideline. JCEM. 2011 Jul; 96(7):1911-30.          Failed - Valid encounter within last 12 months    Recent Outpatient Visits           1 year ago Type 2 diabetes mellitus with hyperglycemia, with long-term current use of insulin (HCC)   [facility] Storm Frisk, MD   1 year ago Type 2 diabetes mellitus with hyperglycemia, with long-term current use of insulin [facility])   [facility], Danna Hefty, MD   2 years ago Type 2 diabetes mellitus with ketoacidosis without coma, without  long-term current use of insulin [facility])   [facility] Welford Roche D, MD   2 years ago Type 2 diabetes mellitus without complication, without long-term current use of insulin [facility])   [facility] Jamelle Haring, MD       Future Appointments             Tomorrow Storm Frisk, MD [facility]   In 2 months Marcine Matar, MD [facility]            Passed - Ca in normal range and within 360 days    Calcium  Date Value Ref Range Status  03/25/2021 8.9 8.9 - 10.3 mg/dL Final          Insulin NPH, Human,, Isophane, (HUMULIN N KWIKPEN) 100 UNIT/ML Kiwkpen 15 mL 1    Sig: Inject 12 Units into the skin in the morning and at bedtime.     Endocrinology:  Diabetes - Insulins Failed - 03/02/2022 11:01 AM      Failed - HBA1C is between 0 and 7.9 and within 180 days    Hemoglobin A1C  Date Value Ref Range Status  06/26/2021 13.9 (A) 4.0 - 5.6 % Final   Hgb A1c MFr Bld  Date Value  Ref Range Status  10/24/2019 8.4 (H) <5.7 % of total Hgb Final    Comment:    For someone without known diabetes, a hemoglobin A1c value of 6.5% or greater indicates that they may have  diabetes and this should be confirmed with a follow-up  test. . For someone with known diabetes, a value <7% indicates  that their diabetes is well controlled and a value  greater than or equal to 7% indicates suboptimal  control. A1c targets should be individualized based on  duration of diabetes, age, comorbid conditions, and  other considerations. . Currently, no consensus exists regarding use of hemoglobin A1c for diagnosis of diabetes for children. .          Failed - Valid encounter within last 6 months    Recent Outpatient Visits           1 year ago Type 2 diabetes mellitus with hyperglycemia, with long-term current use of insulin (HCC)   [facility]  Storm Frisk, MD   1 year ago Type 2 diabetes mellitus with hyperglycemia, with long-term current use of insulin [facility])   [facility] Alba Cory, MD   2 years ago Type 2 diabetes mellitus with ketoacidosis without coma, without long-term current use of insulin [facility])   [facility] Welford Roche D, MD   2 years ago Type 2 diabetes mellitus without complication, without long-term current use of insulin [facility])   [facility] Jamelle Haring, MD       Future Appointments             Tomorrow Storm Frisk, MD [facility]   In 2 months Marcine Matar, MD [facility]

## 2022-03-04 ENCOUNTER — Telehealth: Payer: Self-pay

## 2022-03-04 ENCOUNTER — Telehealth (HOSPITAL_BASED_OUTPATIENT_CLINIC_OR_DEPARTMENT_OTHER): Payer: Medicaid Other | Admitting: Critical Care Medicine

## 2022-03-04 ENCOUNTER — Encounter: Payer: Self-pay | Admitting: Critical Care Medicine

## 2022-03-04 DIAGNOSIS — Z91199 Patient's noncompliance with other medical treatment and regimen due to unspecified reason: Secondary | ICD-10-CM

## 2022-03-04 NOTE — Progress Notes (Signed)
Patient missed his appointment I tried calling several numbers with no answer I did speak to his Sister Jerome Murphy she tried to call and could not get him to answer.  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  Have not seen him in over a year and I must see him to get back on the correct medications for his diabetes  We will have our staff contact the patient

## 2022-03-04 NOTE — Progress Notes (Deleted)
New Patient Office Visit  Subjective:  Patient ID: Jerome Murphy, male    DOB: 1990-11-04  Age: 31 y.o. MRN: [id number]  History of Present Illness:   CC: PCP to est , DM care   HPI 01/16/2021 Jerome Murphy presents for transition to primary care.  He was going to a  medical group practice that was charging a co-pay and he cannot afford this.  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  He is hoping to get private insurance but is yet to achieve this.  This is a phone visit as he is driving a truck now cannot be on video.  Patient's last visit in May with the [facility] health medical group practice is as documented below Saw [facility] 11/2020 DMII : diagnosed in 2019 initially on metformin but on NPH insulin since admitted for DKA back in 07/2019. He only has medicaid family planning and has difficulty keeping up with appointments. He states he was compliant with medication since Feb 2022 because he went to [facility] for kidney stone and his A1C was very high at 11.7 %. He noticed some tingling on his fingers over the past few weeks and finally went to a walk in yesterday, his A1C was down to 8.2 %, vitamin D was little low, and glucose was elevated. He was given gabapentin but has not started yet. He has been checking glucose at home occasionally and can go from 90-120, sometimes can go up to 300. Explained that low b12 can cause neuropathy and recommend to check level today and if negative gabapentin may be a good option for diabetic neuropathy Explained his last LDL was not at goal and we will adjust dose of atorvastatin to 20 mg Remind him to have yearly urine micro and an eye exam   He also states has a new job, 2 months ago, working for [employer] and does a lot of loading and unloading and opening boxes, he is right hand dominant, explained it may be carpal tunnel syndrome and to try to wear a brace, avoid gabapentin if symptoms not severe   1. Type 2  diabetes mellitus with hyperglycemia, with long-term current use of insulin (HCC)   - Microalbumin / creatinine urine ratio - [facility] Diabetes Foot Exam - metFORMIN (GLUCOPHAGE XR) 750 MG 24 hr tablet; Take 1 tablet (750 mg total) by mouth in the morning and at bedtime.  Dispense: 180 tablet; Refill: 0 - Insulin NPH, Human,, Isophane, (HUMULIN N) 100 UNIT/ML Kiwkpen; Inject 12 Units into the skin in the morning and at bedtime.  Dispense: 15 mL; Refill: 1   2. Neuropathy   - Vitamin B12 Possible carpal tunnel    3. Vitamin D deficiency   - Vitamin D, Ergocalciferol, (DRISDOL) 1.25 MG (50000 UNIT) CAPS capsule; Take 1 capsule (50,000 Units total) by mouth every 7 (seven) days.  Dispense: 4 capsule; Refill: 2  Patient states since that visit his blood glucoses are running in the low 100 range occasionally he gets up to 160 postprandial.  He does need refill on his ReliOn glucose test strips.  He is smoking a pack a day of cigarettes.  He has no other complaints on review of systems at this time.  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 we will hold Neurontin for now and hope to control his sugars and if this will result in improvement in neuropathy          Relevant Medications    atorvastatin (LIPITOR) 20 MG tablet    Insulin NPH, Human,, Isophane, (HUMULIN N) 100 UNIT/ML Kiwkpen    metFORMIN (GLUCOPHAGE XR) 750 MG 24 hr tablet        Other    Tobacco dependence             Past Medical History:  Diagnosis Date   Asthma    Diabetes mellitus without complication ([place])    History of gallstones    Narcolepsy    Seizures ([place])     Past Surgical History:  Procedure Laterality Date   CIRCUMCISION     HERNIA REPAIR     TONSILLECTOMY      Family History  Problem Relation Age of Onset   Diabetes Mother    Diabetes Maternal Uncle     Social History   Socioeconomic History   Marital status: Single    Spouse name: Not on file   Number of children: 2   Years of education: Not on file   Highest education level: Associate degree: occupational, Hotel manager, or vocational program  Occupational History   Occupation: Barbershop  Tobacco Use   Smoking status: Every Day    Packs/day: 0.25    Types: Cigarettes   Smokeless tobacco: Never  Vaping Use   Vaping Use: Never used  Substance and Sexual Activity   Alcohol use: No    Comment: occasionallly   Drug use: No   Sexual activity: Yes    Birth control/protection: Condom    Comment: Partner uses Depo  Other Topics Concern   Not on file  Social History Narrative   Not on file   Social Determinants of Health   Financial Resource Strain: Not on file  Food Insecurity: Not on file  Transportation Needs: Not on file  Physical Activity: Not on file  Stress: Not on file  Social Connections: Not on file  Intimate Partner Violence: Not on file    ROS Review of Systems  Constitutional: Negative.   HENT: Negative.    Eyes: Negative.   Respiratory: Negative.    Cardiovascular: Negative.   Gastrointestinal: Negative.   Musculoskeletal:        Hand pain  Skin: Negative.   Psychiatric/Behavioral:  Negative for dysphoric mood, self-injury and suicidal ideas. The patient is not nervous/anxious.     Objective:   Today's Vitals: There were no vitals taken for this visit. No exam this is a phone visit Physical Exam  Assessment & Plan:   Problem List Items Addressed This Visit    None  Outpatient Encounter Medications as of 03/04/2022  Medication Sig   albuterol (VENTOLIN HFA) 108 (90 Base) MCG/ACT inhaler Inhale 2 puffs into the lungs every 6 (six) hours as needed for wheezing or shortness of breath. (Patient not taking: Reported on 06/26/2021)   albuterol (VENTOLIN HFA) 108 (90 Base) MCG/ACT inhaler Inhale 2 puffs by mouth into  the lungs every 6 hours as needed for wheezing or shortness of breath. (Patient not taking: Reported on 06/26/2021)   atorvastatin (LIPITOR) 20 MG tablet Take 1 tablet (20 mg total) by mouth daily. (Patient not taking: Reported on 06/26/2021)   Blood Glucose Monitoring Suppl (TRUE METRIX METER) w/Device KIT Use to measure blood sugar twice a day   gabapentin (NEURONTIN) 300 MG capsule Take 1 capsule (300 mg total) by mouth at bedtime.   glucose blood (TRUE METRIX BLOOD GLUCOSE TEST) test strip Use as instructed   Insulin NPH, Human,, Isophane, (HUMULIN N KWIKPEN) 100 UNIT/ML Kiwkpen Inject 12 Units into the skin in the morning and at bedtime.   Insulin Pen Needle 32G X 4 MM MISC Use to inject insulin twice daily.   metFORMIN (GLUCOPHAGE-XR) 750 MG 24 hr tablet Take 1 tablet (750 mg total) by mouth in the morning and at bedtime.   ondansetron (ZOFRAN ODT) 4 MG disintegrating tablet Take 1 tablet (4 mg total) by mouth every 8 (eight) hours as needed.   predniSONE (DELTASONE) 20 MG tablet Take 3 tablets by  mouth daily with breakfast for 4 days.   TRUEplus Lancets 28G MISC Use to measure blood sugar twice a day   No facility-administered encounter medications on file as of 03/04/2022.    Follow-up: No follow-ups on file.   Follow Up Instructions: Patient knows a face-to-face exam will occur in the next 4 to 6 weeks refills on all medications sent to the pharmacy   I discussed the assessment and treatment plan with the patient. The patient was provided an opportunity to ask questions and all were answered. The patient agreed with the plan and  demonstrated an understanding of the instructions.   The patient was advised to call back or seek an in-person evaluation if the symptoms worsen or if the condition fails to improve as anticipated.  I provided 31 minutes of non-face-to-face time during this encounter  including  median intraservice time , review of notes, labs, imaging, medications  and explaining diagnosis and management to the patient .    Asencion Noble, MD

## 2022-03-04 NOTE — Telephone Encounter (Signed)
-----   Message from Storm Frisk, MD sent at 03/04/2022 10:06 AM EDT ----- Regarding: Missed appointment today The patient missed his virtual visit today please contact him and get him in with me face-to-face so we can continue to care for his diabetes okay to double book

## 2022-03-04 NOTE — Telephone Encounter (Signed)
Pt was called and mailbox is currently full and can not accept any messages

## 2022-03-09 ENCOUNTER — Other Ambulatory Visit: Payer: Self-pay

## 2022-03-10 ENCOUNTER — Other Ambulatory Visit: Payer: Self-pay

## 2022-05-18 ENCOUNTER — Ambulatory Visit: Payer: Medicaid Other | Attending: Internal Medicine | Admitting: Internal Medicine

## 2022-05-18 ENCOUNTER — Other Ambulatory Visit: Payer: Self-pay

## 2022-05-18 ENCOUNTER — Encounter: Payer: Self-pay | Admitting: *Deleted

## 2022-05-18 ENCOUNTER — Inpatient Hospital Stay
Admission: EM | Admit: 2022-05-18 | Discharge: 2022-05-18 | DRG: 639 | Discharge status: Against Medical Advice | Payer: Self-pay | Attending: Internal Medicine | Admitting: Internal Medicine

## 2022-05-18 DIAGNOSIS — E1142 Type 2 diabetes mellitus with diabetic polyneuropathy: Secondary | ICD-10-CM | POA: Diagnosis present

## 2022-05-18 DIAGNOSIS — R519 Headache, unspecified: Secondary | ICD-10-CM | POA: Diagnosis present

## 2022-05-18 DIAGNOSIS — F17210 Nicotine dependence, cigarettes, uncomplicated: Secondary | ICD-10-CM | POA: Diagnosis present

## 2022-05-18 DIAGNOSIS — Z833 Family history of diabetes mellitus: Secondary | ICD-10-CM

## 2022-05-18 DIAGNOSIS — E1310 Other specified diabetes mellitus with ketoacidosis without coma: Principal | ICD-10-CM

## 2022-05-18 DIAGNOSIS — Z5329 Procedure and treatment not carried out because of patient's decision for other reasons: Secondary | ICD-10-CM | POA: Diagnosis present

## 2022-05-18 DIAGNOSIS — E1110 Type 2 diabetes mellitus with ketoacidosis without coma: Principal | ICD-10-CM | POA: Diagnosis present

## 2022-05-18 DIAGNOSIS — R000 Tachycardia, unspecified: Secondary | ICD-10-CM

## 2022-05-18 DIAGNOSIS — Z794 Long term (current) use of insulin: Secondary | ICD-10-CM

## 2022-05-18 DIAGNOSIS — Z888 Allergy status to other drugs, medicaments and biological substances status: Secondary | ICD-10-CM

## 2022-05-18 DIAGNOSIS — E119 Type 2 diabetes mellitus without complications: Secondary | ICD-10-CM

## 2022-05-18 DIAGNOSIS — J45909 Unspecified asthma, uncomplicated: Secondary | ICD-10-CM | POA: Diagnosis present

## 2022-05-18 HISTORY — DX: Type 2 diabetes mellitus with ketoacidosis without coma: E11.10

## 2022-05-18 LAB — CBC WITH DIFFERENTIAL/PLATELET
Abs Immature Granulocytes: 0.02 10*3/uL (ref 0.00–0.07)
Basophils Absolute: 0.1 10*3/uL (ref 0.0–0.1)
Basophils Relative: 1 %
Eosinophils Absolute: 0.6 10*3/uL — ABNORMAL HIGH (ref 0.0–0.5)
Eosinophils Relative: 7 %
HCT: 48.9 % (ref 39.0–52.0)
Hemoglobin: 16.8 g/dL (ref 13.0–17.0)
Immature Granulocytes: 0 %
Lymphocytes Relative: 36 %
Lymphs Abs: 2.7 10*3/uL (ref 0.7–4.0)
MCH: 31.1 pg (ref 26.0–34.0)
MCHC: 34.4 g/dL (ref 30.0–36.0)
MCV: 90.6 fL (ref 80.0–100.0)
Monocytes Absolute: 0.4 10*3/uL (ref 0.1–1.0)
Monocytes Relative: 5 %
Neutro Abs: 3.8 10*3/uL (ref 1.7–7.7)
Neutrophils Relative %: 51 %
Platelets: 238 10*3/uL (ref 150–400)
RBC: 5.4 MIL/uL (ref 4.22–5.81)
RDW: 11.3 % — ABNORMAL LOW (ref 11.5–15.5)
WBC: 7.6 10*3/uL (ref 4.0–10.5)
nRBC: 0 % (ref 0.0–0.2)

## 2022-05-18 LAB — BASIC METABOLIC PANEL
Anion gap: 9 (ref 5–15)
Anion gap: 6 (ref 5–15)
BUN: 12 mg/dL (ref 6–20)
BUN: 10 mg/dL (ref 6–20)
CO2: 15 mmol/L — ABNORMAL LOW (ref 22–32)
CO2: 19 mmol/L — ABNORMAL LOW (ref 22–32)
Calcium: 8.3 mg/dL — ABNORMAL LOW (ref 8.9–10.3)
Calcium: 8.3 mg/dL — ABNORMAL LOW (ref 8.9–10.3)
Chloride: 111 mmol/L (ref 98–111)
Chloride: 111 mmol/L (ref 98–111)
Creatinine, Ser: 0.67 mg/dL (ref 0.61–1.24)
Creatinine, Ser: 0.58 mg/dL — ABNORMAL LOW (ref 0.61–1.24)
GFR, Estimated: >60 mL/min (ref >60)
GFR, Estimated: >60 mL/min (ref >60)
Glucose, Bld: 171 mg/dL — ABNORMAL HIGH (ref 70–99)
Glucose, Bld: 124 mg/dL — ABNORMAL HIGH (ref 70–99)
Potassium: 4 mmol/L (ref 3.5–5.1)
Potassium: 3.5 mmol/L (ref 3.5–5.1)
Sodium: 135 mmol/L (ref 135–145)
Sodium: 136 mmol/L (ref 135–145)

## 2022-05-18 LAB — COMPREHENSIVE METABOLIC PANEL
ALT: 18 U/L (ref 0–44)
AST: 14 U/L — ABNORMAL LOW (ref 15–41)
Albumin: 4.7 g/dL (ref 3.5–5.0)
Alkaline Phosphatase: 102 U/L (ref 38–126)
Anion gap: 15 (ref 5–15)
BUN: 14 mg/dL (ref 6–20)
CO2: 15 mmol/L — ABNORMAL LOW (ref 22–32)
Calcium: 9 mg/dL (ref 8.9–10.3)
Chloride: 103 mmol/L (ref 98–111)
Creatinine, Ser: 1.08 mg/dL (ref 0.61–1.24)
GFR, Estimated: >60 mL/min (ref >60)
Glucose, Bld: 332 mg/dL — ABNORMAL HIGH (ref 70–99)
Potassium: 5.2 mmol/L — ABNORMAL HIGH (ref 3.5–5.1)
Sodium: 133 mmol/L — ABNORMAL LOW (ref 135–145)
Total Bilirubin: 1.2 mg/dL (ref 0.3–1.2)
Total Protein: 8.8 g/dL — ABNORMAL HIGH (ref 6.5–8.1)

## 2022-05-18 LAB — URINALYSIS, ROUTINE W REFLEX MICROSCOPIC
Bacteria, UA: NONE SEEN
Bilirubin Urine: NEGATIVE
Glucose, UA: >=500 mg/dL — AB
Hgb urine dipstick: NEGATIVE
Ketones, ur: 80 mg/dL — AB
Leukocytes,Ua: NEGATIVE
Nitrite: NEGATIVE
Protein, ur: 100 mg/dL — AB
Specific Gravity, Urine: 1.03 (ref 1.005–1.030)
pH: 5 (ref 5.0–8.0)

## 2022-05-18 LAB — CBG MONITORING, ED
Glucose-Capillary: 337 mg/dL — ABNORMAL HIGH (ref 70–99)
Glucose-Capillary: 254 mg/dL — ABNORMAL HIGH (ref 70–99)
Glucose-Capillary: 93 mg/dL (ref 70–99)
Glucose-Capillary: 177 mg/dL — ABNORMAL HIGH (ref 70–99)
Glucose-Capillary: 188 mg/dL — ABNORMAL HIGH (ref 70–99)
Glucose-Capillary: 161 mg/dL — ABNORMAL HIGH (ref 70–99)
Glucose-Capillary: 160 mg/dL — ABNORMAL HIGH (ref 70–99)
Glucose-Capillary: 154 mg/dL — ABNORMAL HIGH (ref 70–99)

## 2022-05-18 LAB — LIPASE, BLOOD: Lipase: 27 U/L (ref 11–51)

## 2022-05-18 LAB — HEMOGLOBIN A1C
Hgb A1c MFr Bld: 13.9 % — ABNORMAL HIGH (ref 4.8–5.6)
Mean Plasma Glucose: 352.23 mg/dL

## 2022-05-18 LAB — BETA-HYDROXYBUTYRIC ACID: Beta-Hydroxybutyric Acid: 3.27 mmol/L — ABNORMAL HIGH (ref 0.05–0.27)

## 2022-05-18 LAB — HIV ANTIBODY (ROUTINE TESTING W REFLEX): HIV Screen 4th Generation wRfx: NONREACTIVE

## 2022-05-18 MED ORDER — ALBUTEROL SULFATE (2.5 MG/3ML) 0.083% IN NEBU: 3.0000 mL | INHALATION_SOLUTION | Freq: Four times a day (QID) | RESPIRATORY_TRACT | Status: DC | PRN

## 2022-05-18 MED ORDER — DEXTROSE 50 % IV SOLN: 0.0000 mL | INTRAVENOUS | Status: DC | PRN

## 2022-05-18 MED ORDER — DEXTROSE IN LACTATED RINGERS 5 % IV SOLN: INTRAVENOUS | Status: DC

## 2022-05-18 MED ORDER — LACTATED RINGERS IV BOLUS: 1000.0000 mL | INTRAVENOUS | Status: AC

## 2022-05-18 MED ORDER — LACTATED RINGERS IV SOLN: INTRAVENOUS | Status: DC

## 2022-05-18 MED ORDER — SODIUM CHLORIDE 0.9 % IV SOLN: Freq: Once | INTRAVENOUS | Status: AC

## 2022-05-18 MED ORDER — INSULIN REGULAR(HUMAN) IN NACL 100-0.9 UT/100ML-% IV SOLN
INTRAVENOUS | Status: DC
  Administered 2022-05-18: 17 [IU]/h via INTRAVENOUS
  Filled 2022-05-18: qty 100

## 2022-05-18 MED ORDER — ONDANSETRON HCL 4 MG/2ML IJ SOLN
4.0000 mg | Freq: Once | INTRAMUSCULAR | Status: AC
  Administered 2022-05-18: 4 mg via INTRAVENOUS
  Filled 2022-05-18: qty 2

## 2022-05-18 MED ORDER — KETOROLAC TROMETHAMINE 15 MG/ML IJ SOLN
15.0000 mg | Freq: Once | INTRAMUSCULAR | Status: AC
  Administered 2022-05-18: 15 mg via INTRAVENOUS
  Filled 2022-05-18: qty 1

## 2022-05-18 MED ORDER — ENOXAPARIN SODIUM 40 MG/0.4ML IJ SOSY
40.0000 mg | PREFILLED_SYRINGE | INTRAMUSCULAR | Status: DC
  Administered 2022-05-18: 40 mg via SUBCUTANEOUS
  Filled 2022-05-18: qty 0.4

## 2022-05-18 NOTE — H&P (Signed)
History and Physical    Patient: Jerome Murphy LHT:[id number] DOB: July 07, 1991 DOA: 05/18/2022 DOS: the patient was seen and examined on 05/18/2022 PCP: Storm Frisk, MD  Patient coming from: Home  Chief Complaint:  Chief Complaint  Patient presents with   Emesis   HPI: Jerome Murphy is a 31 y.o. male with medical history significant of type 2 diabetes with peripheral neuropathy presenting to the ED with complaints of nausea and vomiting.  [name] states he began to feel ill on 11/4 after taking his kids to a parade.  At this time, he endorses diffuse body aches and generalized malaise.  On 11/5, he began to develop nausea with multiple episodes of nonbloody, nonmelanotic emesis.  He recalls numerous episodes of emesis on 11/5 extending into the day on 11/6.  He denies any bowel movements over the last 4 to 5 days, but states he has been passing gas.  He was initially experiencing generalized abdominal pain but is no longer experiencing any pain at this time.  He denies any fevers, chills, palpitations, chest pain, shortness of breath, dizziness, vision changes.  He denies any sick contacts at home.  [name] states he was diagnosed with type 2 diabetes approximately 2 to 3 years ago at which point he was significantly overweight.  Since being diagnosed, he is lost over 100 pounds after starting a more active job.  He has not followed up with his PCP in a while and so he uses over-the-counter Novolin, between 15 to 20 units twice daily.  He occasionally may miss a dose on the weekends only.  He notes that his home sugars are usually between 1 90-200.  ED course: On arrival to the ED, patient was afebrile at 98.5 with blood pressure 130/89 and heart rate of 79.  Initial blood work remarkable for sodium of 133, potassium of 5.2, bicarb of 15 and glucose of 332 with elevated anion gap at 15.  VBG was obtained that demonstrated decreased pH of 7.1.  Patient  started on DKA protocol with IV insulin.  TR[facility] contacted for admission.  Review of Systems: As mentioned in the history of present illness. All other systems reviewed and are negative. Past Medical History:  Diagnosis Date   Asthma    Diabetes mellitus without complication (HCC)    History of gallstones    Narcolepsy    Seizures (HCC)    Past Surgical History:  Procedure Laterality Date   CIRCUMCISION     HERNIA REPAIR     TONSILLECTOMY     Social History:  reports that he has been smoking cigarettes. He has been smoking an average of .25 packs per day. He has never used smokeless tobacco. He reports that he does not drink alcohol and does not use drugs.  Allergies  Allergen Reactions   Robitussin [Guaifenesin] Hives and Itching    Family History  Problem Relation Age of Onset   Diabetes Mother    Diabetes Maternal Uncle     Prior to Admission medications   Medication Sig Start Date End Date Taking? Authorizing Provider  insulin regular (NOVOLIN R) 100 units/mL injection Inject 15 Units into the skin 2 (two) times daily before a meal. 20 u if without metformin   Yes [provider]  metFORMIN (GLUCOPHAGE-XR) 750 MG 24 hr tablet Take 1 tablet (750 mg total) by mouth in the morning and at bedtime. Patient taking differently: Take 500 mg by mouth in the morning and at bedtime. 08/13/21  Yes Storm Frisk, MD  albuterol (VENTOLIN HFA) 108 (90 Base) MCG/ACT inhaler Inhale 2 puffs into the lungs every 6 (six) hours as needed for wheezing or shortness of breath. 02/10/21   Nita Sickle, MD  atorvastatin (LIPITOR) 20 MG tablet Take 1 tablet (20 mg total) by mouth daily. Patient not taking: Reported on 06/26/2021 01/16/21   Storm Frisk, MD  gabapentin (NEURONTIN) 300 MG capsule Take 1 capsule (300 mg total) by mouth at bedtime. Patient not taking: Reported on 05/18/2022 06/26/21   Mayers, Cari S, PA-C  Insulin NPH, Human,, Isophane, (HUMULIN N KWIKPEN) 100 UNIT/ML  Kiwkpen Inject 12 Units into the skin in the morning and at bedtime. Patient not taking: Reported on 05/18/2022 03/03/22   Storm Frisk, MD  ondansetron (ZOFRAN ODT) 4 MG disintegrating tablet Take 1 tablet (4 mg total) by mouth every 8 (eight) hours as needed. 03/26/21   Nita Sickle, MD  predniSONE (DELTASONE) 20 MG tablet Take 3 tablets by  mouth daily with breakfast for 4 days. Patient not taking: Reported on 05/18/2022 02/10/21   Nita Sickle, MD    Physical Exam: Vitals:   05/18/22 1208 05/18/22 1213 05/18/22 1520  BP:  130/89 121/68  Pulse:  79 85  Resp:  20 20  Temp:  98.5 F (36.9 C)   TempSrc:  Oral   SpO2:  99% 98%  Weight: 79.4 kg    Height: 5\' 11"  (1.803 m)     Physical Exam Vitals and nursing note reviewed.  Constitutional:      General: He is not in acute distress.    Appearance: He is normal weight. He is not toxic-appearing.  HENT:     Head: Normocephalic and atraumatic.     Mouth/Throat:     Mouth: Mucous membranes are moist.     Pharynx: Oropharynx is clear.  Eyes:     Conjunctiva/sclera: Conjunctivae normal.     Pupils: Pupils are equal, round, and reactive to light.  Cardiovascular:     Rate and Rhythm: Regular rhythm. Tachycardia present.     Heart sounds: No murmur heard.    No gallop.  Pulmonary:     Effort: Pulmonary effort is normal. No respiratory distress.     Breath sounds: Normal breath sounds. No wheezing or rales.  Abdominal:     General: Bowel sounds are absent. There is no distension.     Palpations: Abdomen is soft.     Tenderness: There is no abdominal tenderness. There is no guarding.  Musculoskeletal:     Right lower leg: No edema.     Left lower leg: No edema.  Skin:    General: Skin is warm and dry.  Neurological:     General: No focal deficit present.     Mental Status: He is alert and oriented to person, place, and time. Mental status is at baseline.  Psychiatric:        Mood and Affect: Mood normal.         Behavior: Behavior normal.        Thought Content: Thought content normal.        Judgment: Judgment normal.    Data Reviewed: CBC with no leukocytosis, anemia or thrombocytopenia.  CMP with sodium of 133, potassium of 5.2, bicarb of 15, glucose of 332, with elevated anion gap at 15.  Creatinine normal at 1.08.  Lipase within normal limits.  VBG with pH of 7.1.  Repeat BMP this afternoon with potassium of 4.0, bicarb  of 15 glucose of 171 and anion gap of 9.  Beta hydroxy acid, A1c pending  Results are pending, will review when available.  Assessment and Plan: * DKA (diabetic ketoacidosis) ([place]) Patient presenting with approximately 24-hour history of generalized malaise, body aches and nausea with nonbloody vomiting.  Initial blood work consistent with DKA in the setting of anion gap metabolic acidosis with pH of 7.1.  Reason for DKA undetermined at this time, as patient has not experienced any recent medication changes or illnesses.    - Start IV insulin per Endo tool - S/p 2 L of IV fluid resuscitation - Continue LR at 125 with plan to switch to D5-LR 1 CBG below 250 - BMP every 4 hours and beta hydroxy acid every 8 hours - Will monitor for need of potassium resuscitation - N.p.o.  Type 2 diabetes mellitus ([place]) Patient has a history of type 2 diabetes diagnosed in 2019 per chart review with admission in 2021 for DKA with A1c of 15.2%.  Per patient, he was approximately 250 pounds when he was initially diagnosed, but has lost over 100 pounds after starting an active job.It is possible his weight loss is in the setting of uncontrolled diabetes, however I cannot see that if antibodies have been checked in the past for type I.  We will obtain at this time.  - Anti-GAD and insulin antibodies pending - Insulin and C-peptide pending - Hyperglycemia management as noted above  Sinus tachycardia Patient presenting with tachycardia likely multifactorial in the setting of DKA and significant  daily caffeine intake.  Patient states he drinks 2-3 Celsius drinks per day.  - EKG pending - Telemetry monitoring   Advance Care Planning:   Code Status: Full Code   Consults: None  Family Communication: No family at bedside to update.  Patient stated he will update his family.  Severity of Illness: The appropriate patient status for this patient is INPATIENT. Inpatient status is judged to be reasonable and necessary in order to provide the required intensity of service to ensure the patient's safety. The patient's presenting symptoms, physical exam findings, and initial radiographic and laboratory data in the context of their chronic comorbidities is felt to place them at high risk for further clinical deterioration. Furthermore, it is not anticipated that the patient will be medically stable for discharge from the hospital within 2 midnights of admission.   * I certify that at the point of admission it is my clinical judgment that the patient will require inpatient hospital care spanning beyond 2 midnights from the point of admission due to high intensity of service, high risk for further deterioration and high frequency of surveillance required.*  Author: Jose Persia, MD 05/18/2022 4:11 PM  For on call review [email or web address].

## 2022-05-18 NOTE — ED Notes (Addendum)
NP and pt out of room at this time with NP providing directions on way out of ED. Pt ambulatory with steady gait, no distress noted. Per NP, pt left against medical advice and had previously signed AMA form with assigned nurse.

## 2022-05-18 NOTE — ED Notes (Signed)
Neomia Glass NP at bedside speaking with patient.

## 2022-05-18 NOTE — ED Notes (Signed)
BG 93, notified provider - started new IV fluids and paused insulin.  Patient alert and oriented x4, no shakiness or clamminess noted.

## 2022-05-18 NOTE — Progress Notes (Signed)
       CROSS COVER NOTE  NAME: Jerome Murphy MRN: [id number] DOB : [birth year] ATTENDING PHYSICIAN: Jose Persia, MD    Date of Service   05/18/2022   HPI/Events of Note   Medication request received for patient report of headache while NPO.  Interventions   Assessment/Plan:  Toradol     This document was prepared using Dragon voice recognition software and may include unintentional dictation errors.  Neomia Glass DNP, MBA, FNP-BC Nurse Practitioner [facility] Pager [phone]

## 2022-05-18 NOTE — ED Notes (Signed)
Pt told me that he wanted to leave AMA. I advised Pt of the dangers of abruptly stopping his insulin drip before reaching therapeutic range. Pt was told that he would run the risk for reverting back into a DKA state. Pt's family member agreed that he should stay. Pt verbalized frustration of not being able to eat. I explained that after the BMP that I just drew resulted I would speak to the on call provider about allowing him to eat something. Pt agreed to this.

## 2022-05-18 NOTE — ED Triage Notes (Signed)
Here from work, went to work not feeling well, c/o NV, stomach unsettled, general weakness, fatigue. Onset 2d ago after eating food at [facility]. Last BM 2d ago. No relief with gas-x last night. Endorses malodorous gas. Denies fever or bleeding. Emesis x3 in last 24 hrs, undigested food, then clear. Some abd discomfort.

## 2022-05-18 NOTE — Progress Notes (Signed)
    NAME: Jerome Murphy MRN: [id number] DOB : 1991/02/15                                                                         Against Medical Advice   Patient at this time expresses desire to leave the Hospital immediately, patient has been warned that this is not Medically advisable at this time, and can result in Medical complications like Death and Disability. Specifically discussed his critical illness and sequelae of untreated DKA. Patient reports frustration with being unable to eat and receiving inconsistent messages throughout the day on when he would be able to eat. I explained that I was waiting on one lab result that should be back within an hour and hopefully I could order a diet then. [name] states he's unable to wait the hour. Patient has full decision making capacity and understands and accepts the risks involved and assumes full responsibilty of this decision.  This patient has also been advised that if they feel the need for further medical assistance to return to the closest ER or dial 9-1-1.   This document was prepared using Dragon voice recognition software and may include unintentional dictation errors.  Neomia Glass DNP, MBA, FNP-BC Nurse Practitioner [facility] Pager [phone]

## 2022-05-18 NOTE — Inpatient Diabetes Management (Signed)
Inpatient Diabetes Program Recommendations  AACE/ADA: New Consensus Statement on Inpatient Glycemic Control (2015)  Target Ranges:  Prepandial:   less than 140 mg/dL      Peak postprandial:   less than 180 mg/dL (1-2 hours)      Critically ill patients:  140 - 180 mg/dL   Lab Results  Component Value Date   GLUCAP 254 (H) 05/18/2022   HGBA1C 13.9 (A) 06/26/2021    Latest Reference Range & Units 05/18/22 12:24  Sodium 135 - 145 mmol/L 133 (L)  Potassium 3.5 - 5.1 mmol/L 5.2 (H)  Chloride 98 - 111 mmol/L 103  CO2 22 - 32 mmol/L 15 (L)  Glucose 70 - 99 mg/dL 332 (H)  BUN 6 - 20 mg/dL 14  Creatinine 0.61 - 1.24 mg/dL 1.08  Calcium 8.9 - 10.3 mg/dL 9.0  Anion gap 5 - 15  15  (L): Data is abnormally low (H): Data is abnormally high  Review of Glycemic Control  Diabetes history: DM2 Outpatient Diabetes medications: Humulin N 12 units bid (listed as not taking), Novolin R 15 units bid ac meals, Metformin 500 mg bid Current orders for Inpatient glycemic control: IV insulin per endotool for DKA  Inpatient Diabetes Program Recommendations:   Patient currently in ED. Noted pending A1c, patient admitted in DKA listed as not taking Humulin N prescription. Agree with IV insulin and will follow during hospitalization.  Thank you, Nani Gasser. Jackquline Branca, RN, MSN, CDE  Diabetes Coordinator Inpatient Glycemic Control Team Team Pager [phone] (8am-5pm) 05/18/2022 2:40 PM

## 2022-05-18 NOTE — ED Provider Notes (Signed)
[facility] Provider Note    Event Date/Time   First MD Initiated Contact with Patient 05/18/22 1234     (approximate)   History   Emesis   HPI  Jerome Murphy is a 31 y.o. male with history of diabetes who presents with complaints of nausea vomiting and abdominal cramping.  He reports his symptom onset on Sunday.  He denies fevers or chills.  No alcohol use.  Normal stools.  No sick contacts reported.     Physical Exam   Triage Vital Signs: ED Triage Vitals  Enc Vitals Group     BP 05/18/22 1213 130/89     Pulse Rate 05/18/22 1213 79     Resp 05/18/22 1213 20     Temp 05/18/22 1213 98.5 F (36.9 C)     Temp Source 05/18/22 1213 Oral     SpO2 05/18/22 1213 99 %     Weight 05/18/22 1208 79.4 kg (175 lb)     Height 05/18/22 1208 1.803 m (5\' 11" )     Head Circumference --      Peak Flow --      Pain Score 05/18/22 1208 10     Pain Loc --      Pain Edu? --      Excl. in GC? --     Most recent vital signs: Vitals:   05/18/22 1213  BP: 130/89  Pulse: 79  Resp: 20  Temp: 98.5 F (36.9 C)  SpO2: 99%     General: Awake, no distress.  CV:  Good peripheral perfusion.  Resp:  Normal effort.  Clear to auscultation bilaterally Abd:  No distention.  Soft, nontender Other:      ED Results / Procedures / Treatments   Labs (all labs ordered are listed, but only abnormal results are displayed) Labs Reviewed  URINALYSIS, ROUTINE W REFLEX MICROSCOPIC - Abnormal; Notable for the following components:      Result Value   Color, Urine YELLOW (*)    APPearance CLEAR (*)    Glucose, UA >=500 (*)    Ketones, ur 80 (*)    Protein, ur 100 (*)    All other components within normal limits  CBC WITH DIFFERENTIAL/PLATELET - Abnormal; Notable for the following components:   RDW 11.3 (*)    Eosinophils Absolute 0.6 (*)    All other components within normal limits  COMPREHENSIVE METABOLIC PANEL - Abnormal; Notable for the following  components:   Sodium 133 (*)    Potassium 5.2 (*)    CO2 15 (*)    Glucose, Bld 332 (*)    Total Protein 8.8 (*)    AST 14 (*)    All other components within normal limits  BLOOD GAS, VENOUS - Abnormal; Notable for the following components:   pH, Ven 7.14 (*)    pCO2, Ven 37 (*)    Bicarbonate 12.6 (*)    Acid-base deficit 15.6 (*)    All other components within normal limits  CBG MONITORING, ED - Abnormal; Notable for the following components:   Glucose-Capillary 337 (*)    All other components within normal limits  LIPASE, BLOOD  CBG MONITORING, ED     EKG     RADIOLOGY     PROCEDURES:  Critical Care performed: yes  CRITICAL CARE Performed by: 13/06/23   Total critical care time: 30 minutes  Critical care time was exclusive of separately billable procedures and treating other patients.  Critical care  was necessary to treat or prevent imminent or life-threatening deterioration.  Critical care was time spent personally by me on the following activities: development of treatment plan with patient and/or surrogate as well as nursing, discussions with consultants, evaluation of patient's response to treatment, examination of patient, obtaining history from patient or surrogate, ordering and performing treatments and interventions, ordering and review of laboratory studies, ordering and review of radiographic studies, pulse oximetry and re-evaluation of patient's condition.   Procedures   MEDICATIONS ORDERED IN ED: Medications  insulin regular, human (MYXREDLIN) 100 units/ 100 mL infusion (17 Units/hr Intravenous New Bag/Given 05/18/22 1312)  lactated ringers infusion (has no administration in time range)  dextrose 5 % in lactated ringers infusion (0 mLs Intravenous Hold 05/18/22 1314)  dextrose 50 % solution 0-50 mL (has no administration in time range)  ondansetron (ZOFRAN) injection 4 mg (4 mg Intravenous Given 05/18/22 1227)  0.9 %  sodium chloride  infusion ( Intravenous New Bag/Given 05/18/22 1254)     IMPRESSION / MDM / ASSESSMENT AND PLAN / ED COURSE  I reviewed the triage vital signs and the nursing notes. Patient's presentation is most consistent with acute presentation with potential threat to life or bodily function.   Patient presents with nausea vomiting abdominal discomfort as noted above.  Vital signs are overall reassuring however he looks uncomfortable  Noted to have elevated glucose.  Differential includes gastritis, gastroenteritis, DKA  Notified of pH of 7.13, low bicarb as well consistent with DKA.  IV fluid bolus given, insulin drip ordered, discussed with the hospitalist for admission      FINAL CLINICAL IMPRESSION(S) / ED DIAGNOSES   Final diagnoses:  Diabetic ketoacidosis without coma associated with other specified diabetes mellitus ([place])     Rx / DC Orders   ED Discharge Orders     None        Note:  This document was prepared using Dragon voice recognition software and may include unintentional dictation errors.   Lavonia Drafts, MD 05/18/22 1325

## 2022-05-18 NOTE — Assessment & Plan Note (Signed)
Patient presenting with tachycardia likely multifactorial in the setting of DKA and significant daily caffeine intake.  Patient states he drinks 2-3 Celsius drinks per day.  - EKG pending - Telemetry monitoring

## 2022-05-18 NOTE — ED Notes (Signed)
Patient signed AMA form. Patient is AOX4 with no signs of confusion. Verbalized understanding that leaving against medical advice could result in death. Requesting to speak with provider before leaving. K.Foust notified via secure chat.

## 2022-05-18 NOTE — ED Notes (Signed)
Patient verbalizes frustration with being unable to eat. Reports headache. States he is going to leave if he is unable to eat. Patient updated on plan of care and rationale behind NPO status. Jerome Murphy notified via secure chat of patient's statement and report of pain. No PRN pain medication is currently ordered. Med requested.

## 2022-05-18 NOTE — ED Notes (Addendum)
Pt became aggravated and removed the left peripheral IV stating that he is leaving AMA and that he wants [place] to bring him the forms. Valetta Fuller Foust notified via secure chat. Patient alert, oriented x 4 with no confusion. Pt signed AMA forms, and awaiting to speak with NP.K. Foust notified.

## 2022-05-18 NOTE — Assessment & Plan Note (Addendum)
Patient presenting with approximately 24-hour history of generalized malaise, body aches and nausea with nonbloody vomiting.  Initial blood work consistent with DKA in the setting of anion gap metabolic acidosis with pH of 7.1.  Reason for DKA undetermined at this time, as patient has not experienced any recent medication changes or illnesses.    - Start IV insulin per Endo tool - S/p 2 L of IV fluid resuscitation - Continue LR at 125 with plan to switch to D5-LR 1 CBG below 250 - BMP every 4 hours and beta hydroxy acid every 8 hours - Will monitor for need of potassium resuscitation - N.p.o.

## 2022-05-18 NOTE — Assessment & Plan Note (Addendum)
Patient has a history of type 2 diabetes diagnosed in 2019 per chart review with admission in 2021 for DKA with A1c of 15.2%.  Per patient, he was approximately 250 pounds when he was initially diagnosed, but has lost over 100 pounds after starting an active job.It is possible his weight loss is in the setting of uncontrolled diabetes, however I cannot see that if antibodies have been checked in the past for type I.  We will obtain at this time.  - Anti-GAD and insulin antibodies pending - Insulin and C-peptide pending - Hyperglycemia management as noted above

## 2022-05-18 NOTE — ED Notes (Signed)
Patient states he is going home. Pulled IV from his arm. K. Foust notified.

## 2022-05-20 LAB — INSULIN AND C-PEPTIDE, SERUM
C-Peptide: 0.4 ng/mL — ABNORMAL LOW (ref 1.1–4.4)
Insulin: 58.5 u[IU]/mL — ABNORMAL HIGH (ref 2.6–24.9)

## 2022-05-21 LAB — GLUTAMIC ACID DECARBOXYLASE AUTO ABS: Glutamic Acid Decarb Ab: 1013.1 U/mL — ABNORMAL HIGH (ref 0.0–5.0)

## 2022-05-24 LAB — BLOOD GAS, VENOUS
Acid-base deficit: 15.6 mmol/L — ABNORMAL HIGH (ref 0.0–2.0)
Bicarbonate: 12.6 mmol/L — ABNORMAL LOW (ref 20.0–28.0)
O2 Saturation: 58.1 %
Patient temperature: 37
pCO2, Ven: 37 mmHg — ABNORMAL LOW (ref 44–60)
pH, Ven: 7.14 — CL (ref 7.25–7.43)
pO2, Ven: 37 mmHg (ref 32–45)

## 2022-05-25 LAB — INSULIN ANTIBODIES, BLOOD: Insulin Antibodies, Human: 46 uU/mL — ABNORMAL HIGH

## 2022-07-09 ENCOUNTER — Other Ambulatory Visit: Payer: Self-pay

## 2022-07-09 ENCOUNTER — Observation Stay
Admission: EM | Admit: 2022-07-09 | Discharge: 2022-07-10 | Disposition: A | Discharge status: Home / Self Care | Payer: Medicaid Other | Attending: Internal Medicine | Admitting: Internal Medicine

## 2022-07-09 DIAGNOSIS — F17210 Nicotine dependence, cigarettes, uncomplicated: Secondary | ICD-10-CM | POA: Diagnosis not present

## 2022-07-09 DIAGNOSIS — Z79899 Other long term (current) drug therapy: Secondary | ICD-10-CM | POA: Insufficient documentation

## 2022-07-09 DIAGNOSIS — Z1152 Encounter for screening for COVID-19: Secondary | ICD-10-CM | POA: Insufficient documentation

## 2022-07-09 DIAGNOSIS — E1110 Type 2 diabetes mellitus with ketoacidosis without coma: Secondary | ICD-10-CM | POA: Diagnosis not present

## 2022-07-09 DIAGNOSIS — J45909 Unspecified asthma, uncomplicated: Secondary | ICD-10-CM | POA: Insufficient documentation

## 2022-07-09 DIAGNOSIS — E785 Hyperlipidemia, unspecified: Secondary | ICD-10-CM | POA: Diagnosis present

## 2022-07-09 DIAGNOSIS — Z794 Long term (current) use of insulin: Secondary | ICD-10-CM | POA: Diagnosis not present

## 2022-07-09 DIAGNOSIS — F17200 Nicotine dependence, unspecified, uncomplicated: Secondary | ICD-10-CM

## 2022-07-09 DIAGNOSIS — R112 Nausea with vomiting, unspecified: Secondary | ICD-10-CM | POA: Diagnosis present

## 2022-07-09 DIAGNOSIS — J4520 Mild intermittent asthma, uncomplicated: Secondary | ICD-10-CM

## 2022-07-09 DIAGNOSIS — Z7984 Long term (current) use of oral hypoglycemic drugs: Secondary | ICD-10-CM | POA: Diagnosis not present

## 2022-07-09 DIAGNOSIS — E1010 Type 1 diabetes mellitus with ketoacidosis without coma: Secondary | ICD-10-CM

## 2022-07-09 DIAGNOSIS — I10 Essential (primary) hypertension: Secondary | ICD-10-CM | POA: Diagnosis not present

## 2022-07-09 DIAGNOSIS — E1065 Type 1 diabetes mellitus with hyperglycemia: Secondary | ICD-10-CM

## 2022-07-09 DIAGNOSIS — E119 Type 2 diabetes mellitus without complications: Secondary | ICD-10-CM

## 2022-07-09 LAB — COMPREHENSIVE METABOLIC PANEL
ALT: 12 U/L (ref 0–44)
AST: 16 U/L (ref 15–41)
Albumin: 4.2 g/dL (ref 3.5–5.0)
Alkaline Phosphatase: 74 U/L (ref 38–126)
Anion gap: 19 — ABNORMAL HIGH (ref 5–15)
BUN: 11 mg/dL (ref 6–20)
CO2: 14 mmol/L — ABNORMAL LOW (ref 22–32)
Calcium: 9 mg/dL (ref 8.9–10.3)
Chloride: 100 mmol/L (ref 98–111)
Creatinine, Ser: 0.95 mg/dL (ref 0.61–1.24)
GFR, Estimated: >60 mL/min (ref >60)
Glucose, Bld: 336 mg/dL — ABNORMAL HIGH (ref 70–99)
Potassium: 4.3 mmol/L (ref 3.5–5.1)
Sodium: 133 mmol/L — ABNORMAL LOW (ref 135–145)
Total Bilirubin: 1.6 mg/dL — ABNORMAL HIGH (ref 0.3–1.2)
Total Protein: 7.5 g/dL (ref 6.5–8.1)

## 2022-07-09 LAB — CBC
HCT: 48.8 % (ref 39.0–52.0)
Hemoglobin: 16.9 g/dL (ref 13.0–17.0)
MCH: 32.3 pg (ref 26.0–34.0)
MCHC: 34.6 g/dL (ref 30.0–36.0)
MCV: 93.3 fL (ref 80.0–100.0)
Platelets: 168 10*3/uL (ref 150–400)
RBC: 5.23 MIL/uL (ref 4.22–5.81)
RDW: 11.8 % (ref 11.5–15.5)
WBC: 5 10*3/uL (ref 4.0–10.5)
nRBC: 0 % (ref 0.0–0.2)

## 2022-07-09 LAB — CBG MONITORING, ED
Glucose-Capillary: 345 mg/dL — ABNORMAL HIGH (ref 70–99)
Glucose-Capillary: 318 mg/dL — ABNORMAL HIGH (ref 70–99)
Glucose-Capillary: 217 mg/dL — ABNORMAL HIGH (ref 70–99)
Glucose-Capillary: 215 mg/dL — ABNORMAL HIGH (ref 70–99)
Glucose-Capillary: 181 mg/dL — ABNORMAL HIGH (ref 70–99)
Glucose-Capillary: 162 mg/dL — ABNORMAL HIGH (ref 70–99)
Glucose-Capillary: 141 mg/dL — ABNORMAL HIGH (ref 70–99)
Glucose-Capillary: 121 mg/dL — ABNORMAL HIGH (ref 70–99)
Glucose-Capillary: 116 mg/dL — ABNORMAL HIGH (ref 70–99)
Glucose-Capillary: 119 mg/dL — ABNORMAL HIGH (ref 70–99)
Glucose-Capillary: 128 mg/dL — ABNORMAL HIGH (ref 70–99)

## 2022-07-09 LAB — LIPASE, BLOOD: Lipase: 25 U/L (ref 11–51)

## 2022-07-09 LAB — BASIC METABOLIC PANEL
Anion gap: 6 (ref 5–15)
BUN: 9 mg/dL (ref 6–20)
CO2: 20 mmol/L — ABNORMAL LOW (ref 22–32)
Calcium: 8.2 mg/dL — ABNORMAL LOW (ref 8.9–10.3)
Chloride: 109 mmol/L (ref 98–111)
Creatinine, Ser: 0.61 mg/dL (ref 0.61–1.24)
GFR, Estimated: >60 mL/min (ref >60)
Glucose, Bld: 162 mg/dL — ABNORMAL HIGH (ref 70–99)
Potassium: 3.7 mmol/L (ref 3.5–5.1)
Sodium: 135 mmol/L (ref 135–145)

## 2022-07-09 LAB — RESP PANEL BY RT-PCR (RSV, FLU A&B, COVID)  RVPGX2
Influenza A by PCR: NEGATIVE
Influenza B by PCR: NEGATIVE
Resp Syncytial Virus by PCR: NEGATIVE
SARS Coronavirus 2 by RT PCR: NEGATIVE

## 2022-07-09 LAB — BETA-HYDROXYBUTYRIC ACID: Beta-Hydroxybutyric Acid: 7.7 mmol/L — ABNORMAL HIGH (ref 0.05–0.27)

## 2022-07-09 MED ORDER — DEXTROSE 50 % IV SOLN: 0.0000 mL | INTRAVENOUS | Status: DC | PRN

## 2022-07-09 MED ORDER — INSULIN REGULAR(HUMAN) IN NACL 100-0.9 UT/100ML-% IV SOLN: INTRAVENOUS | Status: DC

## 2022-07-09 MED ORDER — INSULIN ASPART 100 UNIT/ML IJ SOLN: 3.0000 [IU] | Freq: Three times a day (TID) | INTRAMUSCULAR | Status: DC

## 2022-07-09 MED ORDER — ACETAMINOPHEN 325 MG PO TABS: 650.0000 mg | ORAL_TABLET | Freq: Four times a day (QID) | ORAL | Status: DC | PRN

## 2022-07-09 MED ORDER — SODIUM CHLORIDE 0.9 % IV BOLUS: 1000.0000 mL | Freq: Once | INTRAVENOUS | Status: DC

## 2022-07-09 MED ORDER — DEXTROSE IN LACTATED RINGERS 5 % IV SOLN: INTRAVENOUS | Status: DC

## 2022-07-09 MED ORDER — INSULIN ASPART 100 UNIT/ML IJ SOLN: 0.0000 [IU] | Freq: Every day | INTRAMUSCULAR | Status: DC

## 2022-07-09 MED ORDER — SODIUM CHLORIDE 0.9 % IV BOLUS
1000.0000 mL | Freq: Once | INTRAVENOUS | Status: AC
  Administered 2022-07-09: 1000 mL via INTRAVENOUS

## 2022-07-09 MED ORDER — INSULIN REGULAR(HUMAN) IN NACL 100-0.9 UT/100ML-% IV SOLN
INTRAVENOUS | Status: DC
  Administered 2022-07-09: 17 [IU]/h via INTRAVENOUS
  Filled 2022-07-09: qty 100

## 2022-07-09 MED ORDER — SODIUM CHLORIDE 0.9 % IV SOLN: Freq: Once | INTRAVENOUS | Status: AC

## 2022-07-09 MED ORDER — LACTATED RINGERS IV SOLN: INTRAVENOUS | Status: DC

## 2022-07-09 MED ORDER — INSULIN GLARGINE-YFGN 100 UNIT/ML ~~LOC~~ SOLN
10.0000 [IU] | Freq: Every day | SUBCUTANEOUS | Status: DC
  Administered 2022-07-09: 10 [IU] via SUBCUTANEOUS
  Filled 2022-07-09 (×2): qty 0.1

## 2022-07-09 MED ORDER — ALBUTEROL SULFATE (2.5 MG/3ML) 0.083% IN NEBU: 3.0000 mL | INHALATION_SOLUTION | RESPIRATORY_TRACT | Status: DC | PRN

## 2022-07-09 MED ORDER — ONDANSETRON HCL 4 MG/2ML IJ SOLN: 4.0000 mg | Freq: Three times a day (TID) | INTRAMUSCULAR | Status: DC | PRN

## 2022-07-09 MED ORDER — POTASSIUM CHLORIDE 10 MEQ/100ML IV SOLN
10.0000 meq | INTRAVENOUS | Status: AC
  Administered 2022-07-09 (×2): 10 meq via INTRAVENOUS
  Filled 2022-07-09 (×2): qty 100

## 2022-07-09 MED ORDER — ENOXAPARIN SODIUM 40 MG/0.4ML IJ SOSY: 40.0000 mg | PREFILLED_SYRINGE | INTRAMUSCULAR | Status: DC

## 2022-07-09 MED ORDER — NICOTINE 21 MG/24HR TD PT24
21.0000 mg | MEDICATED_PATCH | Freq: Every day | TRANSDERMAL | Status: DC
  Administered 2022-07-09 – 2022-07-10 (×2): 21 mg via TRANSDERMAL
  Filled 2022-07-09 (×2): qty 1

## 2022-07-09 MED ORDER — INSULIN ASPART 100 UNIT/ML IJ SOLN
0.0000 [IU] | Freq: Three times a day (TID) | INTRAMUSCULAR | Status: DC
  Administered 2022-07-10: 5 [IU] via SUBCUTANEOUS
  Filled 2022-07-09: qty 1

## 2022-07-09 MED ORDER — LIVING WELL WITH DIABETES BOOK
Freq: Once | Status: DC
  Filled 2022-07-09: qty 1

## 2022-07-09 MED ORDER — ONDANSETRON HCL 4 MG/2ML IJ SOLN
4.0000 mg | Freq: Once | INTRAMUSCULAR | Status: AC
  Administered 2022-07-09: 4 mg via INTRAVENOUS
  Filled 2022-07-09: qty 2

## 2022-07-09 NOTE — ED Triage Notes (Addendum)
C/O vomiting x 24 hours.  States everyone in the home is sick with the same symptoms.

## 2022-07-09 NOTE — H&P (Addendum)
History and Physical    Jerome Murphy ZOX:[id number] DOB: 04-20-1991 DOA: 07/09/2022  Referring MD/NP/PA:   PCP: Storm Frisk, MD   Patient coming from:  The patient is coming from home.  At baseline, pt is independent for most of ADL.        Chief Complaint: Nausea, vomiting, diarrhea  HPI: Jerome Murphy is a 31 y.o. male with medical history significant of hypertension, diabetes mellitus, asthma, tobacco abuse, who presents with nausea, vomiting, diarrhea.  Patient states that all family members are sick with GI symptoms in the past several days.  He started having nausea, vomiting, diarrhea since yesterday.  He has vomited more than 10 times with nonbilious nonbloody vomiting.  He had 2 episode of diarrhea yesterday, which has resolved.  Currently no active diarrhea.  Patient also reports some abdominal pain which is located in the upper abdomen, cramping, mild to moderate, nonradiating.  No fever or chills.  No chest pain, cough, shortness breath.  No symptoms of UTI.  He states that he is supposed to take NPH insulin, metformin, but he did not take NPH insulin consistently since his make him losing weight.   Data reviewed independently and ED Course: pt was found to have DKA (blood sugar 336, bicarbonate 14, anion gap 19, beta hydroxybutyric acid 7.70), WBC 5.0, lipase 25, negative COVID PCR and flu PCR, electrolytes and renal function okay.  Temperature normal, blood pressure 117/86, heart rate 107, RR 20, oxygen saturation 100% on room air.  Patient is placed on stepdown bed for observation.   EKG:  Not done in ED, will get one.     Review of Systems:   General: no fevers, chills, no body weight gain, has poor appetite, has fatigue HEENT: no blurry vision, hearing changes or sore throat Respiratory: no dyspnea, coughing, wheezing CV: no chest pain, no palpitations GI: has nausea, vomiting, abdominal pain, diarrhea, no constipation GU: no dysuria,  burning on urination, increased urinary frequency, hematuria  Ext: no leg edema Neuro: no unilateral weakness, numbness, or tingling, no vision change or hearing loss Skin: no rash, no skin tear. MSK: No muscle spasm, no deformity, no limitation of range of movement in spin Heme: No easy bruising.  Travel history: No recent long distant travel.   Allergy:  Allergies  Allergen Reactions   Robitussin [Guaifenesin] Hives and Itching    Past Medical History:  Diagnosis Date   Asthma    Diabetes mellitus without complication (HCC)    History of gallstones    Narcolepsy    Seizures (HCC)     Past Surgical History:  Procedure Laterality Date   CIRCUMCISION     HERNIA REPAIR     TONSILLECTOMY      Social History:  reports that he has been smoking cigarettes. He has been smoking an average of .25 packs per day. He has never used smokeless tobacco. He reports that he does not drink alcohol and does not use drugs.  Family History:  Family History  Problem Relation Age of Onset   Diabetes Mother    Diabetes Maternal Uncle      Prior to Admission medications   Medication Sig Start Date End Date Taking? Authorizing Provider  albuterol (VENTOLIN HFA) 108 (90 Base) MCG/ACT inhaler Inhale 2 puffs into the lungs every 6 (six) hours as needed for wheezing or shortness of breath. 02/10/21   Nita Sickle, MD  atorvastatin (LIPITOR) 20 MG tablet Take 1 tablet (20 mg total) by mouth  daily. Patient not taking: Reported on 06/26/2021 01/16/21   Storm Frisk, MD  gabapentin (NEURONTIN) 300 MG capsule Take 1 capsule (300 mg total) by mouth at bedtime. Patient not taking: Reported on 05/18/2022 06/26/21   Mayers, Cari S, PA-C  Insulin NPH, Human,, Isophane, (HUMULIN N KWIKPEN) 100 UNIT/ML Kiwkpen Inject 12 Units into the skin in the morning and at bedtime. Patient not taking: Reported on 05/18/2022 03/03/22   Storm Frisk, MD  insulin regular (NOVOLIN R) 100 units/mL injection Inject 15  Units into the skin 2 (two) times daily before a meal. 20 u if without metformin    [provider]  metFORMIN (GLUCOPHAGE-XR) 750 MG 24 hr tablet Take 1 tablet (750 mg total) by mouth in the morning and at bedtime. Patient taking differently: Take 500 mg by mouth in the morning and at bedtime. 08/13/21   Storm Frisk, MD  ondansetron (ZOFRAN ODT) 4 MG disintegrating tablet Take 1 tablet (4 mg total) by mouth every 8 (eight) hours as needed. 03/26/21   Nita Sickle, MD  predniSONE (DELTASONE) 20 MG tablet Take 3 tablets by  mouth daily with breakfast for 4 days. Patient not taking: Reported on 05/18/2022 02/10/21   Nita Sickle, MD    Physical Exam: Vitals:   07/09/22 0816 07/09/22 0818 07/09/22 1309  BP:  117/86 112/72  Pulse:  (!) 107 72  Resp:  20 19  Temp:  97.9 F (36.6 C)   TempSrc:  Axillary   SpO2:  100% 98%  Weight: 79 kg    Height: 5\' 11"  (1.803 m)     General: Not in acute distress.  Has dry mucous membranes HEENT:       Eyes: PERRL, EOMI, no scleral icterus.       ENT: No discharge from the ears and nose, no pharynx injection, no tonsillar enlargement.        Neck: No JVD, no bruit, no mass felt. Heme: No neck lymph node enlargement. Cardiac: S1/S2, RRR, No murmurs, No gallops or rubs. Respiratory: No rales, wheezing, rhonchi or rubs. GI: Soft, nondistended, mild tenderness in the upper abdomen, no rebound pain, no organomegaly, BS present. GU: No hematuria Ext: No pitting leg edema bilaterally. 1+DP/PT pulse bilaterally. Musculoskeletal: No joint deformities, No joint redness or warmth, no limitation of ROM in spin. Skin: No rashes.  Neuro: Alert, oriented X3, cranial nerves II-XII grossly intact, moves all extremities normally.  Psych: Patient is not psychotic, no suicidal or hemocidal ideation.  Labs on Admission: I have personally reviewed following labs and imaging studies  CBC: Recent Labs  Lab 07/09/22 0901  WBC 5.0  HGB 16.9  HCT  48.8  MCV 93.3  PLT 168   Basic Metabolic Panel: Recent Labs  Lab 07/09/22 0901  NA 133*  K 4.3  CL 100  CO2 14*  GLUCOSE 336*  BUN 11  CREATININE 0.95  CALCIUM 9.0   GFR: Estimated Creatinine Clearance: 121.1 mL/min (by C-G formula based on SCr of 0.95 mg/dL). Liver Function Tests: Recent Labs  Lab 07/09/22 0901  AST 16  ALT 12  ALKPHOS 74  BILITOT 1.6*  PROT 7.5  ALBUMIN 4.2   Recent Labs  Lab 07/09/22 0901  LIPASE 25   No results for input(s): "AMMONIA" in the last 168 hours. Coagulation Profile: No results for input(s): "INR", "PROTIME" in the last 168 hours. Cardiac Enzymes: No results for input(s): "CKTOTAL", "CKMB", "CKMBINDEX", "TROPONINI" in the last 168 hours. BNP (last 3 results) No  results for input(s): "PROBNP" in the last 8760 hours. HbA1C: No results for input(s): "HGBA1C" in the last 72 hours. CBG: Recent Labs  Lab 07/09/22 1040 07/09/22 1150 07/09/22 1301 07/09/22 1359  GLUCAP 345* 318* 217* 215*   Lipid Profile: No results for input(s): "CHOL", "HDL", "LDLCALC", "TRIG", "CHOLHDL", "LDLDIRECT" in the last 72 hours. Thyroid Function Tests: No results for input(s): "TSH", "T4TOTAL", "FREET4", "T3FREE", "THYROIDAB" in the last 72 hours. Anemia Panel: No results for input(s): "VITAMINB12", "FOLATE", "FERRITIN", "TIBC", "IRON", "RETICCTPCT" in the last 72 hours. Urine analysis:    Component Value Date/Time   COLORURINE YELLOW (A) 05/18/2022 1223   APPEARANCEUR CLEAR (A) 05/18/2022 1223   LABSPEC 1.030 05/18/2022 1223   PHURINE 5.0 05/18/2022 1223   GLUCOSEU >=500 (A) 05/18/2022 1223   HGBUR NEGATIVE 05/18/2022 1223   BILIRUBINUR NEGATIVE 05/18/2022 1223   KETONESUR 80 (A) 05/18/2022 1223   PROTEINUR 100 (A) 05/18/2022 1223   UROBILINOGEN 1.0 03/30/2015 1850   NITRITE NEGATIVE 05/18/2022 1223   LEUKOCYTESUR NEGATIVE 05/18/2022 1223   Sepsis Labs: @LABRCNTIP (procalcitonin:4,lacticidven:4) ) Recent Results (from the past 240  hour(s))  Resp panel by RT-PCR (RSV, Flu A&B, Covid) Anterior Nasal Swab     Status: None   Collection Time: 07/09/22  8:21 AM   Specimen: Anterior Nasal Swab  Result Value Ref Range Status   SARS Coronavirus 2 by RT PCR NEGATIVE NEGATIVE Final    Comment: (NOTE) SARS-CoV-2 target nucleic acids are NOT DETECTED.  The SARS-CoV-2 RNA is generally detectable in upper respiratory specimens during the acute phase of infection. The lowest concentration of SARS-CoV-2 viral copies this assay can detect is 138 copies/mL. A negative result does not preclude SARS-Cov-2 infection and should not be used as the sole basis for treatment or other patient management decisions. A negative result may occur with  improper specimen collection/handling, submission of specimen other than nasopharyngeal swab, presence of viral mutation(s) within the areas targeted by this assay, and inadequate number of viral copies(<138 copies/mL). A negative result must be combined with clinical observations, patient history, and epidemiological information. The expected result is Negative.  Fact Sheet for Patients:  BloggerCourse.comhttps://www.fda.gov/media/152166/download  Fact Sheet for Healthcare Providers:  SeriousBroker.ithttps://www.fda.gov/media/152162/download  This test is no t yet approved or cleared by the [place] FDA and  has been authorized for detection and/or diagnosis of SARS-CoV-2 by FDA under an Emergency Use Authorization (EUA). This EUA will remain  in effect (meaning this test can be used) for the duration of the COVID-19 declaration under Section 564(b)(1) of the Act, 21 U.S.C.section 360bbb-3(b)(1), unless the authorization is terminated  or revoked sooner.       Influenza A by PCR NEGATIVE NEGATIVE Final   Influenza B by PCR NEGATIVE NEGATIVE Final    Comment: (NOTE) The Xpert Xpress SARS-CoV-2/FLU/RSV plus assay is intended as an aid in the diagnosis of influenza from Nasopharyngeal swab specimens and should not  be used as a sole basis for treatment. Nasal washings and aspirates are unacceptable for Xpert Xpress SARS-CoV-2/FLU/RSV testing.  Fact Sheet for Patients: BloggerCourse.comhttps://www.fda.gov/media/152166/download  Fact Sheet for Healthcare Providers: SeriousBroker.ithttps://www.fda.gov/media/152162/download  This test is not yet approved or cleared by the [place] FDA and has been authorized for detection and/or diagnosis of SARS-CoV-2 by FDA under an Emergency Use Authorization (EUA). This EUA will remain in effect (meaning this test can be used) for the duration of the COVID-19 declaration under Section 564(b)(1) of the Act, 21 U.S.C. section 360bbb-3(b)(1), unless the authorization is terminated or revoked.  Resp Syncytial Virus by PCR NEGATIVE NEGATIVE Final    Comment: (NOTE) Fact Sheet for Patients: BloggerCourse.com  Fact Sheet for Healthcare Providers: SeriousBroker.it  This test is not yet approved or cleared by the [place] FDA and has been authorized for detection and/or diagnosis of SARS-CoV-2 by FDA under an Emergency Use Authorization (EUA). This EUA will remain in effect (meaning this test can be used) for the duration of the COVID-19 declaration under Section 564(b)(1) of the Act, 21 U.S.C. section 360bbb-3(b)(1), unless the authorization is terminated or revoked.  Performed at [facility], [address], [place], [place] [address]      Radiological Exams on Admission: No results found.    Assessment/Plan Principal Problem:   DKA (diabetic ketoacidosis) (HCC) Active Problems:   Diabetes mellitus without complication (HCC)   HLD (hyperlipidemia)   Asthma   Nausea vomiting and diarrhea   Tobacco dependence   Assessment and Plan:  DKA (diabetic ketoacidosis) (HCC):  - Place in SDU for obs - IVF:  2L of NS bolus - start DKA protocol with BMP q4h - IVF: LR at 125 cc/h, will switch to D5-LR at 125 cc/h  when CBG<250 - replete K as needed - Zofran prn nausea  - NPO  - consult to diabetic educator  Addendum:  blood sugar is improved to 162 and AG closed at 6 Transition off insulin gtt: -will start glargine insulin 10 units daily -Let pt eat food at two hours after giving glargine insulin -Stop insulin gtt and IVF at 2 hours after giving glargin insulin -will start SSI and Novolog 3 units tid for meal coverage -will change bed to Med-surg bed  Diabetes mellitus without complication (HCC): Recent A1c 13.9, poorly controlled.  Patient is not taking insulin consistently. -on DKA protocol now  HLD (hyperlipidemia): not taking Lipitor currently -f/u with PCP  Asthma: Stable -As needed albuterol  Nausea vomiting and diarrhea: Possibly due to viral gastroenteritis.  His diarrhea has resolved.  Low suspicions for C. difficile. -Supportive care -IV fluid -As needed Zofran  Tobacco dependence -Nicotine patch    DVT ppx: SQ Lovenox  Code Status: Full code  Family Communication: not done, no family member is at bed side.     Disposition Plan:  Anticipate discharge back to previous environment  Consults called:  none  Admission status and Level of care: Stepdown:    for obs    Dispo: The patient is from: Home              Anticipated d/c is to: Home              Anticipated d/c date is: 1 day              Patient currently is not medically stable to d/c.    Severity of Illness:  The appropriate patient status for this patient is OBSERVATION. Observation status is judged to be reasonable and necessary in order to provide the required intensity of service to ensure the patient's safety. The patient's presenting symptoms, physical exam findings, and initial radiographic and laboratory data in the context of their medical condition is felt to place them at decreased risk for further clinical deterioration. Furthermore, it is anticipated that the patient will be medically stable  for discharge from the hospital within 2 midnights of admission.        Date of Service 07/09/2022    Lorretta Harp [facility]   If 7PM-7AM, please contact night-coverage [email or web address] 07/09/2022, 2:09  PM

## 2022-07-09 NOTE — ED Provider Notes (Signed)
[facility] Provider Note    Event Date/Time   First MD Initiated Contact with Patient 07/09/22 1058     (approximate)   History   Emesis   HPI  Jerome Murphy is a 31 y.o. male with a history of type 2 diabetes who presents with complaints of nausea and vomiting and upper abdominal pain.  Patient reports his whole family has been sick with nausea and vomiting and he feels that he has a viral illness.     Physical Exam   Triage Vital Signs: ED Triage Vitals  Enc Vitals Group     BP 07/09/22 0818 117/86     Pulse Rate 07/09/22 0818 (!) 107     Resp 07/09/22 0818 20     Temp 07/09/22 0818 97.9 F (36.6 C)     Temp Source 07/09/22 0818 Axillary     SpO2 07/09/22 0818 100 %     Weight 07/09/22 0816 79 kg (174 lb 2.6 oz)     Height 07/09/22 0816 1.803 m (5\' 11" )     Head Circumference --      Peak Flow --      Pain Score --      Pain Loc --      Pain Edu? --      Excl. in GC? --     Most recent vital signs: Vitals:   07/09/22 1448 07/09/22 1502  BP:  (!) 133/94  Pulse:  98  Resp:  17  Temp: 98.5 F (36.9 C)   SpO2:  98%     General: Awake, no distress.  CV:  Good peripheral perfusion.  Tachycardia noted Resp:  Normal effort.  Abd:  No distention.  Other:     ED Results / Procedures / Treatments   Labs (all labs ordered are listed, but only abnormal results are displayed) Labs Reviewed  COMPREHENSIVE METABOLIC PANEL - Abnormal; Notable for the following components:      Result Value   Sodium 133 (*)    CO2 14 (*)    Glucose, Bld 336 (*)    Total Bilirubin 1.6 (*)    Anion gap 19 (*)    All other components within normal limits  BETA-HYDROXYBUTYRIC ACID - Abnormal; Notable for the following components:   Beta-Hydroxybutyric Acid 7.70 (*)    All other components within normal limits  CBG MONITORING, ED - Abnormal; Notable for the following components:   Glucose-Capillary 345 (*)    All other components within  normal limits  CBG MONITORING, ED - Abnormal; Notable for the following components:   Glucose-Capillary 318 (*)    All other components within normal limits  CBG MONITORING, ED - Abnormal; Notable for the following components:   Glucose-Capillary 217 (*)    All other components within normal limits  CBG MONITORING, ED - Abnormal; Notable for the following components:   Glucose-Capillary 215 (*)    All other components within normal limits  CBG MONITORING, ED - Abnormal; Notable for the following components:   Glucose-Capillary 181 (*)    All other components within normal limits  RESP PANEL BY RT-PCR (RSV, FLU A&B, COVID)  [id number]  CBC  LIPASE, BLOOD  URINALYSIS, ROUTINE W REFLEX MICROSCOPIC  BLOOD GAS, VENOUS  BASIC METABOLIC PANEL  BASIC METABOLIC PANEL  BASIC METABOLIC PANEL  BASIC METABOLIC PANEL  BASIC METABOLIC PANEL     EKG     RADIOLOGY     PROCEDURES:  Critical Care performed: yes  CRITICAL CARE Performed by: Lavonia Drafts   Total critical care time: 30 minutes  Critical care time was exclusive of separately billable procedures and treating other patients.  Critical care was necessary to treat or prevent imminent or life-threatening deterioration.  Critical care was time spent personally by me on the following activities: development of treatment plan with patient and/or surrogate as well as nursing, discussions with consultants, evaluation of patient's response to treatment, examination of patient, obtaining history from patient or surrogate, ordering and performing treatments and interventions, ordering and review of laboratory studies, ordering and review of radiographic studies, pulse oximetry and re-evaluation of patient's condition.   Procedures   MEDICATIONS ORDERED IN ED: Medications  insulin regular, human (MYXREDLIN) 100 units/ 100 mL infusion (4.6 Units/hr Intravenous Rate/Dose Change 07/09/22 1601)  lactated ringers infusion (0 mLs  Intravenous Stopped 07/09/22 1300)  dextrose 5 % in lactated ringers infusion ( Intravenous New Bag/Given 07/09/22 1302)  dextrose 50 % solution 0-50 mL (has no administration in time range)  ondansetron (ZOFRAN) injection 4 mg (has no administration in time range)  acetaminophen (TYLENOL) tablet 650 mg (has no administration in time range)  nicotine (NICODERM CQ - dosed in mg/24 hours) patch 21 mg (21 mg Transdermal Patch Applied 07/09/22 1224)  albuterol (PROVENTIL) (2.5 MG/3ML) 0.083% nebulizer solution 3 mL (has no administration in time range)  enoxaparin (LOVENOX) injection 40 mg (has no administration in time range)  living well with diabetes book MISC (0 each Does not apply Hold 07/09/22 1320)  sodium chloride 0.9 % bolus 1,000 mL (has no administration in time range)  0.9 %  sodium chloride infusion (0 mLs Intravenous Stopped 07/09/22 0955)  ondansetron (ZOFRAN) injection 4 mg (4 mg Intravenous Given 07/09/22 0905)  potassium chloride 10 mEq in 100 mL IVPB (0 mEq Intravenous Stopped 07/09/22 1409)  sodium chloride 0.9 % bolus 1,000 mL (0 mLs Intravenous Stopped 07/09/22 1302)     IMPRESSION / MDM / ASSESSMENT AND PLAN / ED COURSE  I reviewed the triage vital signs and the nursing notes. Patient's presentation is most consistent with acute presentation with potential threat to life or bodily function.   Patient presents with upper abdominal discomfort nausea vomiting as noted above.  Differential includes viral gastroenteritis which is prevalent commute this time but given his history of diabetes and tachycardia also suspicious for possible DKA, have opted to obtain labs  Lab work notable for elevated glucose, CO2 of 14, beta hydroxybutyric acid is elevated as well consistent with DKA.  IV fluids given, insulin drip ordered  Discussed with the hospitalist for admission       FINAL CLINICAL IMPRESSION(S) / ED DIAGNOSES   Final diagnoses:  Diabetic ketoacidosis without coma  associated with type 2 diabetes mellitus ([place])     Rx / DC Orders   ED Discharge Orders     None        Note:  This document was prepared using Dragon voice recognition software and may include unintentional dictation errors.   Lavonia Drafts, MD 07/09/22 [phone]

## 2022-07-09 NOTE — Inpatient Diabetes Management (Signed)
Inpatient Diabetes Program Recommendations  AACE/ADA: New Consensus Statement on Inpatient Glycemic Control   Target Ranges:  Prepandial:   less than 140 mg/dL      Peak postprandial:   less than 180 mg/dL (1-2 hours)      Critically ill patients:  140 - 180 mg/dL    Latest Reference Range & Units 07/09/22 10:40 07/09/22 11:50  Glucose-Capillary 70 - 99 mg/dL 811345 (H) 914318 (H)    Latest Reference Range & Units 07/09/22 09:01  CO2 22 - 32 mmol/L 14 (L)  Glucose 70 - 99 mg/dL 782336 (H)  BUN 6 - 20 mg/dL 11  Creatinine 9.560.61 - 2.131.24 mg/dL 0.860.95  Calcium 8.9 - 57.810.3 mg/dL 9.0  Anion gap 5 - 15  19 (H)    Latest Reference Range & Units 05/18/22 20:00  Insulin Antibodies, Human uU/mL 46 (H)  Glutamic Acid Decarb Ab 0.0 - 5.0 U/mL 1,013.1 (H)  C-Peptide 1.1 - 4.4 ng/mL 0.4 (L)    Latest Reference Range & Units 05/18/22 14:28  Hemoglobin A1C 4.8 - 5.6 % 13.9 (H)   Review of Glycemic Control  Diabetes history: DM Outpatient Diabetes medications: Novolin N 25 units BID, Metformin 500 mg BID Current orders for Inpatient glycemic control: IV insulin  Inpatient Diabetes Program Recommendations:    Insulin: IV insulin should be continued until acidosis has completely resolved. Once acidosis has resolved completely and provider is ready to transition from IV to SQ insulin, please consider Semglee 20 units Q24H (based on 79 kg x 0.25 units), CBGs Q4H, Novolog 0-9 units Q4H, and Novolog 3 units TID with meals for meal coverage if patient eats at least 50% of meals.  HbgA1C:  A1C 13.9% on 05/18/22 indicating an average glucose of 352 mg/dl over the past 2-3 months.  NOTE: Patient presented to ED on 07/09/22 with complaints of N/V for last 24 hours. Lab glucose 336 mg/dl at 4:699:01 am today and labs indicate patient in DKA. IV insulin has been ordered. In reviewing chart, noted patient was inpatient 05/18/22 for DKA and left AMA on 05/18/22. Labs were obtained on 05/18/22 to help determine if patient has Type 1  versus Type 2. Spoke with patient in ED (in hallway bed). Patient confirms that he is taking insulin (pens) and Metformin. Patient states he is getting insulin over the counter but was not sure if Novolin N, R, or 70/30. Patient's girlfriend had insulin pen in her purse and it is Novolin N. Patient reports he takes Novolin N 25 units around 5:30 am, Metformin 500 mg around 8:00 am, Novolin N 25 units around 8:00 pm, and Metformin 500 mg around 10:00 pm. Patient admits that he is not taking the insulin consistently. He stated that he tried to stop taking the insulin all together because it is making him lose weight and he does not want to have to take medications. Patient states that he would rather use vitamins or change his diet than take medications for DM consistently.  Patient confirms symptoms of hyperglycemia and that he stays tired and weak. Inquired if patient had noted any labs from when he was at the hospital on 05/18/22 and patient stated he had not looked at labs from that visit. Discussed Type 1 verus Type 2. Discussed basic pathophysiology of DKA and that the lack of insulin is likely what is causing him to continue to lose weight and feel poorly.  I also talked with him about his labs done on 05/18/22 (c-peptide 0.4; GAD antibodies  1013; insulin antibodies 46). Discussed that based on labs he may likely have Type 1 DM and if so would absolutely require insulin that is taken consistently. Stressed that given his age and poor control, he is at increased risk of developing complications from uncontrolled DM. Patient reports checking glucose several times a day.  Discussed importance of checking CBGs and maintaining good CBG control to prevent long-term and short-term complications. Explained how hyperglycemia leads to damage within blood vessels which lead to the common complications seen with uncontrolled diabetes. Stressed to the patient the importance of improving glycemic control to prevent further  complications from uncontrolled diabetes. Explained to patient that he will need to take insulin consistently and encouraged patient to get established with an Endocrinology. Patient states that he has Medicaid (had insurance with job but no longer has that insurance). Discussed possibility of using CMG to obtain more glucose data and help providers outpatient to make changes with insulin regimen. Patient stated he would think about CMGs and let me know if he is interested. Informed patient that I would plan to follow up with patient tomorrow.  Patient verbalized understanding of information discussed and reports no further questions at this time related to diabetes.  Thanks, Orlando Penner, RN, MSN, CDE Diabetes Coordinator Inpatient Diabetes Program [phone] (Team Pager)

## 2022-07-09 NOTE — ED Triage Notes (Signed)
Pt reports saw family over the holidays and when he came home everyone in his house was sick. Pt reports NV for the last 24 hours. Denies pain, cough, congestion or other sx's. Unable to obtain an oral temp on pt due to cold drink he has. Temp obtained under his arm

## 2022-07-09 NOTE — Discharge Instructions (Signed)
Call [facility] to see about getting appointment to establish care. [address], [place], [place] [address] Phone: [phone]

## 2022-07-09 NOTE — ED Notes (Signed)
Report given to [facility], pt moved to [facility] pending admission.

## 2022-07-10 ENCOUNTER — Other Ambulatory Visit: Payer: Self-pay

## 2022-07-10 ENCOUNTER — Encounter: Payer: Self-pay | Admitting: Internal Medicine

## 2022-07-10 LAB — BASIC METABOLIC PANEL
Anion gap: 10 (ref 5–15)
Anion gap: 9 (ref 5–15)
BUN: 10 mg/dL (ref 6–20)
BUN: 9 mg/dL (ref 6–20)
CO2: 20 mmol/L — ABNORMAL LOW (ref 22–32)
CO2: 23 mmol/L (ref 22–32)
Calcium: 8.5 mg/dL — ABNORMAL LOW (ref 8.9–10.3)
Calcium: 8.8 mg/dL — ABNORMAL LOW (ref 8.9–10.3)
Chloride: 105 mmol/L (ref 98–111)
Chloride: 106 mmol/L (ref 98–111)
Creatinine, Ser: 0.63 mg/dL (ref 0.61–1.24)
Creatinine, Ser: 0.72 mg/dL (ref 0.61–1.24)
GFR, Estimated: >60 mL/min (ref >60)
GFR, Estimated: >60 mL/min (ref >60)
Glucose, Bld: 127 mg/dL — ABNORMAL HIGH (ref 70–99)
Glucose, Bld: 272 mg/dL — ABNORMAL HIGH (ref 70–99)
Potassium: 3.8 mmol/L (ref 3.5–5.1)
Potassium: 4 mmol/L (ref 3.5–5.1)
Sodium: 135 mmol/L (ref 135–145)
Sodium: 138 mmol/L (ref 135–145)

## 2022-07-10 LAB — GLUCOSE, CAPILLARY
Glucose-Capillary: 108 mg/dL — ABNORMAL HIGH (ref 70–99)
Glucose-Capillary: 260 mg/dL — ABNORMAL HIGH (ref 70–99)
Glucose-Capillary: 253 mg/dL — ABNORMAL HIGH (ref 70–99)
Glucose-Capillary: 251 mg/dL — ABNORMAL HIGH (ref 70–99)
Glucose-Capillary: 269 mg/dL — ABNORMAL HIGH (ref 70–99)

## 2022-07-10 MED ORDER — TRUE METRIX METER W/DEVICE KIT
PACK | 0 refills | Status: DC
  Filled 2022-07-10: qty 1, 30d supply, fill #0

## 2022-07-10 MED ORDER — NOVOLIN 70/30 FLEXPEN RELION (70-30) 100 UNIT/ML ~~LOC~~ SUPN
14.0000 [IU] | PEN_INJECTOR | Freq: Two times a day (BID) | SUBCUTANEOUS | 11 refills | Status: DC
  Filled 2022-07-10: qty 15, 54d supply, fill #0

## 2022-07-10 MED ORDER — INSULIN LISPRO PROT & LISPRO (75-25 MIX) 100 UNIT/ML KWIKPEN
PEN_INJECTOR | SUBCUTANEOUS | 11 refills | Status: DC
  Filled 2022-07-10: qty 9, 32d supply, fill #0

## 2022-07-10 MED ORDER — "PEN NEEDLES 3/16"" 31G X 5 MM MISC"
4 refills | Status: DC
  Filled 2022-07-10 (×2): qty 100, 50d supply, fill #0
  Filled 2023-01-07: qty 100, 34d supply, fill #1
  Filled 2023-02-12 (×2): qty 100, 34d supply, fill #2

## 2022-07-10 MED ORDER — INSULIN ASPART PROT & ASPART (70-30 MIX) 100 UNIT/ML ~~LOC~~ SUSP
14.0000 [IU] | Freq: Two times a day (BID) | SUBCUTANEOUS | Status: DC
  Administered 2022-07-10: 14 [IU] via SUBCUTANEOUS
  Filled 2022-07-10: qty 10

## 2022-07-10 MED ORDER — TRUEPLUS LANCETS 28G MISC
4 refills | Status: DC
  Filled 2022-07-10: qty 100, 25d supply, fill #0

## 2022-07-10 MED ORDER — GLUCOSE BLOOD VI STRP
ORAL_STRIP | 4 refills | Status: DC
  Filled 2022-07-10: qty 100, 25d supply, fill #0
  Filled 2023-01-07: qty 100, 25d supply, fill #1

## 2022-07-10 MED ORDER — FREESTYLE LIBRE 3 SENSOR MISC
4 refills | Status: DC
  Filled 2022-07-10: qty 2, fill #0
  Filled 2023-01-07: qty 2, 28d supply, fill #0

## 2022-07-10 NOTE — Discharge Summary (Addendum)
Physician Discharge Summary   Patient: Jerome Murphy MRN: [id number] DOB: 06-06-1991  Admit date:     07/09/2022  Discharge date: 07/10/22  Discharge Physician: Fritzi Mandes   PCP: Elsie Stain, MD   Recommendations at discharge:    [facility]u [facility] in [place] in 1 week Check sugars atleast 2-3 times a day and keep log of it  Discharge Diagnoses: Principal Problem:   DKA (diabetic ketoacidosis) ([place]) Active Problems:   Diabetes mellitus without complication ([place])   HLD (hyperlipidemia)   Asthma   Nausea vomiting and diarrhea   Tobacco dependence   Hospital Course: Jerome Murphy is a 31 y.o. male with medical history significant of hypertension, diabetes mellitus, asthma, tobacco abuse, who presents with nausea, vomiting, diarrhea.   Pt was found to have DKA (blood sugar 336, bicarbonate 14, anion gap 19, beta hydroxybutyric acid 7.70)  DKA (diabetic ketoacidosis) ([place]):  --pt was admitted with IVF, insulin gtt and converted with closure of AG --seen by DM educator --labs favor possible Type 1 dm --advised to take insulin, try f/u with Endocirnology (no insurance), ?Insulin pump in future --A1c 13.9%   Diabetes mellitus without complication [facility]): Recent A1c 13.9, poorly controlled.  Patient is not taking insulin consistently. -as above   HLD (hyperlipidemia): not taking Lipitor currently -f/u with PCP   Asthma: Stable -As needed albuterol   Nausea vomiting and diarrhea: Possibly due to viral gastroenteritis.  His diarrhea has resolved.   --resolved   Tobacco dependence -Nicotine patch    d/c home. Pt agreeable. F[facility] [facility] clinic [place] and see if they are able to help with possible insulin pump   DVT ppx: SQ Lovenox   Code Status: Full code     Disposition: Home Diet recommendation:  Carb modified diet DISCHARGE MEDICATION: Allergies as of 07/10/2022       Reactions   Robitussin [guaifenesin] Hives,  Itching        Medication List     STOP taking these medications    atorvastatin 20 MG tablet Commonly known as: LIPITOR   gabapentin 300 MG capsule Commonly known as: NEURONTIN   HumuLIN N KwikPen 100 UNIT/ML Kiwkpen Generic drug: Insulin NPH (Human) ([place])   insulin regular 100 units/mL injection Commonly known as: NOVOLIN R   metFORMIN 750 MG 24 hr tablet Commonly known as: GLUCOPHAGE-XR   ondansetron 4 MG disintegrating tablet Commonly known as: Zofran ODT   predniSONE 20 MG tablet Commonly known as: DELTASONE       TAKE these medications    albuterol 108 (90 Base) MCG/ACT inhaler Commonly known as: VENTOLIN HFA Inhale 2 puffs into the lungs every 6 (six) hours as needed for wheezing or shortness of breath.   FreeStyle Libre 3 Sensor Misc Place 1 sensor on the skin every 14 days. Use to check glucose continuously   glucose blood test strip Use to test blood sugar before meals and at bedtime.   Insulin Lispro Prot & Lispro (75-25) 100 UNIT/ML Kwikpen Commonly known as: HUMALOG 75/25 MIX Inject 14 units into the skin twice daily.   Pen Needles 3/16" 31G X 5 MM Misc Use 2 times a day   True Metrix Meter w/Device Kit Use to test blood sugar before meals and at bedtime.   TRUEplus Lancets 28G Misc Use to test blood sugar before meals and at bedtime.        Follow-up Information     Elsie Stain, MD. Go on 08/05/2022.  Specialty: Pulmonary Disease Why: Appointment @ 9:30 am with Dr. Margarita Rana on 08/05/22. Contact information: [address] [place] [address] [phone]                Filed Weights   07/09/22 0816  Weight: 79 kg     Condition at discharge: fair  The results of significant diagnostics from this hospitalization (including imaging, microbiology, ancillary and laboratory) are listed below for reference.   Imaging Studies: No results found.  Microbiology: Results for orders placed or  performed during the hospital encounter of 07/09/22  Resp panel by RT-PCR (RSV, Flu A&B, Covid) Anterior Nasal Swab     Status: None   Collection Time: 07/09/22  8:21 AM   Specimen: Anterior Nasal Swab  Result Value Ref Range Status   SARS Coronavirus 2 by RT PCR NEGATIVE NEGATIVE Final    Comment: (NOTE) SARS-CoV-2 target nucleic acids are NOT DETECTED.  The SARS-CoV-2 RNA is generally detectable in upper respiratory specimens during the acute phase of infection. The lowest concentration of SARS-CoV-2 viral copies this assay can detect is 138 copies/mL. A negative result does not preclude SARS-Cov-2 infection and should not be used as the sole basis for treatment or other patient management decisions. A negative result may occur with  improper specimen collection/handling, submission of specimen other than nasopharyngeal swab, presence of viral mutation(s) within the areas targeted by this assay, and inadequate number of viral copies(<138 copies/mL). A negative result must be combined with clinical observations, patient history, and epidemiological information. The expected result is Negative.  Fact Sheet for Patients:  EntrepreneurPulse.com.au  Fact Sheet for Healthcare Providers:  IncredibleEmployment.be  This test is no t yet approved or cleared by the [place] FDA and  has been authorized for detection and/or diagnosis of SARS-CoV-2 by FDA under an Emergency Use Authorization (EUA). This EUA will remain  in effect (meaning this test can be used) for the duration of the COVID-19 declaration under Section 564(b)(1) of the Act, 21 U.S.C.section 360bbb-3(b)(1), unless the authorization is terminated  or revoked sooner.       Influenza A by PCR NEGATIVE NEGATIVE Final   Influenza B by PCR NEGATIVE NEGATIVE Final    Comment: (NOTE) The Xpert Xpress SARS-CoV-2/FLU/RSV plus assay is intended as an aid in the diagnosis of influenza from  Nasopharyngeal swab specimens and should not be used as a sole basis for treatment. Nasal washings and aspirates are unacceptable for Xpert Xpress SARS-CoV-2/FLU/RSV testing.  Fact Sheet for Patients: EntrepreneurPulse.com.au  Fact Sheet for Healthcare Providers: IncredibleEmployment.be  This test is not yet approved or cleared by the [place] FDA and has been authorized for detection and/or diagnosis of SARS-CoV-2 by FDA under an Emergency Use Authorization (EUA). This EUA will remain in effect (meaning this test can be used) for the duration of the COVID-19 declaration under Section 564(b)(1) of the Act, 21 U.S.C. section 360bbb-3(b)(1), unless the authorization is terminated or revoked.     Resp Syncytial Virus by PCR NEGATIVE NEGATIVE Final    Comment: (NOTE) Fact Sheet for Patients: EntrepreneurPulse.com.au  Fact Sheet for Healthcare Providers: IncredibleEmployment.be  This test is not yet approved or cleared by the [place] FDA and has been authorized for detection and/or diagnosis of SARS-CoV-2 by FDA under an Emergency Use Authorization (EUA). This EUA will remain in effect (meaning this test can be used) for the duration of the COVID-19 declaration under Section 564(b)(1) of the Act, 21 U.S.C. section 360bbb-3(b)(1), unless the  authorization is terminated or revoked.  Performed at [facility], [address] [place], [place] [address]     Labs: CBC: Recent Labs  Lab 07/09/22 0901  WBC 5.0  HGB 16.9  HCT 48.8  MCV 93.3  PLT 540   Basic Metabolic Panel: Recent Labs  Lab 07/09/22 0901 07/09/22 1605 07/09/22 2358 07/10/22 0456  NA 133* 135 138 135  K 4.3 3.7 3.8 4.0  CL 100 109 106 105  CO2 14* 20* 23 20*  GLUCOSE 336* 162* 127* 272*  BUN _0 CREATININE 0.95 0.61 0.72 0.63  CALCIUM 9.0 8.2* 8.8* 8.5*   Liver Function Tests: Recent Labs  Lab  07/09/22 0901  AST 16  ALT 12  ALKPHOS 74  BILITOT 1.6*  PROT 7.5  ALBUMIN 4.2   CBG: Recent Labs  Lab 07/09/22 2324 07/10/22 0249 07/10/22 0619 07/10/22 0756 07/10/22 1131  GLUCAP 108* 260* 253* 251* 269*    Discharge time spent: greater than 30 minutes.  Signed: Fritzi Mandes, MD [facility] 07/10/2022

## 2022-07-10 NOTE — Inpatient Diabetes Management (Addendum)
Inpatient Diabetes Program Recommendations  AACE/ADA: New Consensus Statement on Inpatient Glycemic Control   Target Ranges:  Prepandial:   less than 140 mg/dL      Peak postprandial:   less than 180 mg/dL (1-2 hours)      Critically ill patients:  140 - 180 mg/dL    Latest Reference Range & Units 07/10/22 02:49 07/10/22 06:19  Glucose-Capillary 70 - 99 mg/dL 260 (H) 253 (H)    Latest Reference Range & Units 07/09/22 15:01 07/09/22 16:00 07/09/22 17:07 07/09/22 18:24 07/09/22 19:01 07/09/22 20:37 07/09/22 22:01 07/09/22 23:24  Glucose-Capillary 70 - 99 mg/dL 181 (H) 162 (H) 141 (H) 121 (H) 116 (H) 119 (H) 128 (H) 108 (H)    Latest Reference Range & Units 05/18/22 20:00  Insulin Antibodies, Human uU/mL 46 (H)  Glutamic Acid Decarb Ab 0.0 - 5.0 U/mL 1,013.1 (H)  C-Peptide 1.1 - 4.4 ng/mL 0.4 (L)   Review of Glycemic Control  Diabetes history: DM (? Type 1) Outpatient Diabetes medications: Novolin N 25 units BID, Metformin 500 mg BID  Current orders for Inpatient glycemic control: Semglee 10 units daily, Novolog 0-9 units TID with meals, Novolog 0-5 units QHS, Novolog 3 units TID with meals  Inpatient Diabetes Program Recommendations:    Insulin: To simplify insulin regimen and improve compliance, consider switching to 70/30 insulin. If agreeable, discontinue Semglee 10 units daily and Novolog 3 units TID meal coverage and order 70/30 14 units BID (dose would provide a total of 20 units for basal and 8 units for meal coverage per day).  Outpatient: At time of discharge please provide Rx for Humalog 75/25 Kwikpens [phone]), insulin pen needles [id number]), and FreeStyle Libre 3 sensors [phone]).  NOTE: Patient was transitioned from IV to SQ insulin on 07/09/22; received Semglee 10 units at 20:32 and CBG 253 mg/dl this morning. Patient has been taking Novolin N (over the counter from [place]) and Metformin outpatient. Patient reported that he did not want to have to take medication  consistently for DM but explained that he requires insulin and he will need to take insulin consistently. Would recommend to discharge on 70/30 insulin to simplify regimen and hopefully improve compliance. Will plan to talk with patient again today.  Addendum 07/10/22@11 :45-Spoke with patient regarding DM1 versus DM2 and reviewed information discussed yesterday. Patient reports he understands now that he will require insulin. Discussed mixed insulins (Humalog 75/25, Novolin 70/30) and how it is typically taken BID with breakfast and supper. Patient is agreeable to take mixed insulin BID and would prefer to use insulin pens.  Reviewed glucose and A1C goals. Discussed hyperglycemia, hypoglycemia, symptoms and treatment for both. Patient reports that he typically feels symptoms of hypoglycemia if he dropped his glucose too fast at home. Encouraged patient to get glucose tablets to keep on hand to treat hypoglycemia. Given permission to give patient FreeStyle Libre3 CGM sensor samples by Dr. Posey Pronto. Educated patient on FreeStyle Libre3 CGM regarding application and changing CGM sensor (alternate every 14 days on back of arms), 1 hour warm-up, how to scan sensor to start a new sensor, and how to use app to check glucose.  Patient given Freestyle Libre3 sensor samples. Patient was able to apply FreeStyle Libre3 to back of upper left arm at 11:40.  Provided educational packet regarding FreeStyle Libre3 CGM.  Patient plans to use iphone FreeStyle Libre3 app to read FreeStyle Libre sensor and was able to download the app and create an account. Informed patient that it would be requested that  attending provider provide Rx for first month of FreeStyle Libre3 sensors and that she have PCP continue to provide Rx for FreeStyle Libre3 sensors going forward. Asked patient to be sure to let PCP know about Radene Journey and allow provider to review reports from [place] app so the provider can use the information to continue to make  adjustments with DM medications if needed. Patient very appreciative of information discussed and states he feels much more educated on DM and how to manage DM. Patient reports that he is planning to start a new job after the first of the year and would have insurance through employer. Patient had reported yesterday that he had Medicaid but currently in chart shows Medicaid Potential.  Patient verbalized understanding of information and has no other questions at this time. Per Dareen Piano, RPh patient will be getting medications filled through medication assistance and they do not have Novolog or Novolin 70/30 but they do have Humalog 75/25 insulin pens. Therefore, it would be requested that patient be discharged on Humalog 75/25 Kwikpens [phone]).  Thanks, Orlando Penner, RN, MSN, CDCES Diabetes Coordinator Inpatient Diabetes Program [phone] (Team Pager from 8am to 5pm)

## 2022-07-10 NOTE — Plan of Care (Signed)
  Problem: Education: Goal: Ability to describe self-care measures that may prevent or decrease complications (Diabetes Survival Skills Education) will improve Outcome: Progressing Goal: Individualized Educational Video(s) Outcome: Progressing   Problem: Metabolic: Goal: Ability to maintain appropriate glucose levels will improve Outcome: Progressing   Problem: Nutritional: Goal: Maintenance of adequate nutrition will improve Outcome: Progressing Goal: Progress toward achieving an optimal weight will improve Outcome: Progressing   Problem: Skin Integrity: Goal: Risk for impaired skin integrity will decrease Outcome: Progressing

## 2022-07-10 NOTE — TOC CM/SW Note (Signed)
Patient has orders to discharge home today. Chart reviewed. No TOC needs identified. CSW signing off.  Theodor Mustin, CSW [phone]  

## 2022-07-14 ENCOUNTER — Telehealth: Payer: Self-pay

## 2022-07-14 NOTE — Telephone Encounter (Signed)
Transition Care Management Unsuccessful Follow-up Telephone Call  Date of discharge and from where:  07/10/2022 [facility]  Attempts:  1st Attempt  Reason for unsuccessful TCM follow-up call:  Voice mail full [phone].

## 2022-07-15 ENCOUNTER — Telehealth: Payer: Self-pay

## 2022-07-15 NOTE — Telephone Encounter (Signed)
Transition Care Management Unsuccessful Follow-up Telephone Call  Date of discharge and from where:  07/10/2022, [facility]  Attempts:  2nd Attempt  Reason for unsuccessful TCM follow-up call:  Voice mail full [phone].

## 2022-07-16 ENCOUNTER — Telehealth: Payer: Self-pay

## 2022-07-16 NOTE — Telephone Encounter (Signed)
Transition Care Management Unsuccessful Follow-up Telephone Call  Date of discharge and from where:  07/10/2022, [facility]  Attempts:  3rd Attempt  Reason for unsuccessful TCM follow-up call:  Voice mail full [phone].         Patient has an appointment at [facility] with Dr Margarita Rana - 08/05/2022.

## 2022-08-05 ENCOUNTER — Other Ambulatory Visit: Payer: Self-pay

## 2022-08-05 ENCOUNTER — Encounter: Payer: Self-pay | Admitting: Family Medicine

## 2022-08-05 ENCOUNTER — Ambulatory Visit: Payer: Medicaid Other | Attending: Family Medicine | Admitting: Family Medicine

## 2022-08-05 VITALS — BP 95/65 | HR 88 | Temp 98.1°F | Ht 71.0 in | Wt 158.0 lb

## 2022-08-05 DIAGNOSIS — F17210 Nicotine dependence, cigarettes, uncomplicated: Secondary | ICD-10-CM

## 2022-08-05 DIAGNOSIS — E1069 Type 1 diabetes mellitus with other specified complication: Secondary | ICD-10-CM | POA: Diagnosis not present

## 2022-08-05 LAB — GLUCOSE, POCT (MANUAL RESULT ENTRY): POC Glucose: 197 mg/dl — AB (ref 70–99)

## 2022-08-05 MED ORDER — INSULIN LISPRO PROT & LISPRO (75-25 MIX) 100 UNIT/ML KWIKPEN
20.0000 [IU] | PEN_INJECTOR | Freq: Two times a day (BID) | SUBCUTANEOUS | 3 refills | Status: DC
  Filled 2022-08-05 – 2022-08-14 (×2): qty 30, 75d supply, fill #0
  Filled 2022-11-09: qty 30, 75d supply, fill #1
  Filled 2023-01-07: qty 30, 75d supply, fill #2
  Filled 2023-02-18: qty 30, 75d supply, fill #3

## 2022-08-05 NOTE — Progress Notes (Signed)
Subjective:  Patient ID: Jerome Murphy, male    DOB: [birth year]  Age: 32 y.o. MRN: [id number]  CC: Hospitalization Follow-up   HPI Jerome Murphy is a 32 y.o. year old male with a history of hypertension, nicotine dependence, asthma, type 1 diabetes mellitus diagnosed in his mid 78s (A1c 13.9)  Interval History: He was hospitalized in 06/2022 for DKA which was treated with IV fluids and insulin.  Per notes he had not been consistent with his insulin. His labs from 05/18/2022 in keeping with type 1 diabetes mellitus with elevated gad 65 antibodies, elevated insulin antibody, decreased C-peptide.  He is concerned his Metformin was discontinued and he was administering 20 units bid of 70/30 insulin pen purchased OTC from [facility] but was changed Humalog 75/25, 14 units bid after his hospitalization.  He states at his home sugars have been running between 200-300 and he is afraid to eat so was not to have elevated blood sugars. He is also concerned he has been unable to gain weight. Past Medical History:  Diagnosis Date   Asthma    Diabetes mellitus without complication (HCC)    History of gallstones    Narcolepsy    Seizures (HCC)     Past Surgical History:  Procedure Laterality Date   CIRCUMCISION     HERNIA REPAIR     TONSILLECTOMY      Family History  Problem Relation Age of Onset   Diabetes Mother    Diabetes Maternal Uncle     Social History   Socioeconomic History   Marital status: Single    Spouse name: Not on file   Number of children: 2   Years of education: Not on file   Highest education level: Associate degree: occupational, Scientist, product/process development, or vocational program  Occupational History   Occupation: Barbershop  Tobacco Use   Smoking status: Every Day    Packs/day: 0.25    Types: Cigarettes   Smokeless tobacco: Never  Vaping Use   Vaping Use: Never used  Substance and Sexual Activity   Alcohol use: No    Comment: occasionallly   Drug  use: No   Sexual activity: Yes    Birth control/protection: Condom    Comment: Partner uses Depo  Other Topics Concern   Not on file  Social History Narrative   Not on file   Social Determinants of Health   Financial Resource Strain: Not on file  Food Insecurity: No Food Insecurity (07/10/2022)   Hunger Vital Sign    Worried About Running Out of Food in the Last Year: Never true    Ran Out of Food in the Last Year: Never true  Transportation Needs: No Transportation Needs (07/10/2022)   PRAPARE - Administrator, Civil Service (Medical): No    Lack of Transportation (Non-Medical): No  Physical Activity: Not on file  Stress: Not on file  Social Connections: Not on file    Allergies  Allergen Reactions   Robitussin [Guaifenesin] Hives and Itching    Outpatient Medications Prior to Visit  Medication Sig Dispense Refill   albuterol (VENTOLIN HFA) 108 (90 Base) MCG/ACT inhaler Inhale 2 puffs into the lungs every 6 (six) hours as needed for wheezing or shortness of breath. 8 g 2   Blood Glucose Monitoring Suppl (TRUE METRIX METER) w/Device KIT Use to test blood sugar before meals and at bedtime. 1 kit 0   Continuous Blood Gluc Sensor (FREESTYLE LIBRE 3 SENSOR) MISC Place 1 sensor on the  skin every 14 days. Use to check glucose continuously 2 each 4   glucose blood test strip Use to test blood sugar before meals and at bedtime. 100 each 4   Insulin Pen Needle (PEN NEEDLES 3/16") 31G X 5 MM MISC Use 2 times a day 100 each 4   TRUEplus Lancets 28G MISC Use to test blood sugar before meals and at bedtime. 100 each 4   Insulin Lispro Prot & Lispro (HUMALOG 75/25 MIX) (75-25) 100 UNIT/ML Kwikpen Inject 14 units into the skin twice daily. 15 mL 11   No facility-administered medications prior to visit.     ROS Review of Systems  Constitutional:  Negative for activity change and appetite change.  HENT:  Negative for sinus pressure and sore throat.   Respiratory:  Negative  for chest tightness, shortness of breath and wheezing.   Cardiovascular:  Negative for chest pain and palpitations.  Gastrointestinal:  Negative for abdominal distention, abdominal pain and constipation.  Genitourinary: Negative.   Musculoskeletal: Negative.   Psychiatric/Behavioral:  Negative for behavioral problems and dysphoric mood.     Objective:  BP 95/65   Pulse 88   Temp 98.1 F (36.7 C) (Oral)   Ht 5\' 11"  (1.803 m)   Wt 158 lb (71.7 kg)   SpO2 98%   BMI 22.04 kg/m      08/05/2022    9:59 AM 07/10/2022    7:55 AM 07/10/2022    3:19 AM  BP/Weight  Systolic BP 95 010 932  Diastolic BP 65 65 91  Wt. (Lbs) 158    BMI 22.04 kg/m2        Physical Exam Constitutional:      Appearance: He is well-developed.  Cardiovascular:     Rate and Rhythm: Normal rate.     Heart sounds: Normal heart sounds. No murmur heard. Pulmonary:     Effort: Pulmonary effort is normal.     Breath sounds: Normal breath sounds. No wheezing or rales.  Chest:     Chest wall: No tenderness.  Abdominal:     General: Bowel sounds are normal. There is no distension.     Palpations: Abdomen is soft. There is no mass.     Tenderness: There is no abdominal tenderness.  Musculoskeletal:        General: Normal range of motion.     Right lower leg: No edema.     Left lower leg: No edema.  Neurological:     Mental Status: He is alert and oriented to person, place, and time.  Psychiatric:        Mood and Affect: Mood normal.        Latest Ref Rng & Units 07/10/2022    4:56 AM 07/09/2022   11:58 PM 07/09/2022    4:05 PM  CMP  Glucose 70 - 99 mg/dL 272  127  162   BUN 6 - 20 mg/dL 10  9  9    Creatinine 0.61 - 1.24 mg/dL 0.63  0.72  0.61   Sodium 135 - 145 mmol/L 135  138  135   Potassium 3.5 - 5.1 mmol/L 4.0  3.8  3.7   Chloride 98 - 111 mmol/L 105  106  109   CO2 22 - 32 mmol/L 20  23  20    Calcium 8.9 - 10.3 mg/dL 8.5  8.8  8.2     Lipid Panel     Component Value Date/Time   CHOL  132 08/28/2020 1036   TRIG  63 08/28/2020 1036   HDL 37 (L) 08/28/2020 1036   CHOLHDL 3.6 08/28/2020 1036   CHOLHDL 4.3 08/29/2019 1036   LDLCALC 82 08/28/2020 1036   LDLCALC 107 (H) 08/29/2019 1036    CBC    Component Value Date/Time   WBC 5.0 07/09/2022 0901   RBC 5.23 07/09/2022 0901   HGB 16.9 07/09/2022 0901   HCT 48.8 07/09/2022 0901   PLT 168 07/09/2022 0901   MCV 93.3 07/09/2022 0901   MCH 32.3 07/09/2022 0901   MCHC 34.6 07/09/2022 0901   RDW 11.8 07/09/2022 0901   LYMPHSABS 2.7 05/18/2022 1224   MONOABS 0.4 05/18/2022 1224   EOSABS 0.6 (H) 05/18/2022 1224   BASOSABS 0.1 05/18/2022 1224    Lab Results  Component Value Date   HGBA1C 13.9 (H) 05/18/2022    Assessment & Plan:  1. Type 1 diabetes mellitus with other specified complication (HCC) Counseled that he has Type 1 DM not type 2 based on labs - positive GAD 65, islet cell ab Educated that insulin and not oral medications is recommended for management of Type ! DM  He will benefit from an insulin pump He does have a CGM but is not currently wearing his sensor. Discussed significance of time in range, management of hypoglycemia and he has been advised to uptitrate insulin by 2 units bid if sugars are still elevated and down titrate by 2 units bid with hypoglycemia Referred to Endocrine - POCT glucose (manual entry) - Ambulatory referral to Endocrinology - Insulin Lispro Prot & Lispro (HUMALOG 75/25 MIX) (75-25) 100 UNIT/ML Kwikpen; Inject 20 Units into the skin 2 (two) times daily.  Dispense: 30 mL; Refill: 3    Meds ordered this encounter  Medications   Insulin Lispro Prot & Lispro (HUMALOG 75/25 MIX) (75-25) 100 UNIT/ML Kwikpen    Sig: Inject 20 Units into the skin 2 (two) times daily.    Dispense:  30 mL    Refill:  3    Follow-up: Return in about 3 months (around 11/04/2022) for Medical conditions with PCP.       Charlott Rakes, MD, FAAFP. [facility] [place], [place]   08/05/2022, 12:36 PM

## 2022-08-05 NOTE — Progress Notes (Signed)
Discuss medication.

## 2022-08-05 NOTE — Patient Instructions (Signed)
Type 1 Diabetes Mellitus, Diagnosis, Adult  Type 1 diabetes (type 1 diabetes mellitus) is a long-term, or chronic, disease. It occurs when the cells in the pancreas (beta cells) that make a hormone called insulin are destroyed. Normally, insulin allows blood sugar, also called glucose, to enter cells in the body. The cells use glucose for energy. Lack of insulin causes excess glucose to build up in the blood instead of going into cells. As a result, high blood glucose, or hyperglycemia, develops. There is currently no cure for type 1 diabetes, but it can be managed with insulin therapy and lifestyle changes. What are the causes? The exact cause of type 1 diabetes is not known. What increases the risk? You may be more likely to develop this condition if you have a family member who has type 1 diabetes. Other factors may also make you more likely to develop type 1 diabetes, such as: Having a gene for type 1 diabetes that is passed from parent to child (inherited). Having autoimmune disorders. These are conditions in which the body's disease-fighting system (immunesystem) attacks itself. Being exposed to certain viruses. Living in an area with cold weather conditions. What are the signs or symptoms? Symptoms may develop slowly over days or weeks, or they may develop suddenly. Symptoms may include: Increased thirst or hunger. Increased urination or increased urination during the night. Sudden or unexplained weight changes. Tiredness (fatigue) or weakness. Vision changes, such as blurry vision. How is this diagnosed? This condition is diagnosed based on your symptoms, your medical history, a physical exam, and your blood glucose level. Your blood glucose may be checked with one or more of the following blood tests: A fasting blood glucose (FBG) test. You will not be allowed to eat (you will fast) for 8 hours or longer before a blood sample is taken. A random blood glucose test. This checks blood  glucose at any time of day no matter when you ate. An A1C (hemoglobin A1C) blood test. This gives information about blood glucose control over the last 2-3 months. You may be diagnosed with type 1 diabetes if: Your FBG level is 126 mg/dL (7 mmol/L) or higher. Your random blood glucose level is 200 mg/dL (11.1 mmol/L) or higher. Your hemoglobin A1C level is 6.5% or higher. These blood tests may be repeated to confirm your diagnosis. How is this treated? This condition may be managed by a specialist called an endocrinologist. You can help manage your type 1 diabetes by following instructions from your health care provider about: Taking insulin daily. This helps to keep your blood glucose levels in the healthy range. Taking medicines to help prevent complications from diabetes. These may include: Aspirin. Medicine to lower cholesterol. Medicine to control blood pressure. Checking your blood glucose as often as told. Making diet and lifestyle changes. These may include: Following an individualized nutrition plan that is developed by a registered dietitian. Getting regular exercise. Finding ways to manage stress. Your health care provider will set individualized treatment goals for you. Your goals will be based on your age, other medical conditions you have, and how you respond to diabetes treatment. In general, the goal of treatment is to maintain the following blood glucose levels: Before meals (preprandial): 80-130 mg/dL (4.4-7.2 mmol/L). After meals (postprandial): below 180 mg/dL (10 mmol/L). Hemoglobin A1C level: less than 7%. Follow these instructions at home: Questions to ask your health care provider Consider asking the following questions: Do I need to meet with a certified diabetes care and education   specialist? Should I consider joining a support group for people with diabetes? What equipment will I need to manage my diabetes at home? What diabetes medicines should I take, and  when? How often should I check my blood glucose? What number should I call if I have questions? When is my next appointment? General instructions Take over-the-counter and prescription medicines only as told by your health care provider. Keep all follow-up visits as told by your health care provider. This is important. Where to find more information American Diabetes Association (ADA): diabetes.org Association of Diabetes Care and Education Specialists (ADCES): diabeteseducator.org International Diabetes Federation (IDF): idf.org Contact a health care provider if: Your blood glucose level is higher than 240 mg/dL (13.3 mmol/L) for 2 days in a row. You have been sick or have had a fever for 2 days or more and you are not getting better. You have any of the following problems for more than 6 hours: You cannot eat or drink. You have nausea and vomiting. You have diarrhea. Get help right away if: Your blood glucose is below 54 mg/dL (3 mmol/L). You become confused or you have trouble thinking clearly. You have trouble breathing. These symptoms may represent a serious problem that is an emergency. Do not wait to see if the symptoms will go away. Get medical help right away. Call your local emergency services (911 in the [place] Do not drive yourself to the hospital. Summary Type 1 diabetes (type 1 diabetes mellitus) is a long-term, or chronic, disease. It occurs when the cells in the pancreas (beta cells) that make a hormone called insulin are destroyed. There is currently no cure for type 1 diabetes. The exact cause of type 1 diabetes is not known. This condition is treated by taking insulin and other medicines to help prevent complications from diabetes. Diet and lifestyle changes are also part of treatment. Your health care provider will set individualized treatment goals for you. Your goals will be based on your age, other medical conditions you have, and how you respond to diabetes  treatment. This information is not intended to replace advice given to you by your health care provider. Make sure you discuss any questions you have with your health care provider. Document Revised: 08/17/2019 Document Reviewed: 08/17/2019 Elsevier Patient Education  2022 Elsevier Inc.  

## 2022-08-12 ENCOUNTER — Other Ambulatory Visit: Payer: Self-pay

## 2022-08-14 ENCOUNTER — Other Ambulatory Visit: Payer: Self-pay

## 2022-09-01 ENCOUNTER — Ambulatory Visit: Payer: Self-pay

## 2022-09-01 NOTE — Telephone Encounter (Signed)
Call from sister -Pt has refused to go to ED. Pt is weak, sleeping a lot,  with sunken eyes, and has vomited 4 times today. Sister called back requesting  that EMS be dispatched. Current Blood sugar is 300. PT recently took insulin. Called EMS  - dispatching to [address] [place] [address].

## 2022-09-01 NOTE — Telephone Encounter (Signed)
  Chief Complaint: vomiting Symptoms: weak, pale lethargic Frequency: this am  Pertinent Negatives: Patient denies - Disposition: [x]$ ED /[]$ Urgent Care (no appt availability in office) / []$ Appointment(In office/virtual)/ []$  [facility]/ []$ Home Care/ []$ Refused Recommended Disposition /[]$ [facility]/ []$  Follow-up with PCP Additional Notes: unable to get finger stick due to poor perfusion. Advised 911 and pt refused will be taken to hospital by sister. Reason for Disposition  High-risk adult (e.g., diabetes mellitus, brain tumor, V-P shunt, hernia)    diabetes  Answer Assessment - Initial Assessment Questions 1. VOMITING SEVERITY: "How many times have you vomited in the past 24 hours?"     - MILD:  1 - 2 times/day    - MODERATE: 3 - 5 times/day, decreased oral intake without significant weight loss or symptoms of dehydration    - SEVERE: 6 or more times/day, vomits everything or nearly everything, with significant weight loss, symptoms of dehydration      moderate 2. ONSET: "When did the vomiting begin?"      yest 3. FLUIDS: "What fluids or food have you vomited up today?" "Have you been able to keep any fluids down?"     Has not drank anything and is vomiting gastric juices (yellow) 5. DIARRHEA: "Is there any diarrhea?" If Yes, ask: "How many times today?"      no 6. CONTACTS: "Is there anyone else in the family with the same symptoms?"      no 7. CAUSE: "What do you think is causing your vomiting?"     unsure 8. HYDRATION STATUS: "Any signs of dehydration?" (e.g., dry mouth [not only dry lips], too weak to stand) "When did you last urinate?"     Yes-0500 9. OTHER SYMPTOMS: "Do you have any other symptoms?" (e.g., fever, headache, vertigo, vomiting blood or coffee grounds, recent head injury)     Pale, lethargic, weak 10. PREGNANCY: "Is there any chance you are pregnant?" "When was your last menstrual period?"       N/a  Protocols used: Vomiting-A-AH

## 2022-09-03 NOTE — Telephone Encounter (Signed)
FYI

## 2022-09-03 NOTE — Telephone Encounter (Signed)
Noted. Am listwd pcp and have never seen this pt. Happy to see him post hosp

## 2022-09-04 NOTE — Telephone Encounter (Signed)
Noted  

## 2022-09-07 NOTE — Telephone Encounter (Signed)
Called patient and left vm about getting appointment

## 2022-11-09 ENCOUNTER — Other Ambulatory Visit (HOSPITAL_COMMUNITY): Payer: Self-pay

## 2022-11-25 ENCOUNTER — Ambulatory Visit: Payer: Medicaid Other | Admitting: Critical Care Medicine

## 2022-11-25 NOTE — Progress Notes (Deleted)
   Established Patient Office Visit  Subjective   Patient ID: Jerome Murphy, male    DOB: 1991/03/19  Age: 32 y.o. MRN: [id number]  No chief complaint on file.   Not seen since 2022 by Ricke Hey DR Alvis Lemmings 07/2022 Makih Severson is a 32 y.o. year old male with a history of hypertension, nicotine dependence, asthma, type 1 diabetes mellitus diagnosed in his mid 11s (A1c 13.9)   Interval History: He was hospitalized in 06/2022 for DKA which was treated with IV fluids and insulin.  Per notes he had not been consistent with his insulin. His labs from 05/18/2022 in keeping with type 1 diabetes mellitus with elevated gad 65 antibodies, elevated insulin antibody, decreased C-peptide.   He is concerned his Metformin was discontinued and he was administering 20 units bid of 70/30 insulin pen purchased OTC from [facility] but was changed Humalog 75/25, 14 units bid after his hospitalization.  He states at his home sugars have been running between 200-300 and he is afraid to eat so was not to have elevated blood sugars. He is also concerned he has been unable to gain weight. 1. Type 1 diabetes mellitus with other specified complication (HCC) Counseled that he has Type 1 DM not type 2 based on labs - positive GAD 65, islet cell ab Educated that insulin and not oral medications is recommended for management of Type ! DM  He will benefit from an insulin pump He does have a CGM but is not currently wearing his sensor. Discussed significance of time in range, management of hypoglycemia and he has been advised to uptitrate insulin by 2 units bid if sugars are still elevated and down titrate by 2 units bid with hypoglycemia Referred to Endocrine - POCT glucose (manual entry) - Ambulatory referral to Endocrinology - Insulin Lispro Prot & Lispro (HUMALOG 75/25 MIX) (75-25) 100 UNIT/ML Kwikpen; Inject 20 Units into the skin 2 (two) times daily.  Dispense: 30 mL; Refill: 3       {History  (Optional):23778}  ROS    Objective:     There were no vitals taken for this visit. {Vitals History (Optional):23777}  Physical Exam   No results found for any visits on 11/25/22.  {Labs (Optional):23779}  The ASCVD Risk score (Arnett DK, et al., 2019) failed to calculate for the following reasons:   The 2019 ASCVD risk score is only valid for ages 45 to 90    Assessment & Plan:   Problem List Items Addressed This Visit   None   No follow-ups on file.    Shan Levans, MD

## 2023-01-07 ENCOUNTER — Other Ambulatory Visit: Payer: Self-pay | Admitting: Critical Care Medicine

## 2023-01-07 ENCOUNTER — Other Ambulatory Visit: Payer: Self-pay

## 2023-01-07 DIAGNOSIS — E559 Vitamin D deficiency, unspecified: Secondary | ICD-10-CM

## 2023-01-08 ENCOUNTER — Other Ambulatory Visit: Payer: Self-pay | Admitting: Critical Care Medicine

## 2023-01-08 ENCOUNTER — Other Ambulatory Visit: Payer: Self-pay

## 2023-01-08 NOTE — Addendum Note (Signed)
Addended by: Nena Polio on: 01/08/2023 01:29 PM   Modules accepted: Orders

## 2023-01-08 NOTE — Telephone Encounter (Signed)
Medication Refill - Medication: Continuous Glucose Sensor (FREESTYLE LIBRE 3 SENSOR) MISC [[id number]]     Has the patient contacted their pharmacy? Yes.      Preferred Pharmacy (with phone number or street name):  [facility]  [address], [address] [place] [address]  Phone: [phone] Fax: [phone]  Hours: M-F 7:30a-6:00p       Has the patient been seen for an appointment in the last year OR does the patient have an upcoming appointment? Yes.    Agent: Please be advised that RX refills may take up to 3 business days. We ask that you follow-up with your pharmacy.

## 2023-01-08 NOTE — Telephone Encounter (Deleted)
Medication Refill - Medication: Continuous Glucose Sensor (FREESTYLE LIBRE 3 SENSOR) MISC [[id number]]   Has the patient contacted their pharmacy? Yes.   (Agent: If no, request that the patient contact the pharmacy for the refill. If patient does not wish to contact the pharmacy document the reason why and proceed with request.) (Agent: If yes, when and what did the pharmacy advise?) Preferred Pharmacy (with phone number or street name):      Has the patient been seen for an appointment in the last year OR does the patient have an upcoming appointment? Yes.    Agent: Please be advised that RX refills may take up to 3 business days. We ask that you follow-up with your pharmacy.

## 2023-01-11 ENCOUNTER — Other Ambulatory Visit: Payer: Self-pay

## 2023-01-11 NOTE — Telephone Encounter (Signed)
Requested medication (s) are due for refill today: yes  Requested medication (s) are on the active medication list: yes  Last refill:  07/10/22 2 each with 4 RF  Future visit scheduled: yes  Notes to clinic:  ordered by another prescriber at hospital discharge   Requested Prescriptions  Pending Prescriptions Disp Refills   Continuous Glucose Sensor (FREESTYLE LIBRE 3 SENSOR) MISC 2 each 4    Sig: Place 1 sensor on the skin every 14 days. Use to check glucose continuously     Endocrinology: Diabetes - Testing Supplies Passed - 01/08/2023  1:29 PM      Passed - Valid encounter within last 12 months    Recent Outpatient Visits           5 months ago Type 1 diabetes mellitus with other specified complication [facility])   [facility] Hoy Register, MD   10 months ago No-show for appointment   [facility] Storm Frisk, MD   1 year ago Type 2 diabetes mellitus with hyperglycemia, with long-term current use of insulin [facility])   [place] [facility] Storm Frisk, MD   2 years ago Type 2 diabetes mellitus with hyperglycemia, with long-term current use of insulin [facility])   [place] [facility] Alba Cory, MD   3 years ago Type 2 diabetes mellitus with ketoacidosis without coma, without long-term current use of insulin [facility])   [place] [facility] Jamelle Haring, MD       Future Appointments             In 2 weeks Sharon Seller, [place] [facility]

## 2023-01-12 ENCOUNTER — Other Ambulatory Visit: Payer: Self-pay

## 2023-01-12 MED ORDER — FREESTYLE LIBRE 3 SENSOR MISC
4 refills | Status: DC
  Filled 2023-01-12: qty 2, fill #0
  Filled 2023-02-12 (×2): qty 2, 28d supply, fill #0
  Filled 2023-03-18 – 2023-05-03 (×2): qty 2, 28d supply, fill #1

## 2023-01-12 NOTE — Addendum Note (Signed)
Addended by: Lois Huxley, Jeannett Senior L on: 01/12/2023 08:11 AM   Modules accepted: Orders

## 2023-01-27 ENCOUNTER — Ambulatory Visit: Payer: Medicaid Other | Admitting: Physician Assistant

## 2023-02-10 ENCOUNTER — Emergency Department: Payer: Medicaid Other

## 2023-02-10 ENCOUNTER — Other Ambulatory Visit: Payer: Self-pay

## 2023-02-10 ENCOUNTER — Inpatient Hospital Stay: Payer: Medicaid Other

## 2023-02-10 ENCOUNTER — Inpatient Hospital Stay
Admission: EM | Admit: 2023-02-10 | Discharge: 2023-02-11 | DRG: 638 | Disposition: A | Discharge status: Home / Self Care | Payer: Medicaid Other | Attending: Internal Medicine | Admitting: Internal Medicine

## 2023-02-10 DIAGNOSIS — Z716 Tobacco abuse counseling: Secondary | ICD-10-CM | POA: Diagnosis not present

## 2023-02-10 DIAGNOSIS — D72829 Elevated white blood cell count, unspecified: Secondary | ICD-10-CM | POA: Diagnosis present

## 2023-02-10 DIAGNOSIS — E1010 Type 1 diabetes mellitus with ketoacidosis without coma: Principal | ICD-10-CM | POA: Diagnosis present

## 2023-02-10 DIAGNOSIS — F32A Depression, unspecified: Secondary | ICD-10-CM | POA: Diagnosis present

## 2023-02-10 DIAGNOSIS — J45909 Unspecified asthma, uncomplicated: Secondary | ICD-10-CM | POA: Diagnosis present

## 2023-02-10 DIAGNOSIS — E860 Dehydration: Secondary | ICD-10-CM | POA: Diagnosis present

## 2023-02-10 DIAGNOSIS — Z79899 Other long term (current) drug therapy: Secondary | ICD-10-CM | POA: Diagnosis not present

## 2023-02-10 DIAGNOSIS — Z91148 Patient's other noncompliance with medication regimen for other reason: Secondary | ICD-10-CM

## 2023-02-10 DIAGNOSIS — I10 Essential (primary) hypertension: Secondary | ICD-10-CM | POA: Diagnosis present

## 2023-02-10 DIAGNOSIS — R6510 Systemic inflammatory response syndrome (SIRS) of non-infectious origin without acute organ dysfunction: Secondary | ICD-10-CM

## 2023-02-10 DIAGNOSIS — F17200 Nicotine dependence, unspecified, uncomplicated: Secondary | ICD-10-CM | POA: Diagnosis present

## 2023-02-10 DIAGNOSIS — F17210 Nicotine dependence, cigarettes, uncomplicated: Secondary | ICD-10-CM | POA: Diagnosis present

## 2023-02-10 DIAGNOSIS — G40909 Epilepsy, unspecified, not intractable, without status epilepticus: Secondary | ICD-10-CM | POA: Diagnosis present

## 2023-02-10 DIAGNOSIS — E872 Acidosis, unspecified: Secondary | ICD-10-CM | POA: Insufficient documentation

## 2023-02-10 DIAGNOSIS — F419 Anxiety disorder, unspecified: Secondary | ICD-10-CM | POA: Diagnosis present

## 2023-02-10 DIAGNOSIS — E1310 Other specified diabetes mellitus with ketoacidosis without coma: Secondary | ICD-10-CM

## 2023-02-10 DIAGNOSIS — Z833 Family history of diabetes mellitus: Secondary | ICD-10-CM

## 2023-02-10 DIAGNOSIS — E1110 Type 2 diabetes mellitus with ketoacidosis without coma: Secondary | ICD-10-CM | POA: Diagnosis present

## 2023-02-10 DIAGNOSIS — T383X6A Underdosing of insulin and oral hypoglycemic [antidiabetic] drugs, initial encounter: Secondary | ICD-10-CM | POA: Diagnosis present

## 2023-02-10 LAB — RESPIRATORY PANEL BY PCR

## 2023-02-10 LAB — BETA-HYDROXYBUTYRIC ACID
Beta-Hydroxybutyric Acid: >8 mmol/L — ABNORMAL HIGH (ref 0.05–0.27)
Beta-Hydroxybutyric Acid: 3.55 mmol/L — ABNORMAL HIGH (ref 0.05–0.27)
Beta-Hydroxybutyric Acid: 1.08 mmol/L — ABNORMAL HIGH (ref 0.05–0.27)

## 2023-02-10 LAB — URINALYSIS, W/ REFLEX TO CULTURE (INFECTION SUSPECTED)
Bacteria, UA: NONE SEEN
Bilirubin Urine: NEGATIVE
Glucose, UA: >=500 mg/dL — AB
Ketones, ur: 80 mg/dL — AB
Leukocytes,Ua: NEGATIVE
Nitrite: NEGATIVE
Protein, ur: 100 mg/dL — AB
Specific Gravity, Urine: 1.021 (ref 1.005–1.030)
pH: 5 (ref 5.0–8.0)

## 2023-02-10 LAB — BASIC METABOLIC PANEL
Anion gap: 10 (ref 5–15)
Anion gap: 2 — ABNORMAL LOW (ref 5–15)
Anion gap: 10 (ref 5–15)
BUN: 10 mg/dL (ref 6–20)
BUN: 9 mg/dL (ref 6–20)
BUN: 8 mg/dL (ref 6–20)
CO2: 11 mmol/L — ABNORMAL LOW (ref 22–32)
CO2: 20 mmol/L — ABNORMAL LOW (ref 22–32)
CO2: 17 mmol/L — ABNORMAL LOW (ref 22–32)
Calcium: 7.9 mg/dL — ABNORMAL LOW (ref 8.9–10.3)
Calcium: 8 mg/dL — ABNORMAL LOW (ref 8.9–10.3)
Calcium: 8.7 mg/dL — ABNORMAL LOW (ref 8.9–10.3)
Chloride: 111 mmol/L (ref 98–111)
Chloride: 111 mmol/L (ref 98–111)
Chloride: 108 mmol/L (ref 98–111)
Creatinine, Ser: 0.84 mg/dL (ref 0.61–1.24)
Creatinine, Ser: 0.66 mg/dL (ref 0.61–1.24)
Creatinine, Ser: 0.54 mg/dL — ABNORMAL LOW (ref 0.61–1.24)
GFR, Estimated: >60 mL/min (ref >60)
GFR, Estimated: >60 mL/min (ref >60)
GFR, Estimated: >60 mL/min (ref >60)
Glucose, Bld: 204 mg/dL — ABNORMAL HIGH (ref 70–99)
Glucose, Bld: 164 mg/dL — ABNORMAL HIGH (ref 70–99)
Glucose, Bld: 139 mg/dL — ABNORMAL HIGH (ref 70–99)
Potassium: 3.9 mmol/L (ref 3.5–5.1)
Potassium: 3.7 mmol/L (ref 3.5–5.1)
Potassium: 3.6 mmol/L (ref 3.5–5.1)
Sodium: 132 mmol/L — ABNORMAL LOW (ref 135–145)
Sodium: 133 mmol/L — ABNORMAL LOW (ref 135–145)
Sodium: 135 mmol/L (ref 135–145)

## 2023-02-10 LAB — BLOOD GAS, VENOUS
Acid-base deficit: 28.4 mmol/L — ABNORMAL HIGH (ref 0.0–2.0)
Acid-base deficit: 26.1 mmol/L — ABNORMAL HIGH (ref 0.0–2.0)
Acid-base deficit: 15.6 mmol/L — ABNORMAL HIGH (ref 0.0–2.0)
Bicarbonate: 5.5 mmol/L — ABNORMAL LOW (ref 20.0–28.0)
Bicarbonate: 4 mmol/L — ABNORMAL LOW (ref 20.0–28.0)
Bicarbonate: 9.9 mmol/L — ABNORMAL LOW (ref 20.0–28.0)
O2 Saturation: 75 %
O2 Saturation: 73 %
O2 Saturation: 92.7 %
Patient temperature: 37
Patient temperature: 37
Patient temperature: 37
pCO2, Ven: 34 mmHg — ABNORMAL LOW (ref 44–60)
pCO2, Ven: <18 mmHg — CL (ref 44–60)
pCO2, Ven: 23 mmHg — ABNORMAL LOW (ref 44–60)
pH, Ven: <6.95 — CL (ref 7.25–7.43)
pH, Ven: 6.98 — CL (ref 7.25–7.43)
pH, Ven: 7.24 — ABNORMAL LOW (ref 7.25–7.43)
pO2, Ven: 48 mmHg — ABNORMAL HIGH (ref 32–45)
pO2, Ven: 41 mmHg (ref 32–45)
pO2, Ven: 58 mmHg — ABNORMAL HIGH (ref 32–45)

## 2023-02-10 LAB — CBC WITH DIFFERENTIAL/PLATELET
Abs Immature Granulocytes: 0.64 10*3/uL — ABNORMAL HIGH (ref 0.00–0.07)
Basophils Absolute: 0.2 10*3/uL — ABNORMAL HIGH (ref 0.0–0.1)
Basophils Relative: 1 %
Eosinophils Absolute: 0.2 10*3/uL (ref 0.0–0.5)
Eosinophils Relative: 1 %
HCT: 46.3 % (ref 39.0–52.0)
Hemoglobin: 15.7 g/dL (ref 13.0–17.0)
Immature Granulocytes: 3 %
Lymphocytes Relative: 26 %
Lymphs Abs: 5.3 10*3/uL — ABNORMAL HIGH (ref 0.7–4.0)
MCH: 33.2 pg (ref 26.0–34.0)
MCHC: 33.9 g/dL (ref 30.0–36.0)
MCV: 97.9 fL (ref 80.0–100.0)
Monocytes Absolute: 1.2 10*3/uL — ABNORMAL HIGH (ref 0.1–1.0)
Monocytes Relative: 6 %
Neutro Abs: 13.1 10*3/uL — ABNORMAL HIGH (ref 1.7–7.7)
Neutrophils Relative %: 63 %
Platelets: 289 10*3/uL (ref 150–400)
RBC: 4.73 MIL/uL (ref 4.22–5.81)
RDW: 12 % (ref 11.5–15.5)
WBC: 20.5 10*3/uL — ABNORMAL HIGH (ref 4.0–10.5)
nRBC: 0 % (ref 0.0–0.2)

## 2023-02-10 LAB — PROCALCITONIN: Procalcitonin: 0.14 ng/mL

## 2023-02-10 LAB — GLUCOSE, CAPILLARY
Glucose-Capillary: 218 mg/dL — ABNORMAL HIGH (ref 70–99)
Glucose-Capillary: 215 mg/dL — ABNORMAL HIGH (ref 70–99)
Glucose-Capillary: 206 mg/dL — ABNORMAL HIGH (ref 70–99)
Glucose-Capillary: 201 mg/dL — ABNORMAL HIGH (ref 70–99)
Glucose-Capillary: 191 mg/dL — ABNORMAL HIGH (ref 70–99)
Glucose-Capillary: 180 mg/dL — ABNORMAL HIGH (ref 70–99)
Glucose-Capillary: 187 mg/dL — ABNORMAL HIGH (ref 70–99)
Glucose-Capillary: 183 mg/dL — ABNORMAL HIGH (ref 70–99)
Glucose-Capillary: 158 mg/dL — ABNORMAL HIGH (ref 70–99)
Glucose-Capillary: 139 mg/dL — ABNORMAL HIGH (ref 70–99)
Glucose-Capillary: 148 mg/dL — ABNORMAL HIGH (ref 70–99)
Glucose-Capillary: 146 mg/dL — ABNORMAL HIGH (ref 70–99)

## 2023-02-10 LAB — COMPREHENSIVE METABOLIC PANEL
ALT: 16 U/L (ref 0–44)
AST: 19 U/L (ref 15–41)
Albumin: 3.9 g/dL (ref 3.5–5.0)
Alkaline Phosphatase: 87 U/L (ref 38–126)
BUN: 14 mg/dL (ref 6–20)
CO2: <7 mmol/L — ABNORMAL LOW (ref 22–32)
Calcium: 7.7 mg/dL — ABNORMAL LOW (ref 8.9–10.3)
Chloride: 107 mmol/L (ref 98–111)
Creatinine, Ser: 1.15 mg/dL (ref 0.61–1.24)
GFR, Estimated: >60 mL/min (ref >60)
Glucose, Bld: 420 mg/dL — ABNORMAL HIGH (ref 70–99)
Potassium: 4.9 mmol/L (ref 3.5–5.1)
Sodium: 133 mmol/L — ABNORMAL LOW (ref 135–145)
Total Bilirubin: 1.6 mg/dL — ABNORMAL HIGH (ref 0.3–1.2)
Total Protein: 7.9 g/dL (ref 6.5–8.1)

## 2023-02-10 LAB — URINE DRUG SCREEN, QUALITATIVE (ARMC ONLY)
Amphetamines, Ur Screen: NOT DETECTED
Barbiturates, Ur Screen: NOT DETECTED
Benzodiazepine, Ur Scrn: NOT DETECTED
Cannabinoid 50 Ng, Ur ~~LOC~~: POSITIVE — AB
Cocaine Metabolite,Ur ~~LOC~~: NOT DETECTED
MDMA (Ecstasy)Ur Screen: NOT DETECTED
Methadone Scn, Ur: NOT DETECTED
Opiate, Ur Screen: NOT DETECTED
Phencyclidine (PCP) Ur S: NOT DETECTED
Tricyclic, Ur Screen: NOT DETECTED

## 2023-02-10 LAB — CBG MONITORING, ED
Glucose-Capillary: 447 mg/dL — ABNORMAL HIGH (ref 70–99)
Glucose-Capillary: 388 mg/dL — ABNORMAL HIGH (ref 70–99)
Glucose-Capillary: 342 mg/dL — ABNORMAL HIGH (ref 70–99)

## 2023-02-10 LAB — CK: Total CK: 52 U/L (ref 49–397)

## 2023-02-10 LAB — TROPONIN I (HIGH SENSITIVITY)
Troponin I (High Sensitivity): 6 ng/L (ref <18)
Troponin I (High Sensitivity): 7 ng/L (ref <18)

## 2023-02-10 LAB — LACTIC ACID, PLASMA
Lactic Acid, Venous: 7.3 mmol/L (ref 0.5–1.9)
Lactic Acid, Venous: 1.4 mmol/L (ref 0.5–1.9)

## 2023-02-10 LAB — MRSA NEXT GEN BY PCR, NASAL: MRSA by PCR Next Gen: NOT DETECTED

## 2023-02-10 LAB — MAGNESIUM: Magnesium: 1.6 mg/dL — ABNORMAL LOW (ref 1.7–2.4)

## 2023-02-10 LAB — PHOSPHORUS: Phosphorus: 2.2 mg/dL — ABNORMAL LOW (ref 2.5–4.6)

## 2023-02-10 MED ORDER — INSULIN REGULAR(HUMAN) IN NACL 100-0.9 UT/100ML-% IV SOLN
INTRAVENOUS | Status: DC
  Administered 2023-02-10: 4.6 [IU]/h via INTRAVENOUS
  Filled 2023-02-10: qty 100

## 2023-02-10 MED ORDER — DEXTROSE 50 % IV SOLN: 0.0000 mL | INTRAVENOUS | Status: DC | PRN

## 2023-02-10 MED ORDER — LACTATED RINGERS IV BOLUS
1000.0000 mL | Freq: Once | INTRAVENOUS | Status: AC
  Administered 2023-02-10: 1000 mL via INTRAVENOUS

## 2023-02-10 MED ORDER — POTASSIUM CHLORIDE 10 MEQ/100ML IV SOLN
10.0000 meq | INTRAVENOUS | Status: AC
  Administered 2023-02-10: 10 meq via INTRAVENOUS
  Filled 2023-02-10: qty 100

## 2023-02-10 MED ORDER — POTASSIUM CHLORIDE 10 MEQ/100ML IV SOLN
10.0000 meq | INTRAVENOUS | Status: DC
  Administered 2023-02-10: 10 meq via INTRAVENOUS
  Filled 2023-02-10 (×2): qty 100

## 2023-02-10 MED ORDER — INSULIN GLARGINE-YFGN 100 UNIT/ML ~~LOC~~ SOLN
10.0000 [IU] | Freq: Once | SUBCUTANEOUS | Status: AC
  Administered 2023-02-10: 10 [IU] via SUBCUTANEOUS
  Filled 2023-02-10: qty 0.1

## 2023-02-10 MED ORDER — DEXTROSE IN LACTATED RINGERS 5 % IV SOLN: INTRAVENOUS | Status: DC

## 2023-02-10 MED ORDER — DEXTROSE-SODIUM CHLORIDE 5-0.9 % IV SOLN: INTRAVENOUS | Status: DC

## 2023-02-10 MED ORDER — SODIUM CHLORIDE 0.9 % IV BOLUS
1000.0000 mL | Freq: Once | INTRAVENOUS | Status: AC
  Administered 2023-02-10: 1000 mL via INTRAVENOUS

## 2023-02-10 MED ORDER — IOHEXOL 300 MG/ML  SOLN
100.0000 mL | Freq: Once | INTRAMUSCULAR | Status: AC | PRN
  Administered 2023-02-10: 100 mL via INTRAVENOUS

## 2023-02-10 MED ORDER — ALBUTEROL SULFATE (2.5 MG/3ML) 0.083% IN NEBU: 3.0000 mL | INHALATION_SOLUTION | Freq: Four times a day (QID) | RESPIRATORY_TRACT | Status: DC | PRN

## 2023-02-10 MED ORDER — MORPHINE SULFATE (PF) 4 MG/ML IV SOLN
4.0000 mg | Freq: Once | INTRAVENOUS | Status: DC
  Filled 2023-02-10: qty 1

## 2023-02-10 MED ORDER — INSULIN REGULAR(HUMAN) IN NACL 100-0.9 UT/100ML-% IV SOLN
INTRAVENOUS | Status: DC
  Administered 2023-02-10: 10 [IU]/h via INTRAVENOUS
  Filled 2023-02-10: qty 100

## 2023-02-10 MED ORDER — INSULIN ASPART 100 UNIT/ML IJ SOLN
0.0000 [IU] | Freq: Three times a day (TID) | INTRAMUSCULAR | Status: DC
  Administered 2023-02-11: 4 [IU] via SUBCUTANEOUS
  Administered 2023-02-11: 3 [IU] via SUBCUTANEOUS
  Filled 2023-02-10 (×2): qty 1

## 2023-02-10 MED ORDER — LACTATED RINGERS IV SOLN: INTRAVENOUS | Status: DC

## 2023-02-10 MED ORDER — LACTATED RINGERS IV BOLUS: 20.0000 mL/kg | Freq: Once | INTRAVENOUS | Status: DC

## 2023-02-10 MED ORDER — MORPHINE SULFATE (PF) 2 MG/ML IV SOLN
2.0000 mg | Freq: Once | INTRAVENOUS | Status: AC
  Administered 2023-02-10: 2 mg via INTRAVENOUS
  Filled 2023-02-10: qty 1

## 2023-02-10 MED ORDER — NICOTINE 21 MG/24HR TD PT24
21.0000 mg | MEDICATED_PATCH | Freq: Every day | TRANSDERMAL | Status: DC
  Administered 2023-02-11: 21 mg via TRANSDERMAL
  Filled 2023-02-10: qty 1

## 2023-02-10 MED ORDER — SODIUM CHLORIDE 0.9 % IV SOLN: INTRAVENOUS | Status: DC

## 2023-02-10 MED ORDER — ENOXAPARIN SODIUM 40 MG/0.4ML IJ SOSY
40.0000 mg | PREFILLED_SYRINGE | INTRAMUSCULAR | Status: DC
  Filled 2023-02-10: qty 0.4

## 2023-02-10 NOTE — Assessment & Plan Note (Signed)
Does not appear to be on seizure medications at present Monitor

## 2023-02-10 NOTE — Assessment & Plan Note (Signed)
1 pack/day smoker Discussed tobacco cessation Nicotine patch

## 2023-02-10 NOTE — H&P (Addendum)
History and Physical    Patient: Jerome Murphy ZOX:[id number] DOB: 07-May-1991 DOA: 02/10/2023 DOS: the patient was seen and examined on 02/10/2023 PCP: Storm Frisk, MD  Patient coming from: Home  Chief Complaint:  Chief Complaint  Patient presents with   Back Pain   HPI: Jerome Murphy is a 32 y.o. male with medical history significant of tobacco abuse, poorly controlled insulin-dependent diabetes, seizures, narcolepsy, asthma presenting with DKA.  History from patient as well as his mother in the setting of lethargy.  Per report, patient with worsening nausea vomiting and generalized malaise over the past 2 to 3 weeks.  Per report, family recently had passing of a close relative.  Patient has been under a lot of stress per report.  Patient self admits to a poor general diet.  Noted last hemoglobin A1c back in November 2023 at 14.  Noted worsening diet from baseline.  Denies any alcohol use.  1 pack/day smoker.  Blood sugars in the 200s at home chronically.  Positive abdominal pain as well as generalized malaise.  Emesis nonbloody nonbilious.  No reported diarrhea.  No hemiparesis or confusion.  No reported chest pain.  No reported shortness of breath. Presents to the ER afebrile, heart rate in the 100s, blood pressure in the 110s to 120s systolic.  Labs notable for Glucose in 400s, bicarb <7, white count of 20.5, hemoglobin 15.7, VBG with a pH less than 6.95 x 2.  Bicarb around 45.  Positive urine ketones.  Lactate pending.  CT abdomen pelvis pending.  Urine drug screen pending.  BHB at greater than 8. AG 19.  Review of Systems: As mentioned in the history of present illness. All other systems reviewed and are negative. Past Medical History:  Diagnosis Date   Asthma    Diabetes mellitus without complication (HCC)    History of gallstones    Narcolepsy    Seizures (HCC)    Past Surgical History:  Procedure Laterality Date   CIRCUMCISION     HERNIA REPAIR      TONSILLECTOMY     Social History:  reports that he has been smoking cigarettes. He has never used smokeless tobacco. He reports that he does not drink alcohol and does not use drugs.  Allergies  Allergen Reactions   Robitussin [Guaifenesin] Hives and Itching    Family History  Problem Relation Age of Onset   Diabetes Mother    Diabetes Maternal Uncle     Prior to Admission medications   Medication Sig Start Date End Date Taking? Authorizing Provider  albuterol (VENTOLIN HFA) 108 (90 Base) MCG/ACT inhaler Inhale 2 puffs into the lungs every 6 (six) hours as needed for wheezing or shortness of breath. 02/10/21   Nita Sickle, MD  Blood Glucose Monitoring Suppl (TRUE METRIX METER) w/Device KIT Use to test blood sugar before meals and at bedtime. 07/10/22   Enedina Finner, MD  Continuous Glucose Sensor (FREESTYLE LIBRE 3 SENSOR) MISC Place 1 sensor on the skin every 14 days. Use to check glucose continuously 01/12/23   Storm Frisk, MD  glucose blood test strip Use to test blood sugar before meals and at bedtime. 07/10/22   Enedina Finner, MD  Insulin Lispro Prot & Lispro (HUMALOG 75/25 MIX) (75-25) 100 UNIT/ML Kwikpen Inject 20 Units into the skin 2 (two) times daily. 08/05/22   Hoy Register, MD  Insulin Pen Needle (PEN NEEDLES 3/16") 31G X 5 MM MISC Use 2 times a day 07/10/22   Enedina Finner,  MD  TRUEplus Lancets 28G MISC Use to test blood sugar before meals and at bedtime. 07/10/22   Enedina Finner, MD    Physical Exam: Vitals:   02/10/23 0800 02/10/23 0805 02/10/23 0830 02/10/23 0900  BP: (!) 123/58  (!) 99/48   Pulse: (!) 105  (!) 106 90  Resp: (!) 21  (!) 29 (!) 27  Temp:  (!) 97.4 F (36.3 C)    TempSrc:  Axillary    SpO2: 100%  100% 100%  Weight:      Height:       Physical Exam Constitutional:      Appearance: He is normal weight.     Comments: Mild generalized lethargy   HENT:     Head: Normocephalic.     Mouth/Throat:     Mouth: Mucous membranes are dry.  Eyes:      Pupils: Pupils are equal, round, and reactive to light.  Cardiovascular:     Rate and Rhythm: Normal rate and regular rhythm.  Pulmonary:     Effort: Pulmonary effort is normal.  Abdominal:     General: Bowel sounds are normal.  Musculoskeletal:        General: Normal range of motion.  Skin:    General: Skin is dry.  Neurological:     General: No focal deficit present.  Psychiatric:        Mood and Affect: Mood normal.     Data Reviewed:  There are no new results to review at this time. Lab Results  Component Value Date   WBC 20.5 (H) 02/10/2023   HGB 15.7 02/10/2023   HCT 46.3 02/10/2023   MCV 97.9 02/10/2023   PLT 289 02/10/2023   Last metabolic panel Lab Results  Component Value Date   GLUCOSE 420 (H) 02/10/2023   NA 133 (L) 02/10/2023   K 4.9 02/10/2023   CL 107 02/10/2023   CO2 <7 (L) 02/10/2023   BUN 14 02/10/2023   CREATININE 1.15 02/10/2023   GFRNONAA >60 02/10/2023   CALCIUM 7.7 (L) 02/10/2023   PROT 7.9 02/10/2023   ALBUMIN 3.9 02/10/2023   LABGLOB 2.5 08/28/2020   AGRATIO 1.8 08/28/2020   BILITOT 1.6 (H) 02/10/2023   ALKPHOS 87 02/10/2023   AST 19 02/10/2023   ALT 16 02/10/2023   ANIONGAP NOT CALCULATED 02/10/2023    Assessment and Plan: * DKA (diabetic ketoacidosis) (HCC) Patient with severe DKA based on blood sugar in 400s, pH less than 6.95 on blood gas, positive urine ketones Markedly dry on exam Will start on aggressive IV fluid hydration with DKA protocol Prn IV bicarb for persistent lactic acidosis  Critical care made aware of case as an FYI     SIRS (systemic inflammatory response syndrome) (HCC) Patient meeting SIRS criteria based on heart rate 100s, white count of 20 on presentation No overt source of infection noted in the setting of DKA Will panculture for now CT of the abdomen pelvis pending in the setting of persistent abdominal pain Suspect dehydration as predominant component of tachycardia and leukocytosis Will otherwise  monitor for now Reassess if there is any clinical decline  Tobacco dependence 1 pack/day smoker Discussed tobacco cessation Nicotine patch  Metabolic acidosis Severe metabolic acidosis with noted pH of less than 6.95 and bicarb less than 4 in the setting of DKA AB 19 Lactate pending Treat per DKA protocol As needed IV bicarb as clinically appropriate   Seizure disorder (HCC) Does not appear to be on seizure medications at present  Monitor  Leukocytosis White count 20 on presentation the setting of marked dehydration associated with DKA Suspect hemoconcentration No overt source of infection noted at present Will monitor and trend with hydration and treatment Follow    Greater than 50% was spent in counseling and coordination of care with patient Total encounter time 80 minutes or more    Advance Care Planning:   Code Status: Full Code   Consults: Critical care   Family Communication: Mother at the bedside   Severity of Illness: The appropriate patient status for this patient is INPATIENT. Inpatient status is judged to be reasonable and necessary in order to provide the required intensity of service to ensure the patient's safety. The patient's presenting symptoms, physical exam findings, and initial radiographic and laboratory data in the context of their chronic comorbidities is felt to place them at high risk for further clinical deterioration. Furthermore, it is not anticipated that the patient will be medically stable for discharge from the hospital within 2 midnights of admission.   * I certify that at the point of admission it is my clinical judgment that the patient will require inpatient hospital care spanning beyond 2 midnights from the point of admission due to high intensity of service, high risk for further deterioration and high frequency of surveillance required.*  Author: Floydene Flock, MD 02/10/2023 11:03 AM  For on call review [email or web address].

## 2023-02-10 NOTE — Assessment & Plan Note (Addendum)
Severe metabolic acidosis with noted pH of less than 6.95 and bicarb less than 4 in the setting of DKA AB 19 Lactate pending Treat per DKA protocol As needed IV bicarb as clinically appropriate

## 2023-02-10 NOTE — Assessment & Plan Note (Signed)
Patient meeting SIRS criteria based on heart rate 100s, white count of 20 on presentation No overt source of infection noted in the setting of DKA Will panculture for now CT of the abdomen pelvis pending in the setting of persistent abdominal pain Suspect dehydration as predominant component of tachycardia and leukocytosis Will otherwise monitor for now Reassess if there is any clinical decline

## 2023-02-10 NOTE — ED Notes (Signed)
Family member at bedside, convinced pt to take ordered morphine.

## 2023-02-10 NOTE — ED Notes (Signed)
ICU MD at bedside

## 2023-02-10 NOTE — ED Notes (Signed)
Dark green tube sent to lab for beta hydroxy.

## 2023-02-10 NOTE — ED Notes (Signed)
Per endotool stay at 10 units' hr

## 2023-02-10 NOTE — Inpatient Diabetes Management (Addendum)
Inpatient Diabetes Program Recommendations  AACE/ADA: New Consensus Statement on Inpatient Glycemic Control (2015)  Target Ranges:  Prepandial:   less than 140 mg/dL      Peak postprandial:   less than 180 mg/dL (1-2 hours)      Critically ill patients:  140 - 180 mg/dL   Lab Results  Component Value Date   GLUCAP 388 (H) 02/10/2023   HGBA1C 13.9 (H) 05/18/2022    Latest Reference Range & Units 02/10/23 08:20  Sodium 135 - 145 mmol/L 133 (L)  Potassium 3.5 - 5.1 mmol/L 4.9  Chloride 98 - 111 mmol/L 107  CO2 22 - 32 mmol/L <7 (L)  Glucose 70 - 99 mg/dL 865 (H)  BUN 6 - 20 mg/dL 14  Creatinine 7.84 - 6.96 mg/dL 2.95  Calcium 8.9 - 28.4 mg/dL 7.7 (L)  Anion gap 5 - 15  NOT CALCULATED  (L): Data is abnormally low (H): Data is abnormally high  Review of Glycemic Control  Diabetes history: DM1 Outpatient Diabetes medications: 75/25 20 units bid Current orders for Inpatient glycemic control: IV insulin  Inpatient Diabetes Program Recommendations:   Noted no insulin x 2 days due to running out. Patient was seen by DM coordinator on prior admission 07/10/22 and DM1 discussed and need to take insulin every day since he was diagnosed as type 1 diabetes. Patient has Medicaid listed so insulin should be covered @ minimal cost and had been receiving through Medication assistance. Will follow during hospitalization.  12:30 pm Spoke with patient @ bedside. Patient tearful speaking of the past week due to his close aunt illness and passing. States he does have insulin and gets from [facility] but has not been taking regularly. Reviewed need to take insulin as prescribed and reviewed DKA in type 1 diabetes. Patient acknowledges understanding. Patient had a libre 3 that his mom has been helping him to pay for, but he got a new phone and the sensor lost connection. Provided patient with 4 new Libre 3 sensors for home use until he is able to afford and able to get additional sensors.     Thank you, Billy Fischer. Nyiesha Beever, RN, MSN, CDE  Diabetes Coordinator Inpatient Glycemic Control Team Team Pager [phone] (8am-5pm) 02/10/2023 11:00 AM

## 2023-02-10 NOTE — ED Notes (Signed)
Pt appears to be in severe pain, does not want morphine. Pt complaining of feeling very cold. Blankets provided. Pt barely cooperative with IV start for beta hydroxy lab.

## 2023-02-10 NOTE — Assessment & Plan Note (Signed)
White count 20 on presentation the setting of marked dehydration associated with DKA Suspect hemoconcentration No overt source of infection noted at present Will monitor and trend with hydration and treatment Follow

## 2023-02-10 NOTE — ED Notes (Signed)
Per Dr Alvester Morin still give the ordered 2 potassium runs. Also duplicate labs can be canceled and BMP redrawn at 4 hours.

## 2023-02-10 NOTE — ED Triage Notes (Signed)
Pt presents to ER via ems with c/o hyperglycemia, n/v, abd pain and back pain that started yesterday, but has been getting worse.  Pt reportedly ran out of insulin 2 days ago.  Pt states back pain is excruciating at this time, and is in both sides of lower back.  Pt states he has been in DKA before, but states it has not felt like this before.  Pt states "my kidneys hurt!"  Pt with kussmaul resp noted in triage.  Pt otherwise A&O x4.    CBG 353 w/ems.

## 2023-02-10 NOTE — ED Notes (Signed)
Cbg 342.

## 2023-02-10 NOTE — ED Notes (Signed)
Lt green tube was sent to lab for rest of ordered labs from this morning.

## 2023-02-10 NOTE — ED Notes (Signed)
Pt in CT.

## 2023-02-10 NOTE — ED Notes (Signed)
RT called, ph is <6.98, Co2 is 17, bicarb is 4. Hospitalist [facility] informed.

## 2023-02-10 NOTE — Consult Note (Signed)
NAME:  Jerome Murphy, MRN:  [id number], DOB:  [birth year], LOS: 0 ADMISSION DATE:  02/10/2023, CONSULTATION DATE:  02/10/2023 REFERRING MD:  Doree Albee, MD, CHIEF COMPLAINT:  DKA   History of Present Illness:  Jerome Murphy is a pleasant 32 year old male with a past medical history of T1DM who presents to the hospital with weakness and abdominal pain. He is diagnosed with DKA and admitted to the internal medicine service for further management. Critical care is consulted given severe acidemia.  Patient reports a history of T1DM with previous history of DKA. He's maintained on insulin injections and monitors his glucose with a continuous monitor. He's not been compliant with his insulin over the past week secondary to a multitude of psychosocial stressors. He's felt increasingly weak, anorexic, and developed back pain and abdominal pain. Patient presented to the ED this morning, and blood work is notable for severe hyperglycemia as well as acidemia. He is started on DKA protocol with insulin gtt and potassium repletion was initiated. Patient denied focal signs of infection, with no increased cough, sputum production, dysuria or diarrhea. He does report tachypnea and increased breathlessness.  Notable blood work include a potassium of 4.9, pH of 6.98, glucose of 420, CO2 of 7, and BHB of > 8. Imaging included CT of the abdomen/pelvis without signs of focal infection or abnormality.  Pertinent  Medical History  -T1DM, admit for DKA 06/2022 -Asthma -Anxiety  Objective   Blood pressure (!) 99/48, pulse 90, temperature (!) 97.4 F (36.3 C), temperature source Axillary, resp. rate (!) 27, height 5\' 6"  (1.676 m), weight 65.8 kg, SpO2 100%.        Intake/Output Summary (Last 24 hours) at 02/10/2023 1049 Last data filed at 02/10/2023 1049 Gross per 24 hour  Intake 3100 ml  Output 500 ml  Net 2600 ml   Filed Weights   02/10/23 0623  Weight: 65.8 kg    Examination: Physical  Exam Constitutional:      General: He is not in acute distress.    Appearance: He is ill-appearing and toxic-appearing.  HENT:     Head: Normocephalic.     Mouth/Throat:     Mouth: Mucous membranes are dry.  Cardiovascular:     Pulses: Normal pulses.     Heart sounds: Normal heart sounds.  Pulmonary:     Effort: Pulmonary effort is normal.     Breath sounds: Normal breath sounds.  Abdominal:     Palpations: Abdomen is soft.  Neurological:     General: No focal deficit present.     Mental Status: He is alert and oriented to person, place, and time. Mental status is at baseline.     Assessment & Plan:    Neurology #Anxiety #Depression  Reports multitude of psychosocial stressors prior to DKA, resulting in non-compliance.  -consider consult to social work  Cardiovascular  Hemodynamically stable, but he is dehydrated given DKA. Continues to receive fluid resuscitation. Goal MAP > 65 mmHg. Troponin was normal and no signs of ACS on EKG.  Pulmonary Tachypnea is compensatory in the setting of metabolic acidosis. Saturating at 100% on room air. Will obtain chest xray.  Gastrointestinal #Nausea and Vomiting  In the setting of DKA. Resume diet once gap closed. Imaging of the abdomen and pelvis does not identify any potential precipitants for DKA (no pancreatitis, no gut ischemia, no free fluid).  Renal #Metabolic Acidosis  Secondary to accumulation of ketones; he is being volume resuscitated with close monitoring of electrolytes.  Potassium was 4.9, will be monitored per DKA protocol. Pseudohyponatremia corrects to 138-141.  Endocrine #DKA  Initiated on insulin gtt and IV fluids with LR per protocol. Potential precipitants include infection (see ID below), insulin non-adherence (likely given history), inflammation (unlikely), infarction (unlikely, negative trop, abdominal CT normal, neurological status intact), intoxication (drug screen pending), and iatrogenic  (unlikely).  -Continue insulin/electrolyte repletion/fluid resuscitation per DKA protocol.  Hem/Onc  Lovenox for DVT prophylaxis ordered  ID Blood cultures sent and pending, will obtain viral panel in search of alternative causes of DKA.   Best Practice (right click and "Reselect all SmartList Selections" daily)   Diet/type: NPO DVT prophylaxis: LMWH GI prophylaxis: N/A Lines: N/A Foley:  N/A Code Status:  full code Last date of multidisciplinary goals of care discussion [02/10/2023]  Labs   CBC: Recent Labs  Lab 02/10/23 0658  WBC 20.5*  NEUTROABS 13.1*  HGB 15.7  HCT 46.3  MCV 97.9  PLT 289    Basic Metabolic Panel: Recent Labs  Lab 02/10/23 0820  NA 133*  K 4.9  CL 107  CO2 <7*  GLUCOSE 420*  BUN 14  CREATININE 1.15  CALCIUM 7.7*   GFR: Estimated Creatinine Clearance: 84 mL/min (by C-G formula based on SCr of 1.15 mg/dL). Recent Labs  Lab 02/10/23 0658  WBC 20.5*    Liver Function Tests: Recent Labs  Lab 02/10/23 0820  AST 19  ALT 16  ALKPHOS 87  BILITOT 1.6*  PROT 7.9  ALBUMIN 3.9   No results for input(s): "LIPASE", "AMYLASE" in the last 168 hours. No results for input(s): "AMMONIA" in the last 168 hours.  ABG    Component Value Date/Time   HCO3 4.0 (L) 02/10/2023 1000   ACIDBASEDEF 26.1 (H) 02/10/2023 1000   O2SAT 73 02/10/2023 1000     Coagulation Profile: No results for input(s): "INR", "PROTIME" in the last 168 hours.  Cardiac Enzymes: Recent Labs  Lab 02/10/23 0725  CKTOTAL 52    HbA1C: Hemoglobin A1C  Date/Time Value Ref Range Status  06/26/2021 11:25 AM 13.9 (A) 4.0 - 5.6 % Final  11/25/2020 12:18 PM 8.2 (A) 4.0 - 5.6 % Final   Hgb A1c MFr Bld  Date/Time Value Ref Range Status  05/18/2022 02:28 PM 13.9 (H) 4.8 - 5.6 % Final    Comment:    (NOTE) Pre diabetes:          5.7%-6.4%  Diabetes:              >6.4%  Glycemic control for   <7.0% adults with diabetes   10/24/2019 10:36 AM 8.4 (H) <5.7 % of  total Hgb Final    Comment:    For someone without known diabetes, a hemoglobin A1c value of 6.5% or greater indicates that they may have  diabetes and this should be confirmed with a follow-up  test. . For someone with known diabetes, a value <7% indicates  that their diabetes is well controlled and a value  greater than or equal to 7% indicates suboptimal  control. A1c targets should be individualized based on  duration of diabetes, age, comorbid conditions, and  other considerations. . Currently, no consensus exists regarding use of hemoglobin A1c for diagnosis of diabetes for children. .     CBG: Recent Labs  Lab 02/10/23 0635 02/10/23 1006  GLUCAP 447* 388*    Past Medical History:  He,  has a past medical history of Asthma, Diabetes mellitus without complication (HCC), History of gallstones, Narcolepsy, and Seizures (  HCC).   Surgical History:   Past Surgical History:  Procedure Laterality Date   CIRCUMCISION     HERNIA REPAIR     TONSILLECTOMY       Social History:   reports that he has been smoking cigarettes. He has never used smokeless tobacco. He reports that he does not drink alcohol and does not use drugs.   Family History:  His family history includes Diabetes in his maternal uncle and mother.   Allergies Allergies  Allergen Reactions   Robitussin [Guaifenesin] Hives and Itching     Home Medications  Prior to Admission medications   Medication Sig Start Date End Date Taking? Authorizing Provider  albuterol (VENTOLIN HFA) 108 (90 Base) MCG/ACT inhaler Inhale 2 puffs into the lungs every 6 (six) hours as needed for wheezing or shortness of breath. 02/10/21   Nita Sickle, MD  Blood Glucose Monitoring Suppl (TRUE METRIX METER) w/Device KIT Use to test blood sugar before meals and at bedtime. 07/10/22   Enedina Finner, MD  Continuous Glucose Sensor (FREESTYLE LIBRE 3 SENSOR) MISC Place 1 sensor on the skin every 14 days. Use to check glucose  continuously 01/12/23   Storm Frisk, MD  glucose blood test strip Use to test blood sugar before meals and at bedtime. 07/10/22   Enedina Finner, MD  Insulin Lispro Prot & Lispro (HUMALOG 75/25 MIX) (75-25) 100 UNIT/ML Kwikpen Inject 20 Units into the skin 2 (two) times daily. 08/05/22   Hoy Register, MD  Insulin Pen Needle (PEN NEEDLES 3/16") 31G X 5 MM MISC Use 2 times a day 07/10/22   Enedina Finner, MD  TRUEplus Lancets 28G MISC Use to test blood sugar before meals and at bedtime. 07/10/22   Enedina Finner, MD     Critical care time: 30 minutes    Raechel Chute, MD [facility] 02/10/2023 11:12 AM

## 2023-02-10 NOTE — Progress Notes (Signed)
Patient accepted from ER to ICU room 9.  VSS.  Patient alert and oriented.  Denies pain at this time.  Patient assessed, see flowsheets. Oriented patient to room. Call bell placed within reach and demonstrated how to use.

## 2023-02-10 NOTE — ED Provider Notes (Signed)
[facility] Provider Note    Event Date/Time   First MD Initiated Contact with Patient 02/10/23 0701     (approximate)   History   Back Pain   HPI  Jerome Murphy is a 32 y.o. male past medical history significant for hypertension, diabetes, asthma, tobacco use, marijuana use, who presents to the emergency department with back pain, nausea and not feeling well.  Patient states that he has not been feeling well for the past 1 to 2 days.  Started to significantly feel worse with nausea, vomiting, abdominal pain and back pain that started yesterday.  Ran out of insulin past 2 days.  States that he has pain to his back that radiates down his legs.  Intermittently has swelling to his legs and lower extremities but denies any swelling at this time.  Denies any falls or trauma.  States that he has severe cramping to his lower legs.  Denies any dysuria, urinary urgency or frequency.  Denies any significant alcohol use.     Physical Exam   Triage Vital Signs: ED Triage Vitals  Encounter Vitals Group     BP 02/10/23 0622 (!) 143/74     Systolic BP Percentile --      Diastolic BP Percentile --      Pulse Rate 02/10/23 0622 (!) 120     Resp 02/10/23 0622 (!) 26     Temp --      Temp src --      SpO2 02/10/23 0622 99 %     Weight 02/10/23 0623 145 lb (65.8 kg)     Height 02/10/23 0623 5\' 6"  (1.676 m)     Head Circumference --      Peak Flow --      Pain Score 02/10/23 0623 10     Pain Loc --      Pain Education --      Exclude from Growth Chart --     Most recent vital signs: Vitals:   02/10/23 0830 02/10/23 0900  BP: (!) 99/48   Pulse: (!) 106 90  Resp: (!) 29 (!) 27  Temp:    SpO2: 100% 100%    Physical Exam Constitutional:      General: He is in acute distress.     Appearance: He is well-developed. He is ill-appearing.  HENT:     Head: Atraumatic.     Mouth/Throat:     Mouth: Mucous membranes are dry.  Eyes:      Conjunctiva/sclera: Conjunctivae normal.  Cardiovascular:     Rate and Rhythm: Regular rhythm. Tachycardia present.  Pulmonary:     Effort: Respiratory distress present.     Comments: Kussmaul breathing.  Lungs are clear to auscultation Abdominal:     Tenderness: There is abdominal tenderness (RLQ). There is right CVA tenderness and left CVA tenderness.  Musculoskeletal:        General: Normal range of motion.     Cervical back: Normal range of motion.     Right lower leg: No edema.     Left lower leg: No edema.  Skin:    General: Skin is warm.     Capillary Refill: Capillary refill takes more than 3 seconds.  Neurological:     Mental Status: He is alert. Mental status is at baseline.  Psychiatric:        Mood and Affect: Mood is anxious.     IMPRESSION / MDM / ASSESSMENT AND PLAN / ED COURSE  I reviewed the triage vital signs and the nursing notes.  Differential diagnosis including DKA, HHS, dehydration, gastroparesis, pyelonephritis, kidney stone, rhabdomyolysis  EKG  I, Corena Herter, the attending physician, personally viewed and interpreted this ECG.   Rate: 115  Rhythm: Sinus tachycardia  Axis: Normal  Intervals: Normal  ST&T Change: None  Sinus tachycardia while on cardiac telemetry.    LABS (all labs ordered are listed, but only abnormal results are displayed) Labs interpreted as -    Labs Reviewed  CBC WITH DIFFERENTIAL/PLATELET - Abnormal; Notable for the following components:      Result Value   WBC 20.5 (*)    Neutro Abs 13.1 (*)    Lymphs Abs 5.3 (*)    Monocytes Absolute 1.2 (*)    Basophils Absolute 0.2 (*)    Abs Immature Granulocytes 0.64 (*)    All other components within normal limits  BLOOD GAS, VENOUS - Abnormal; Notable for the following components:   pH, Ven <6.95 (*)    pCO2, Ven 34 (*)    pO2, Ven 48 (*)    Bicarbonate 5.5 (*)    Acid-base deficit 28.4 (*)    All other components within normal limits  BETA-HYDROXYBUTYRIC ACID -  Abnormal; Notable for the following components:   Beta-Hydroxybutyric Acid >8.00 (*)    All other components within normal limits  URINALYSIS, W/ REFLEX TO CULTURE (INFECTION SUSPECTED) - Abnormal; Notable for the following components:   Color, Urine STRAW (*)    APPearance CLEAR (*)    Glucose, UA >=500 (*)    Hgb urine dipstick SMALL (*)    Ketones, ur 80 (*)    Protein, ur 100 (*)    All other components within normal limits  COMPREHENSIVE METABOLIC PANEL - Abnormal; Notable for the following components:   Sodium 133 (*)    CO2 <7 (*)    Glucose, Bld 420 (*)    Calcium 7.7 (*)    Total Bilirubin 1.6 (*)    All other components within normal limits  CBG MONITORING, ED - Abnormal; Notable for the following components:   Glucose-Capillary 447 (*)    All other components within normal limits  CK  TROPONIN I (HIGH SENSITIVITY)  TROPONIN I (HIGH SENSITIVITY)     MDM    Clinical picture concerning for DKA.  Hyperglycemia.  Patient with coo small breathing.  Started on 2 L of IV fluids.  Significant leukocytosis of 20.5.  Significant metabolic acidosis with pH reading less than 6.95.  Beta hydroxybutyrate significantly elevated.  Hemolyzed CMP and had to recollect and send a new sample.  Waiting on potassium prior to starting insulin.  Given antiemetics, IV fluids and pain medication.  Given significant lower extremity cramping out of on a CK.  No findings of urinary tract infection.  Troponin negative.  Discussed with lab.  Unable to add on the CKs will have to redraw.  CO2 significantly low at less than 7.  Glucose elevated at 420.  Unable to calculate anion gap.  Sodium 133.  Mild elevation of T. bili but normal LFTs.  Creatinine 1.15 with a GFR greater than 60.  Consulted hospitalist for admission for DKA.  Started on insulin and maintenance fluids with potassium added.   PROCEDURES:  Critical Care performed: yes  .Critical Care  Performed by: Corena Herter,  MD Authorized by: Corena Herter, MD   Critical care provider statement:    Critical care time (minutes):  45   Critical care time was  exclusive of:  Separately billable procedures and treating other patients   Critical care was necessary to treat or prevent imminent or life-threatening deterioration of the following conditions:  Endocrine crisis   Critical care was time spent personally by me on the following activities:  Development of treatment plan with patient or surrogate, discussions with consultants, evaluation of patient's response to treatment, examination of patient, ordering and review of laboratory studies, ordering and review of radiographic studies, ordering and performing treatments and interventions, pulse oximetry, re-evaluation of patient's condition and review of old charts   Patient's presentation is most consistent with acute presentation with potential threat to life or bodily function.   MEDICATIONS ORDERED IN ED: Medications  insulin regular, human (MYXREDLIN) 100 units/ 100 mL infusion (has no administration in time range)  dextrose 5 % in lactated ringers infusion (has no administration in time range)  dextrose 50 % solution 0-50 mL (has no administration in time range)  potassium chloride 10 mEq in 100 mL IVPB (has no administration in time range)  lactated ringers infusion (has no administration in time range)  sodium chloride 0.9 % bolus 1,000 mL (0 mLs Intravenous Stopped 02/10/23 0748)  sodium chloride 0.9 % bolus 1,000 mL (0 mLs Intravenous Stopped 02/10/23 0827)  morphine (PF) 2 MG/ML injection 2 mg (2 mg Intravenous Given 02/10/23 0747)  lactated ringers bolus 1,000 mL (1,000 mLs Intravenous New Bag/Given 02/10/23 0900)  morphine (PF) 2 MG/ML injection 2 mg (2 mg Intravenous Given 02/10/23 0902)    FINAL CLINICAL IMPRESSION(S) / ED DIAGNOSES   Final diagnoses:  Diabetic ketoacidosis without coma associated with type 1 diabetes mellitus (HCC)     Rx / DC  Orders   ED Discharge Orders     None        Note:  This document was prepared using Dragon voice recognition software and may include unintentional dictation errors.   Corena Herter, MD 02/10/23 [phone]

## 2023-02-10 NOTE — Assessment & Plan Note (Signed)
Patient with severe DKA based on blood sugar in 400s, pH less than 6.95 on blood gas, positive urine ketones Markedly dry on exam Will start on aggressive IV fluid hydration with DKA protocol Prn IV bicarb for persistent lactic acidosis  Critical care made aware of case as an [place]

## 2023-02-11 ENCOUNTER — Telehealth: Payer: Self-pay

## 2023-02-11 ENCOUNTER — Other Ambulatory Visit (HOSPITAL_COMMUNITY): Payer: Self-pay

## 2023-02-11 DIAGNOSIS — E1010 Type 1 diabetes mellitus with ketoacidosis without coma: Secondary | ICD-10-CM

## 2023-02-11 LAB — CBC WITH DIFFERENTIAL/PLATELET
Abs Immature Granulocytes: 0.01 10*3/uL (ref 0.00–0.07)
Basophils Absolute: 0 10*3/uL (ref 0.0–0.1)
Basophils Relative: 1 %
Eosinophils Absolute: 0.2 10*3/uL (ref 0.0–0.5)
Eosinophils Relative: 3 %
HCT: 34.7 % — ABNORMAL LOW (ref 39.0–52.0)
Hemoglobin: 12.8 g/dL — ABNORMAL LOW (ref 13.0–17.0)
Immature Granulocytes: 0 %
Lymphocytes Relative: 30 %
Lymphs Abs: 1.7 10*3/uL (ref 0.7–4.0)
MCH: 33.2 pg (ref 26.0–34.0)
MCHC: 36.9 g/dL — ABNORMAL HIGH (ref 30.0–36.0)
MCV: 90.1 fL (ref 80.0–100.0)
Monocytes Absolute: 0.5 10*3/uL (ref 0.1–1.0)
Monocytes Relative: 8 %
Neutro Abs: 3.5 10*3/uL (ref 1.7–7.7)
Neutrophils Relative %: 58 %
Platelets: 205 10*3/uL (ref 150–400)
RBC: 3.85 MIL/uL — ABNORMAL LOW (ref 4.22–5.81)
RDW: 11.9 % (ref 11.5–15.5)
WBC: 5.8 10*3/uL (ref 4.0–10.5)
nRBC: 0 % (ref 0.0–0.2)

## 2023-02-11 LAB — GLUCOSE, CAPILLARY
Glucose-Capillary: 102 mg/dL — ABNORMAL HIGH (ref 70–99)
Glucose-Capillary: 116 mg/dL — ABNORMAL HIGH (ref 70–99)
Glucose-Capillary: 122 mg/dL — ABNORMAL HIGH (ref 70–99)
Glucose-Capillary: 147 mg/dL — ABNORMAL HIGH (ref 70–99)
Glucose-Capillary: 200 mg/dL — ABNORMAL HIGH (ref 70–99)

## 2023-02-11 MED ORDER — MENTHOL 3 MG MT LOZG
1.0000 | LOZENGE | OROMUCOSAL | Status: DC | PRN
  Administered 2023-02-11: 3 mg via ORAL
  Filled 2023-02-11: qty 9

## 2023-02-11 MED ORDER — POTASSIUM CHLORIDE CRYS ER 20 MEQ PO TBCR
40.0000 meq | EXTENDED_RELEASE_TABLET | Freq: Once | ORAL | Status: AC
  Administered 2023-02-11: 40 meq via ORAL
  Filled 2023-02-11: qty 2

## 2023-02-11 MED ORDER — POTASSIUM CHLORIDE 10 MEQ/100ML IV SOLN
10.0000 meq | INTRAVENOUS | Status: DC
  Administered 2023-02-11: 10 meq via INTRAVENOUS
  Filled 2023-02-11 (×4): qty 100

## 2023-02-11 MED ORDER — K PHOS MONO-SOD PHOS DI & MONO 155-852-130 MG PO TABS
250.0000 mg | ORAL_TABLET | Freq: Once | ORAL | Status: AC
  Administered 2023-02-11: 250 mg via ORAL
  Filled 2023-02-11: qty 1

## 2023-02-11 MED ORDER — MAGNESIUM SULFATE 4 GM/100ML IV SOLN
4.0000 g | Freq: Once | INTRAVENOUS | Status: AC
  Administered 2023-02-11: 4 g via INTRAVENOUS
  Filled 2023-02-11: qty 100

## 2023-02-11 MED ORDER — INSULIN GLARGINE-YFGN 100 UNIT/ML ~~LOC~~ SOLN
10.0000 [IU] | Freq: Two times a day (BID) | SUBCUTANEOUS | Status: DC
  Administered 2023-02-11: 10 [IU] via SUBCUTANEOUS
  Filled 2023-02-11 (×2): qty 0.1

## 2023-02-11 NOTE — Addendum Note (Signed)
Addended by: Storm Frisk on: 02/11/2023 03:46 PM   Modules accepted: Orders

## 2023-02-11 NOTE — TOC Transition Note (Signed)
Transition of Care [facility]) - CM/SW Discharge Note   Patient Details  Name: Jerome Murphy MRN: [id number] Date of Birth: 04-Mar-1991  [facility] CM/SW Contact:  Darleene Cleaver, LCSW Phone Number: 02/11/2023, 2:22 PM   Clinical Narrative:     CSW received consult for patient needing medication assistance.  Patient has Medicaid, TOC can not assist with medications assistance.  TOC signing off please reconsult if other TOC needs arise.   Final next level of care: Home/Self Care Barriers to Discharge: Barriers Resolved   Patient Goals and CMS Choice      Discharge Placement                      Patient and family notified of of transfer: 02/11/23  Discharge Plan and Services Additional resources added to the After Visit Summary for                                       Social Determinants of Health (SDOH) Interventions SDOH Screenings   Food Insecurity: No Food Insecurity (02/10/2023)  Housing: Low Risk  (02/10/2023)  Transportation Needs: No Transportation Needs (02/10/2023)  Utilities: Not At Risk (02/10/2023)  Alcohol Screen: Low Risk  (10/24/2019)  Depression (PHQ2-9): Medium Risk (11/26/2020)  Tobacco Use: High Risk (02/10/2023)     Readmission Risk Interventions     No data to display

## 2023-02-11 NOTE — Telephone Encounter (Addendum)
Patients mother called to schedule a hospital f/u, appt made for 8/8 while I was OTP with her she had several questions about the patients medications including if he can get prescribed gabapentin stated he was previously on it but was taken off. Also stated he is on a short acting insulin but wants to know if he can be prescribed the long acting insulin before his appt and that he needed a referral to endocrinology for a pump.

## 2023-02-11 NOTE — Progress Notes (Signed)
Jerome Murphy to be D/C'd Home per MD order.  Discussed prescriptions and follow up appointments with the patient. Prescriptions given to patient, medication list explained in detail. Pt verbalized understanding.  Allergies as of 02/11/2023       Reactions   Robitussin [guaifenesin] Hives, Itching        Medication List     TAKE these medications    albuterol 108 (90 Base) MCG/ACT inhaler Commonly known as: VENTOLIN HFA Inhale 2 puffs into the lungs every 6 (six) hours as needed for wheezing or shortness of breath.   FreeStyle Libre 3 Sensor Misc Place 1 sensor on the skin every 14 days. Use to check glucose continuously   HumaLOG Mix 75/25 KwikPen (75-25) 100 UNIT/ML KwikPen Generic drug: Insulin Lispro Prot & Lispro Inject 20 Units into the skin 2 (two) times daily. What changed: how much to take   TechLite Pen Needles 31G X 5 MM Misc Generic drug: Insulin Pen Needle Use 2 times a day   True Metrix Blood Glucose Test test strip Generic drug: glucose blood Use to test blood sugar before meals and at bedtime.   True Metrix Meter w/Device Kit Use to test blood sugar before meals and at bedtime.   TRUEplus Lancets 28G Misc Use to test blood sugar before meals and at bedtime.        Vitals:   02/11/23 1300 02/11/23 1500  BP:  95/62  Pulse: 70 73  Resp: 18 17  Temp:  97.8 F (36.6 C)  SpO2: 98% 100%    Skin clean, dry and intact without evidence of skin break down, no evidence of skin tears noted. IV catheter discontinued intact. Site without signs and symptoms of complications. Dressing and pressure applied. Pt denies pain at this time. No complaints noted.  An After Visit Summary was printed and given to the patient. Patient escorted via WC, and D/C home via private auto.  Rigoberto Noel

## 2023-02-11 NOTE — Inpatient Diabetes Management (Addendum)
Inpatient Diabetes Program Recommendations  AACE/ADA: New Consensus Statement on Inpatient Glycemic Control (2015)  Target Ranges:  Prepandial:   less than 140 mg/dL      Peak postprandial:   less than 180 mg/dL (1-2 hours)      Critically ill patients:  140 - 180 mg/dL    Latest Reference Range & Units 02/10/23 18:00 02/10/23 18:54 02/10/23 20:01 02/10/23 21:03 02/10/23 22:07 02/10/23 23:07 02/10/23 23:59 02/11/23 01:02 02/11/23 02:09  Glucose-Capillary 70 - 99 mg/dL 161 (H) 096 (H) 045 (H) 139 (H) 148 (H) 146 (H) 122 (H)  10 units Semglee  116 (H) 147 (H)  3 units Novolog  IV Insulin Drip Stopped  (H): Data is abnormally high  Latest Reference Range & Units 02/11/23 07:21  Glucose-Capillary 70 - 99 mg/dL 409 (H)  (H): Data is abnormally high  Admit with: DKA  History: Type 1 Diabetes  Home DM Meds: 75/25 Insulin 30 units BID        Freestyle Libre 3 CGM  Current Orders: Novolog Resistant Correction Scale/ SSI (0-20 units) TID AC + HS    MD- Pt will need Basal Insulin back this AM given History of Type 1 diabetes He received 10 units Semglee at Midnight for transition to SQ Insulin  Please consider starting pt's home pre-mixed Insulin:  Novolog 70/30 Insulin 20 units BID with meals (~75% home dose)    Addendum 8am:  Secure Chat with Dr. Lebron Conners made to start Semglee 10 units BID this AM.  Order placed by MD.   Addendum 10:45am--Met w/ pt at bedside again.  Pt told me he does have insulin at home (showed me his Humalog 75/25 insulin pens).  Has been taking 30 units BID with meals at home.  Sees Dr. Alvis Lemmings with [facility] and gets meds filled at that pharmacy.  Pt told me the PCP office has been texting him to schedule an appt--Strongly encouraged pt to call for follow up appt after d/c and asked pt to please inquire about referral to Endocrinologist.  I re-explained to pt what Type1 diabetes means and that he will need insulin life long for glucose  control.  Stressed the importance of good CBG control to prevent complications.  We also talked about alternative insulin admin options including insulin pumps--I explained how a pump works and that most often ENDO's will prescribe insulin pumps and that he should continue injections until he can get an appt with an ENDO.  I explained that given his age and hx of Type 1 diabetes, an ENDO would be a great asset to have helping him manage his diabetes.  DM RN gave pt 3 Freestyle Libe 3 Sensors yest--Pt has the app on his phone but ran out of more sensors--Has been paying out of pocket for them.  Pt appreciative of visit and did not have any further questions for me at this time.  Addendum 12pm--Per OP Pharmacy: Freestyle Libre 3 CGM should be covered at $0 co-pay.  Phoned pt to make him aware and to instruct him to get refills thru his PCP when ready for refills.     --Will follow patient during hospitalization--  Ambrose Finland RN, MSN, CDCES Diabetes Coordinator Inpatient Glycemic Control Team Team Pager: [phone] (8a-5p)

## 2023-02-11 NOTE — Plan of Care (Signed)
  Problem: Education: Goal: Ability to describe self-care measures that may prevent or decrease complications (Diabetes Survival Skills Education) will improve Outcome: Progressing   Problem: Coping: Goal: Ability to adjust to condition or change in health will improve Outcome: Progressing   Problem: Metabolic: Goal: Ability to maintain appropriate glucose levels will improve Outcome: Progressing   Problem: Nutritional: Goal: Maintenance of adequate nutrition will improve Outcome: Progressing   Problem: Skin Integrity: Goal: Risk for impaired skin integrity will decrease Outcome: Progressing   Problem: Health Behavior/Discharge Planning: Goal: Ability to identify and utilize available resources and services will improve Outcome: Progressing

## 2023-02-11 NOTE — Discharge Summary (Signed)
  Physician Discharge Summary   Patient: Jerome Murphy MRN: [id number] DOB: May 24, 1991  Admit date:     02/10/2023  Discharge date: 02/11/23  Discharge Physician: Loyce Dys   PCP: Storm Frisk, MD     Discharge Diagnoses: DKA (diabetic ketoacidosis) (HCC) SIRS (systemic inflammatory response syndrome) (HCC) Tobacco dependence Metabolic acidosis Seizure disorder (HCC) Leukocytosis reactive in the setting of severe dehydration-resolved   Hospital Course: Jerome Murphy is a 32 y.o. male with medical history significant of tobacco abuse, poorly controlled insulin-dependent diabetes, seizures, narcolepsy, asthma presenting with DKA.  History from patient as well as his mother in the setting of lethargy.  Per report, patient with worsening nausea vomiting and generalized malaise over the past 2 to 3 weeks.  On arrival patient was found to be in DKA and underwent IV fluid and IV insulin management with improvement in acidosis.  CT scan of the abdomen was unrevealing.  Patient therefore cleared for discharge today and to follow-up as an outpatient.   Consultants: None Procedures performed: None Disposition: Home Diet recommendation:  Carb modified diet DISCHARGE MEDICATION: Allergies as of 02/11/2023       Reactions   Robitussin [guaifenesin] Hives, Itching        Medication List     TAKE these medications    albuterol 108 (90 Base) MCG/ACT inhaler Commonly known as: VENTOLIN HFA Inhale 2 puffs into the lungs every 6 (six) hours as needed for wheezing or shortness of breath.   FreeStyle Libre 3 Sensor Misc Place 1 sensor on the skin every 14 days. Use to check glucose continuously   HumaLOG Mix 75/25 KwikPen (75-25) 100 UNIT/ML KwikPen Generic drug: Insulin Lispro Prot & Lispro Inject 20 Units into the skin 2 (two) times daily. What changed: how much to take   TechLite Pen Needles 31G X 5 MM Misc Generic drug: Insulin Pen Needle Use 2 times  a day   True Metrix Blood Glucose Test test strip Generic drug: glucose blood Use to test blood sugar before meals and at bedtime.   True Metrix Meter w/Device Kit Use to test blood sugar before meals and at bedtime.   TRUEplus Lancets 28G Misc Use to test blood sugar before meals and at bedtime.        Discharge Exam: Filed Weights   02/10/23 0623 02/10/23 1120  Weight: 65.8 kg 64.4 kg   HENT:     Head: Normocephalic.     Mouth/Throat:     Mouth: Mucous membranes are wet Eyes:     Pupils: Pupils are equal, round, and reactive to light.  Cardiovascular:     Rate and Rhythm: Normal rate and regular rhythm.  Pulmonary:     Effort: Pulmonary effort is normal.  Abdominal:     General: Bowel sounds are normal.  Musculoskeletal:        General: Normal range of motion.  Skin:    General: Skin is dry.  Neurological:     General: No focal deficit present.  Psychiatric:        Mood and Affect: Mood normal.   Condition at discharge: good     Discharge time spent:  34 minutes.  Signed: Loyce Dys, MD [facility] 02/11/2023

## 2023-02-11 NOTE — Telephone Encounter (Signed)
Keep 8/8 appt he already is on a combination long and short acting insulin given by hospital stay on that for now  I would prefer to see patient before Rx gabapentin  I will call in a referral to endocrinology

## 2023-02-11 NOTE — Telephone Encounter (Signed)
Patient's mother called back and asked if provider could go ahead and send a referral to endocrinology. Please advise.

## 2023-02-11 NOTE — Plan of Care (Signed)
Patient would like to speak with the diabetes coordinator. He is interested in an insulin pump and methods to give him more freedom while managing his disease. He was very responsive to education and seems open and interested in information/getting back on track with management.   He transitioned off the Insulin gtt per Cliffton Asters, NP despite the endotool not suggesting transition yet. Determined off of lab results.  Problem: Education: Goal: Ability to describe self-care measures that may prevent or decrease complications (Diabetes Survival Skills Education) will improve Outcome: Progressing Goal: Individualized Educational Video(s) Outcome: Progressing   Problem: Coping: Goal: Ability to adjust to condition or change in health will improve Outcome: Progressing   Problem: Fluid Volume: Goal: Ability to maintain a balanced intake and output will improve Outcome: Progressing   Problem: Health Behavior/Discharge Planning: Goal: Ability to identify and utilize available resources and services will improve Outcome: Progressing Goal: Ability to manage health-related needs will improve Outcome: Progressing   Problem: Metabolic: Goal: Ability to maintain appropriate glucose levels will improve Outcome: Progressing   Problem: Nutritional: Goal: Maintenance of adequate nutrition will improve Outcome: Progressing Goal: Progress toward achieving an optimal weight will improve Outcome: Progressing   Problem: Skin Integrity: Goal: Risk for impaired skin integrity will decrease Outcome: Progressing   Problem: Tissue Perfusion: Goal: Adequacy of tissue perfusion will improve Outcome: Progressing   Problem: Education: Goal: Ability to describe self-care measures that may prevent or decrease complications (Diabetes Survival Skills Education) will improve Outcome: Progressing Goal: Individualized Educational Video(s) Outcome: Progressing   Problem: Cardiac: Goal: Ability to maintain an  adequate cardiac output will improve Outcome: Progressing   Problem: Health Behavior/Discharge Planning: Goal: Ability to identify and utilize available resources and services will improve Outcome: Progressing Goal: Ability to manage health-related needs will improve Outcome: Progressing   Problem: Fluid Volume: Goal: Ability to achieve a balanced intake and output will improve Outcome: Progressing   Problem: Metabolic: Goal: Ability to maintain appropriate glucose levels will improve Outcome: Progressing   Problem: Nutritional: Goal: Maintenance of adequate nutrition will improve Outcome: Progressing Goal: Maintenance of adequate weight for body size and type will improve Outcome: Progressing   Problem: Respiratory: Goal: Will regain and/or maintain adequate ventilation Outcome: Progressing   Problem: Urinary Elimination: Goal: Ability to achieve and maintain adequate renal perfusion and functioning will improve Outcome: Progressing   Problem: Education: Goal: Knowledge of General Education information will improve Description: Including pain rating scale, medication(s)/side effects and non-pharmacologic comfort measures Outcome: Progressing   Problem: Health Behavior/Discharge Planning: Goal: Ability to manage health-related needs will improve Outcome: Progressing   Problem: Clinical Measurements: Goal: Ability to maintain clinical measurements within normal limits will improve Outcome: Progressing Goal: Will remain free from infection Outcome: Progressing Goal: Diagnostic test results will improve Outcome: Progressing Goal: Respiratory complications will improve Outcome: Progressing Goal: Cardiovascular complication will be avoided Outcome: Progressing   Problem: Activity: Goal: Risk for activity intolerance will decrease Outcome: Progressing   Problem: Nutrition: Goal: Adequate nutrition will be maintained Outcome: Progressing   Problem: Coping: Goal:  Level of anxiety will decrease Outcome: Progressing   Problem: Elimination: Goal: Will not experience complications related to bowel motility Outcome: Progressing Goal: Will not experience complications related to urinary retention Outcome: Progressing   Problem: Pain Managment: Goal: General experience of comfort will improve Outcome: Progressing   Problem: Safety: Goal: Ability to remain free from injury will improve Outcome: Progressing   Problem: Skin Integrity: Goal: Risk for impaired skin integrity will decrease Outcome: Progressing

## 2023-02-11 NOTE — TOC Benefit Eligibility Note (Signed)
Pharmacy Patient Advocate Encounter  Insurance verification completed.    The patient is insured through [employer]   Ran test claim for [employer] and the current 30 day co-pay is $0.00.   This test claim was processed through [facility]- copay amounts may vary at other pharmacies due to pharmacy/plan contracts, or as the patient moves through the different stages of their insurance plan.    Roland Earl, CPHT Pharmacy Patient Advocate Specialist [facility] Pharmacy Patient Advocate Team Direct Number: [phone]  Fax: [phone]

## 2023-02-12 ENCOUNTER — Other Ambulatory Visit: Payer: Self-pay | Admitting: Critical Care Medicine

## 2023-02-12 ENCOUNTER — Other Ambulatory Visit: Payer: Self-pay

## 2023-02-12 DIAGNOSIS — E559 Vitamin D deficiency, unspecified: Secondary | ICD-10-CM

## 2023-02-12 NOTE — Telephone Encounter (Signed)
Called patient unable to make contact or leave voicemail  due to mailbox being full

## 2023-02-16 ENCOUNTER — Telehealth: Payer: Self-pay

## 2023-02-16 ENCOUNTER — Other Ambulatory Visit: Payer: Self-pay

## 2023-02-16 NOTE — Transitions of Care (Post Inpatient/ED Visit) (Signed)
   02/16/2023  Name: Jerome Murphy MRN: [id number] DOB: [birth year]  Today's TOC FU Call Status: Today's TOC FU Call Status:: Successful TOC FU Call Completed TOC FU Call Complete Date: 02/16/23  Transition Care Management Follow-up Telephone Call Date of Discharge: 02/11/23 Discharge Facility: [facility]) Type of Discharge: Inpatient Admission Primary Inpatient Discharge Diagnosis:: DKA How have you been since you were released from the hospital?: Better Any questions or concerns?: Yes Patient Questions/Concerns:: He explained that he has been experiencing some swelling of both legs from knees to feet and his right great toe has some numbness/ tingling , slowing him down when he is walking.Marland Kitchen He stated that he put compression stockings on last night and that helped.  The swelling has decreased some this morning. He said this swelling has happened in the past when he sitting down for awhile or in the car. I told him that I would share his concern with Dr Delford Field and I stressed the importanceo of keeping his appointment on 02/18/2023. Patient Questions/Concerns Addressed: Notified Provider of Patient Questions/Concerns  Items Reviewed: Did you receive and understand the discharge instructions provided?: Yes Medications obtained,verified, and reconciled?: Yes (Medications Reviewed) (He said he has all of his medications as well as a [employer] 3.  He stated that he has been taking 30 units of Humalog 75/25 twice daily instead of 20 units twice daily.) Any new allergies since your discharge?: No Dietary orders reviewed?: Yes Type of Diet Ordered:: heart healthy, diabetic.  He has an appointment with the dietician- 03/29/2023 Do you have support at home?: Yes  Medications Reviewed Today: Medications Reviewed Today   Medications were not reviewed in this encounter     Home Care and Equipment/Supplies: Were Home Health Services Ordered?: No Any new  equipment or medical supplies ordered?: No  Functional Questionnaire: Do you need assistance with bathing/showering or dressing?: No Do you need assistance with meal preparation?: No Do you need assistance with eating?: No Do you have difficulty maintaining continence: No Do you need assistance with getting out of bed/getting out of a chair/moving?: No Do you have difficulty managing or taking your medications?: No  Follow up appointments reviewed: PCP Follow-up appointment confirmed?: Yes Date of PCP follow-up appointment?: 02/18/23 Follow-up Provider: Dr Delford Field [facility] Follow-up appointment confirmed?: Yes Date of Specialist follow-up appointment?: 04/14/23 Follow-Up Specialty Provider:: endocrinology.  I provided him with the address for the clinic Do you need transportation to your follow-up appointment?: No Do you understand care options if your condition(s) worsen?: Yes-patient verbalized understanding    SIGNATURE Robyne Peers, RN

## 2023-02-16 NOTE — Telephone Encounter (Signed)
FYI- From the [facility] call:    :He explained that he has been experiencing some swelling of both legs from knees to feet and his right great toe has some numbness/ tingling , slowing him down when he is walking.Marland Kitchen He stated that he put compression stockings on last night and that helped.  The swelling has decreased some this morning. He said this swelling has happened in the past when he sitting down for awhile or in the car. I told him that I would share his concern with Dr Delford Field and I stressed the importanceo of keeping his appointment on 02/18/2023.  He said he has all of his medications as well as a [employer] 3.  He stated that he has been taking 30 units of Humalog 75/25 twice daily instead of 20 units twice daily.

## 2023-02-18 ENCOUNTER — Ambulatory Visit: Payer: Medicaid Other | Admitting: Endocrinology

## 2023-02-18 ENCOUNTER — Encounter: Payer: Self-pay | Admitting: Critical Care Medicine

## 2023-02-18 ENCOUNTER — Other Ambulatory Visit (HOSPITAL_COMMUNITY): Payer: Self-pay

## 2023-02-18 ENCOUNTER — Other Ambulatory Visit: Payer: Self-pay

## 2023-02-18 ENCOUNTER — Ambulatory Visit: Payer: Medicaid Other | Attending: Critical Care Medicine | Admitting: Critical Care Medicine

## 2023-02-18 VITALS — BP 117/73 | HR 93 | Ht 66.0 in | Wt 146.8 lb

## 2023-02-18 DIAGNOSIS — E1010 Type 1 diabetes mellitus with ketoacidosis without coma: Secondary | ICD-10-CM

## 2023-02-18 DIAGNOSIS — G629 Polyneuropathy, unspecified: Secondary | ICD-10-CM | POA: Diagnosis not present

## 2023-02-18 DIAGNOSIS — E782 Mixed hyperlipidemia: Secondary | ICD-10-CM

## 2023-02-18 DIAGNOSIS — Z794 Long term (current) use of insulin: Secondary | ICD-10-CM

## 2023-02-18 DIAGNOSIS — E1065 Type 1 diabetes mellitus with hyperglycemia: Secondary | ICD-10-CM

## 2023-02-18 DIAGNOSIS — E1069 Type 1 diabetes mellitus with other specified complication: Secondary | ICD-10-CM | POA: Diagnosis not present

## 2023-02-18 DIAGNOSIS — E1142 Type 2 diabetes mellitus with diabetic polyneuropathy: Secondary | ICD-10-CM

## 2023-02-18 MED ORDER — INSULIN ASPART 100 UNIT/ML IJ SOLN
8.0000 [IU] | Freq: Once | INTRAMUSCULAR | Status: AC
Start: 2023-02-18 — End: 2023-02-18
  Administered 2023-02-18: 8 [IU] via SUBCUTANEOUS

## 2023-02-18 MED ORDER — PEN NEEDLES 3/16" 31G X 5 MM MISC
4 refills | Status: DC
Start: 2023-02-18 — End: 2023-03-04
  Filled 2023-02-18: qty 100, fill #0
  Filled 2023-02-18 (×4): qty 100, 34d supply, fill #0

## 2023-02-18 MED ORDER — INSULIN LISPRO PROT & LISPRO (75-25 MIX) 100 UNIT/ML KWIKPEN
30.0000 [IU] | PEN_INJECTOR | Freq: Two times a day (BID) | SUBCUTANEOUS | 3 refills | Status: DC
Start: 2023-02-18 — End: 2023-05-27
  Filled 2023-02-18 (×6): qty 30, 50d supply, fill #0
  Filled 2023-03-18 – 2023-03-29 (×2): qty 30, 50d supply, fill #1

## 2023-02-18 MED ORDER — INSULIN LISPRO PROT & LISPRO (75-25 MIX) 100 UNIT/ML KWIKPEN
30.0000 [IU] | PEN_INJECTOR | Freq: Two times a day (BID) | SUBCUTANEOUS | 3 refills | Status: DC
Start: 2023-02-18 — End: 2023-02-18
  Filled 2023-02-18: qty 30, 50d supply, fill #0

## 2023-02-18 MED ORDER — GABAPENTIN 300 MG PO CAPS
300.0000 mg | ORAL_CAPSULE | Freq: Three times a day (TID) | ORAL | 3 refills | Status: DC
  Filled 2023-02-18: qty 90, 30d supply, fill #0
  Filled 2023-05-03: qty 90, 30d supply, fill #1
  Filled 2023-06-02: qty 90, 30d supply, fill #2
  Filled 2023-07-26: qty 90, 30d supply, fill #3

## 2023-02-18 NOTE — Progress Notes (Signed)
Established Patient Office Visit  Subjective   Patient ID: Jerome Murphy, male    DOB: 04-27-1991  Age: 32 y.o. MRN: [id number]  Chief Complaint  Patient presents with   Hospitalization Follow-up    Post hosp ov  not seen by Jerome Murphy since 2022 This is a 32 year old male history of type 1 diabetes last seen by me in 2022.  Patient was lost to follow-up until patient saw Jerome Murphy January 2024 as documented below. Note patient was a no-show for an appointment with me in 2023.  Patient was then hospitalized recently discharged just on 1 August after an overnight stay.  He was given intravenous insulin his gap was closed he was educated as to reapplying his continuous glucose monitor which he brings with him today.  The receiver is on his phone.  He was told to uptitrate his insulin he was sent out on 20 units twice daily 75/25 Humalog.  His insulin supply was put into a refrigerator that was not operating and went bad.  He has been off his insulin for 4 days.  Blood sugar today is 350 on his sensor.  Note he has been trying to get in with endocrinology he has had 3 different referrals made has not been able to achieve this.  He did not have insurance at that time now has Medicaid healthy blue and he was not aware of this.  He is accompanied by his mother.  Her name is Jerome Murphy.  Clinic note January 2024 as below 1. Type 1 diabetes mellitus with other specified complication (HCC) Counseled that he has Type 1 DM not type 2 based on labs - positive GAD 65, islet cell ab Educated that insulin and not oral medications is recommended for management of Type ! DM  He will benefit from an insulin pump He does have a CGM but is not currently wearing his sensor. Discussed significance of time in range, management of hypoglycemia and he has been advised to uptitrate insulin by 2 units bid if sugars are still elevated and down titrate by 2 units bid with hypoglycemia Referred to Endocrine - POCT  glucose (manual entry) - Ambulatory referral to Endocrinology - Insulin Lispro Prot & Lispro (HUMALOG 75/25 MIX) (75-25) 100 UNIT/ML Kwikpen; Inject 20 Units into the skin 2 (two) times daily.  Dispense: 30 mL; Refill: 3   Admission discharged recently for DKA as below Admit date:     02/10/2023 Discharge date: 02/11/23 Discharge Physician: Jerome Murphy   PCP: Jerome Frisk, MD        Discharge Diagnoses: DKA (diabetic ketoacidosis) (HCC) SIRS (systemic inflammatory response syndrome) (HCC) Tobacco dependence Metabolic acidosis Seizure disorder (HCC) Leukocytosis reactive in the setting of severe dehydration-resolved     Hospital Course: Jerome Murphy is a 32 y.o. male with medical history significant of tobacco abuse, poorly controlled insulin-dependent diabetes, seizures, narcolepsy, asthma presenting with DKA.  History from patient as well as his mother in the setting of lethargy.  Per report, patient with worsening nausea vomiting and generalized malaise over the past 2 to 3 weeks.  On arrival patient was found to be in DKA and underwent IV fluid and IV insulin management with improvement in acidosis.  CT scan of the abdomen was unrevealing.  Patient therefore cleared for discharge today and to follow-up as an outpatient.         Review of Systems  Constitutional:  Negative for chills, diaphoresis, fever, malaise/fatigue and weight loss.  HENT:  Negative for congestion, hearing loss, nosebleeds, sore throat and tinnitus.   Eyes:  Negative for blurred vision, photophobia and redness.  Respiratory:  Negative for cough, hemoptysis, sputum production, shortness of breath, wheezing and stridor.   Cardiovascular:  Negative for chest pain, palpitations, orthopnea, claudication, leg swelling and PND.  Gastrointestinal:  Negative for abdominal pain, blood in stool, constipation, diarrhea, heartburn, nausea and vomiting.  Genitourinary:  Negative for dysuria, flank  pain, frequency, hematuria and urgency.  Musculoskeletal:  Negative for back pain, falls, joint pain, myalgias and neck pain.  Skin:  Negative for itching and rash.  Neurological:  Positive for tingling and sensory change. Negative for dizziness, tremors, speech change, focal weakness, seizures, loss of consciousness, weakness and headaches.  Endo/Heme/Allergies:  Negative for environmental allergies and polydipsia. Does not bruise/bleed easily.  Psychiatric/Behavioral:  Negative for depression, memory loss, substance abuse and suicidal ideas. The patient is not nervous/anxious and does not have insomnia.       Objective:     BP 117/73 (BP Location: Left Arm, Patient Position: Sitting, Cuff Size: Normal)   Pulse 93   Ht 5\' 6"  (1.676 m)   Wt 146 lb 12.8 oz (66.6 kg)   SpO2 98%   BMI 23.69 kg/m    Physical Exam Vitals reviewed.  Constitutional:      Appearance: Normal appearance. He is well-developed. He is not diaphoretic.     Comments: Thin  HENT:     Head: Normocephalic and atraumatic.     Nose: No nasal deformity, septal deviation, mucosal edema or rhinorrhea.     Right Sinus: No maxillary sinus tenderness or frontal sinus tenderness.     Left Sinus: No maxillary sinus tenderness or frontal sinus tenderness.     Mouth/Throat:     Pharynx: No oropharyngeal exudate.  Eyes:     General: No scleral icterus.    Conjunctiva/sclera: Conjunctivae normal.     Pupils: Pupils are equal, round, and reactive to light.  Neck:     Thyroid: No thyromegaly.     Vascular: No carotid bruit or JVD.     Trachea: Trachea normal. No tracheal tenderness or tracheal deviation.  Cardiovascular:     Rate and Rhythm: Normal rate and regular rhythm.     Chest Wall: PMI is not displaced.     Pulses: Normal pulses. No decreased pulses.     Heart sounds: Normal heart sounds, S1 normal and S2 normal. Heart sounds not distant. No murmur heard.    No systolic murmur is present.     No diastolic murmur  is present.     No friction rub. No gallop. No S3 or S4 sounds.  Pulmonary:     Effort: No tachypnea, accessory muscle usage or respiratory distress.     Breath sounds: No stridor. No decreased breath sounds, wheezing, rhonchi or rales.  Chest:     Chest wall: No tenderness.  Abdominal:     General: Bowel sounds are normal. There is no distension.     Palpations: Abdomen is soft. Abdomen is not rigid.     Tenderness: There is no abdominal tenderness. There is no guarding or rebound.  Musculoskeletal:        General: Normal range of motion.     Cervical back: Normal range of motion and neck supple. No edema, erythema or rigidity. No muscular tenderness. Normal range of motion.     Comments: Anterior foot numbness  Lymphadenopathy:     Head:     Right  side of head: No submental or submandibular adenopathy.     Left side of head: No submental or submandibular adenopathy.     Cervical: No cervical adenopathy.  Skin:    General: Skin is warm and dry.     Coloration: Skin is not pale.     Findings: No rash.     Nails: There is no clubbing.  Neurological:     Mental Status: He is alert and oriented to person, place, and time.     Sensory: No sensory deficit.  Psychiatric:        Speech: Speech normal.        Behavior: Behavior normal.      No results found for any visits on 02/18/23.    The ASCVD Risk score (Arnett DK, et al., 2019) failed to calculate for the following reasons:   The 2019 ASCVD risk score is only valid for ages 72 to 58    Assessment & Plan:   Problem List Items Addressed This Visit       Endocrine   Uncontrolled type 1 diabetes mellitus with hyperglycemia (HCC)    Increase Humalog to 30 units twice daily and get this patient into endocrinology as soon as possible refills on insulin pens made and also freestyle libre sensors.  As we are finishing the encounter the patient received a call from endocrinology they will see him 3 PM today he will go to this  appointment as it is in the same building as our clinic  We gave the patient 16 units of regular insulin and this brought his blood sugar down from 380 down to less than 300      Relevant Medications   Insulin Lispro Prot & Lispro (HUMALOG 75/25 MIX) (75-25) 100 UNIT/ML Kwikpen   RESOLVED: Type 2 diabetes mellitus (HCC)   Relevant Medications   Insulin Lispro Prot & Lispro (HUMALOG 75/25 MIX) (75-25) 100 UNIT/ML Kwikpen   Other Relevant Orders   Urine microalbumin-creatinine with uACR   Comprehensive metabolic panel with eGFR (no eGFR if sent to [facility])   RESOLVED: DKA (diabetic ketoacidosis) (HCC)    Resolved but high risk for recurrence      Relevant Medications   Insulin Lispro Prot & Lispro (HUMALOG 75/25 MIX) (75-25) 100 UNIT/ML Kwikpen     Nervous and Auditory   Neuropathy    Due to uncontrolled type 1 diabetes begin gabapentin        Other   Mixed hyperlipidemia    Reassess lipids      Other Visit Diagnoses     Type 1 diabetes mellitus with other specified complication (HCC)    -  Primary   Relevant Medications   insulin aspart (novoLOG) injection 8 Units (Completed)   insulin aspart (novoLOG) injection 8 Units (Completed)   Insulin Lispro Prot & Lispro (HUMALOG 75/25 MIX) (75-25) 100 UNIT/ML Kwikpen   Other Relevant Orders   Ambulatory referral to Endocrinology   Lipid panel     60 minutes spent assessing diabetes partnering with pharmacy and getting patient into endocrinology  Return in about 1 month (around 03/21/2023) for diabetes.    Shan Levans, MD

## 2023-02-18 NOTE — Assessment & Plan Note (Signed)
Increase Humalog to 30 units twice daily and get this patient into endocrinology as soon as possible refills on insulin pens made and also freestyle libre sensors.  As we are finishing the encounter the patient received a call from endocrinology they will see him 3 PM today he will go to this appointment as it is in the same building as our clinic  We gave the patient 16 units of regular insulin and this brought his blood sugar down from 380 down to less than 300

## 2023-02-18 NOTE — Patient Instructions (Addendum)
Start gabapentin one three times daily for neuropathy  Increase insulin to 30 units twice daily  Return 1 one week to see our pharmacist Franky Macho whom you met today  Referral to [facility] was made, be on lookout for their call, they will also call your mother  Refills on freestyle libre sensors given  You have healthy [employer] today urine for protein, blood work for kidney and cholesterol  Return Dr Delford Field 1 month

## 2023-02-18 NOTE — Assessment & Plan Note (Signed)
Resolved but high risk for recurrence

## 2023-02-18 NOTE — Assessment & Plan Note (Signed)
Due to uncontrolled type 1 diabetes begin gabapentin

## 2023-02-18 NOTE — Assessment & Plan Note (Signed)
Reassess lipids  

## 2023-03-04 ENCOUNTER — Other Ambulatory Visit: Payer: Self-pay

## 2023-03-04 ENCOUNTER — Encounter: Payer: Self-pay | Admitting: Endocrinology

## 2023-03-04 ENCOUNTER — Ambulatory Visit: Payer: Medicaid Other | Admitting: Endocrinology

## 2023-03-04 VITALS — BP 122/80 | HR 87 | Ht 66.0 in | Wt 154.2 lb

## 2023-03-04 DIAGNOSIS — R809 Proteinuria, unspecified: Secondary | ICD-10-CM

## 2023-03-04 DIAGNOSIS — E1040 Type 1 diabetes mellitus with diabetic neuropathy, unspecified: Secondary | ICD-10-CM

## 2023-03-04 DIAGNOSIS — E1029 Type 1 diabetes mellitus with other diabetic kidney complication: Secondary | ICD-10-CM

## 2023-03-04 DIAGNOSIS — E109 Type 1 diabetes mellitus without complications: Secondary | ICD-10-CM | POA: Insufficient documentation

## 2023-03-04 MED ORDER — "PEN NEEDLES 3/16"" 31G X 5 MM MISC"
4 refills | Status: AC
  Filled 2023-03-04: qty 300, fill #0
  Filled 2023-03-18 – 2023-03-29 (×2): qty 100, 25d supply, fill #0
  Filled 2023-05-03: qty 100, 25d supply, fill #1
  Filled 2023-06-02 – 2023-06-15 (×2): qty 100, 25d supply, fill #2
  Filled 2023-07-09 – 2023-07-26 (×2): qty 100, 25d supply, fill #3

## 2023-03-04 MED ORDER — NOVOLOG FLEXPEN 100 UNIT/ML ~~LOC~~ SOPN
PEN_INJECTOR | SUBCUTANEOUS | 3 refills | Status: DC
  Filled 2023-03-04: qty 9, 30d supply, fill #0
  Filled 2023-04-08: qty 9, 30d supply, fill #1
  Filled 2023-05-04: qty 6, 25d supply, fill #2

## 2023-03-04 MED ORDER — INSULIN GLARGINE 100 UNIT/ML SOLOSTAR PEN
30.0000 [IU] | PEN_INJECTOR | Freq: Every day | SUBCUTANEOUS | 3 refills | Status: DC
  Filled 2023-03-04: qty 9, 30d supply, fill #0
  Filled 2023-04-08: qty 9, 30d supply, fill #1

## 2023-03-04 NOTE — Progress Notes (Signed)
Outpatient Endocrinology Note [name], MD  03/06/23  Patient's Name: Jerome Murphy    DOB: 07-24-1990    MRN: [id number]                                                    REASON OF VISIT: New consult for evaluation of type 1 diabetes mellitus  REFERRING PROVIDER: Storm Frisk, MD  PCP: Storm Frisk, MD  HISTORY OF PRESENT ILLNESS:   Jerome Murphy is a 32 y.o. old male with past medical history listed below, is here for evaluation of type 1 diabetes mellitus.   Pertinent Diabetes History: Patient was diagnosed with diabetes mellitus initially treated as type II in May 2019.  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  He was initially treated with metformin and later insulin therapy was added, compliance had been the issue in the past.  He has uncontrolled type 1 diabetes mellitus hemoglobin A1c in the range of 8 to 14.5%.  He was recently hospitalized due to diabetes ketoacidosis and hemoglobin A1c was 14.5% in February 10, 2023.   Latest Reference Range & Units 05/18/22 20:00  Insulin Antibodies, Human uU/mL 46 (H)  Glutamic Acid Decarb Ab 0.0 - 5.0 U/mL 1,013.1 (H)  C-Peptide 1.1 - 4.4 ng/mL 0.4 (L)  (H): Data is abnormally high (L): Data is abnormally low  Chronic Diabetes Complications : Retinopathy: unknown. Last ophthalmology exam was done on Due Nephropathy: microalbuminuria + Peripheral neuropathy: yes, on gabapentin Coronary artery disease: no Stroke: no  Relevant comorbidities and cardiovascular risk factors: Obesity: no Body mass index is 24.89 kg/m.  Hypertension: no Hyperlipidemia. no  Current / Home Diabetic regimen includes: Humalog mix 75/25 : 30 units 2 times a day.  He sometimes takes correctional dose as well. He reports compliance.  Prior diabetic  medications: Metformin.   Glycemic data:    CONTINUOUS GLUCOSE MONITORING SYSTEM (CGMS) INTERPRETATION: At today's visit, we reviewed CGM downloads. The full report is scanned in the media. Reviewing the CGM trends, blood glucose are as follows:  FreeStyle Libre 3 CGM-  Sensor Download (Sensor download was reviewed and summarized below.) Dates: August 9 to March 04, 2023, 14 days Sensor Average: 311  Glucose Management Indicator: 10.7% Glucose Variability: 28.7%  % data captured: 75%  Glycemic Trends:  <54: 0% 54-70: 1% 71-180: 10% 181-250: 14% 251-400: 75%  Interpretation: -Majority of the time he is having hyperglycemia with most of the blood sugar in the range of 300s to 400.  Intermittently he is having trending down blood sugar seems to be related with taking insulin usually in the late morning and until 2 to 5 PM.  He is mostly having hyperglycemia starting from evening , then overnight and until early morning.  He had 1 episode of hypoglycemia around 6 to 8 PM on August 10 with blood sugar up to 59, 65.  Hypoglycemia: Patient has minor hypoglycemic episodes. Patient has hypoglycemia awareness.  Factors modifying glucose control: 1.  Diabetic diet assessment: three meals a day, started to eat more of vegetable / greens and meats, Gatorade zero. Cut down on potato, starch but still eating breads.   2.  Staying active or  exercising: active at work  3.  Medication compliance: compliant most of the time.  Interval history 03/06/23 Patient reports lately he has been compliant with diet and insulin therapy.  He is concerned about significant weight loss in the last 12 to 14 months discussed that is more likely related with uncontrolled diabetes mellitus and hyperglycemia.  Freestyle libre 3 CGM data as reviewed above.  Mostly hyperglycemia and GMI representing 10.7% which has improved compared to A1c of 14.5 percent from July.  He is interested in insulin pump, and leaning  toward tubeless OmniPod.  Discussed about mechanism of how the pump work.  He does not know carb counting.  Discussed about automatic function of the pump and also requirement of patient interaction especially with bolusing insulin for meals.   REVIEW OF SYSTEMS As per history of present illness.   PAST MEDICAL HISTORY: Past Medical History:  Diagnosis Date   Asthma    Diabetes mellitus without complication (HCC)    DKA (diabetic ketoacidosis) (HCC) 05/18/2022   History of gallstones    Narcolepsy    Seizures (HCC)     PAST SURGICAL HISTORY: Past Surgical History:  Procedure Laterality Date   CIRCUMCISION     HERNIA REPAIR     TONSILLECTOMY      ALLERGIES: Allergies  Allergen Reactions   Robitussin [Guaifenesin] Hives and Itching    FAMILY HISTORY:  Family History  Problem Relation Age of Onset   Diabetes Mother    Diabetes Maternal Uncle     SOCIAL HISTORY: Social History   Socioeconomic History   Marital status: Single    Spouse name: Not on file   Number of children: 2   Years of education: Not on file   Highest education level: Associate degree: occupational, Scientist, product/process development, or vocational program  Occupational History   Occupation: Barbershop  Tobacco Use   Smoking status: Every Day    Current packs/day: 0.25    Types: Cigarettes   Smokeless tobacco: Never  Vaping Use   Vaping status: Never Used  Substance and Sexual Activity   Alcohol use: No    Comment: occasionallly   Drug use: No   Sexual activity: Yes    Birth control/protection: Condom    Comment: Partner uses Depo  Other Topics Concern   Not on file  Social History Narrative   Not on file   Social Determinants of Health   Financial Resource Strain: Not on file  Food Insecurity: No Food Insecurity (02/10/2023)   Hunger Vital Sign    Worried About Running Out of Food in the Last Year: Never true    Ran Out of Food in the Last Year: Never true  Transportation Needs: No Transportation Needs  (02/10/2023)   PRAPARE - Administrator, Civil Service (Medical): No    Lack of Transportation (Non-Medical): No  Physical Activity: Not on file  Stress: Not on file  Social Connections: Not on file    MEDICATIONS:  Current Outpatient Medications  Medication Sig Dispense Refill   albuterol (VENTOLIN HFA) 108 (90 Base) MCG/ACT inhaler Inhale 2 puffs into the lungs every 6 (six) hours as needed for wheezing or shortness of breath. 8 g 2   Continuous Glucose Sensor (FREESTYLE LIBRE 3 SENSOR) MISC Place 1 sensor on the skin every 14 days. Use to check glucose continuously 2 each 4   gabapentin (NEURONTIN) 300 MG capsule Take 1 capsule (300 mg total) by mouth 3 (three) times daily. 90 capsule 3  insulin aspart (NOVOLOG FLEXPEN) 100 UNIT/ML FlexPen Take 5-10 units with meals three times day, plus mild sliding scale. Mild Sliding Scale Blood Glucose        Insulin 60-150                     None 151-200                   None 201-250                   2 units 251-300                   4 units 301-350                   6 units 351-400                   8 units      >400                        9 units and call provider 30 mL 3   insulin glargine (LANTUS) 100 UNIT/ML Solostar Pen Inject 30 Units into the skin daily. 30 mL 3   Insulin Lispro Prot & Lispro (HUMALOG 75/25 MIX) (75-25) 100 UNIT/ML Kwikpen Inject 30 Units into the skin 2 (two) times daily. 30 mL 3   Insulin Pen Needle (PEN NEEDLES 3/16") 31G X 5 MM MISC Use 4 times a day 300 each 4   No current facility-administered medications for this visit.    PHYSICAL EXAM: Vitals:   03/04/23 1403  BP: 122/80  Pulse: 87  SpO2: 98%  Weight: 154 lb 3.2 oz (69.9 kg)  Height: 5\' 6"  (1.676 m)   Body mass index is 24.89 kg/m.  Wt Readings from Last 3 Encounters:  03/04/23 154 lb 3.2 oz (69.9 kg)  02/18/23 146 lb 12.8 oz (66.6 kg)  02/10/23 141 lb 15.6 oz (64.4 kg)    General: Well developed, well nourished male in no  apparent distress.  HEENT: AT/[place], no external lesions.  Eyes: Conjunctiva clear and no icterus. Neck: Neck supple  Lungs: Respirations not labored Neurologic: Alert, oriented, normal speech Extremities / Skin: Dry. No sores or rashes noted. No acanthosis nigricans Psychiatric: Does not appear depressed or anxious  Diabetic Foot Exam - Simple   No data filed    LABS Reviewed Lab Results  Component Value Date   HGBA1C 14.5 (H) 02/10/2023   HGBA1C 13.9 (H) 05/18/2022   HGBA1C 13.9 (A) 06/26/2021   No results found for: "FRUCTOSAMINE" Lab Results  Component Value Date   CHOL 132 08/28/2020   HDL 37 (L) 08/28/2020   LDLCALC 82 08/28/2020   TRIG 63 08/28/2020   CHOLHDL 3.6 08/28/2020   Lab Results  Component Value Date   MICRALBCREAT 81 (H) 02/18/2023   MICRALBCREAT 4 11/26/2020   Lab Results  Component Value Date   CREATININE 0.92 03/04/2023   Lab Results  Component Value Date   GFR 110.92 03/04/2023    ASSESSMENT / PLAN  1. 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 Type 1 diabetes mellitus with diabetic neuropathy, unspecified (HCC)     Diabetes Mellitus type 1, complicated by microalbuminuria / neuropathy - Diabetic status / severity: poorly controlled.   Lab Results  Component Value Date   HGBA1C 14.5 (H) 02/10/2023    - Hemoglobin A1c goal : <7%  Discussed about  type 1 diabetes mellitus, potential chronic complications and importance of controlling blood sugar.  He is committed for diabetes care.  He would like to be compliant with insulin therapy and diet.  He is interested in insulin pump /with tubeless OmniPod.  He needs basic training about pump and carb counting.  Adjusted diabetes regimen as follows.  Will stop Humalog mix 75/25 and start on basal bolus regimen as follows.  Once the patient is prepared, will consider for OmniPod 5 insulin pump therapy.  - Medications:  Diabetes regimen:  Stop Humalog mix 75/25  Lantus  30 units daily in the morning.   Novolog take 5-10 units with meals three times day, plus mild sliding scale. Mild Sliding Scale Blood Glucose        Insulin 60-150                     None 151-200                   None 201-250                   2 units 251-300                   4 units 301-350                   6 units 351-400                   8 units      >400                        9 units and call provider   Will refer to diabetes educator for basic pump training and carb counting.   - Home glucose testing: Freestyle libre 3/check as needed. - Discussed/ Gave Hypoglycemia treatment plan.  # Consult : not required at this time.   # Annual urine for microalbuminuria/ creatinine ratio, no microalbuminuria currently. Last  Lab Results  Component Value Date   MICRALBCREAT 81 (H) 02/18/2023    # Foot check nightly / neuropathy, continue gabapentin.  # Annual dilated diabetic eye exams, will refer to ophthalmology in the follow-up visit.  - Diet: Make healthy diabetic food choices - Life style / activity / exercise: Discussed.  2. Blood pressure  -  BP Readings from Last 1 Encounters:  03/04/23 122/80    - Control is in target.  - No change in current plans.  3. Lipid status / Hyperlipidemia - Last  Lab Results  Component Value Date   [facility] 82 08/28/2020   Skylar was seen today for new patient (initial visit).  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 -     insulin aspart (NOVOLOG FLEXPEN) 100 UNIT/ML FlexPen; Take 5-10 units with meals three times day, plus mild sliding scale. Mild Sliding Scale Blood Glucose         Insulin 60-150                     None 151-200  None 201-250                   2 units 251-300                   4 units 301-350                   6 units 351-400                   8 units      >400                        9 units and call provider -     Insulin Pen Needle (PEN NEEDLES 3/16") 31G X 5 MM MISC; Use 4 times a day    DISPOSITION Follow up in clinic in 6 weeks suggested.   All questions answered and patient verbalized understanding of the plan.  [place] Keimani Laufer, MD [facility] [address] [place], [address] [place], [place] [address] Phone # [phone]  At least part of this note was generated using voice recognition software. Inadvertent word errors may have occurred, which were not recognized during the proofreading process.

## 2023-03-04 NOTE — Patient Instructions (Addendum)
Diabetes regimen:  Lantus 30 units daily in the morning.   Novolog take 5-10 units with meals three times day, plus mild sliding scale. Mild Sliding Scale Blood Glucose        Insulin 60-150                     None 151-200                   None 201-250                   2 units 251-300                   4 units 301-350                   6 units 351-400                   8 units      >400                        9 units and call provider   Will refer to diabetes educator.

## 2023-03-05 LAB — BASIC METABOLIC PANEL
BUN: 12 mg/dL (ref 6–23)
CO2: 27 meq/L (ref 19–32)
Calcium: 9.2 mg/dL (ref 8.4–10.5)
Chloride: 100 meq/L (ref 96–112)
Creatinine, Ser: 0.92 mg/dL (ref 0.40–1.50)
GFR: 110.92 mL/min (ref >60.00)
Glucose, Bld: 360 mg/dL — ABNORMAL HIGH (ref 70–99)
Potassium: 4 meq/L (ref 3.5–5.1)
Sodium: 135 meq/L (ref 135–145)

## 2023-03-06 ENCOUNTER — Encounter: Payer: Self-pay | Admitting: Endocrinology

## 2023-03-09 ENCOUNTER — Encounter: Payer: Self-pay | Admitting: Endocrinology

## 2023-03-18 ENCOUNTER — Other Ambulatory Visit: Payer: Self-pay

## 2023-03-18 ENCOUNTER — Emergency Department
Admission: EM | Admit: 2023-03-18 | Discharge: 2023-03-18 | Discharge status: Against Medical Advice | Payer: Medicaid Other | Attending: Emergency Medicine | Admitting: Emergency Medicine

## 2023-03-18 ENCOUNTER — Encounter: Payer: Self-pay | Admitting: Emergency Medicine

## 2023-03-18 DIAGNOSIS — R1030 Lower abdominal pain, unspecified: Secondary | ICD-10-CM | POA: Insufficient documentation

## 2023-03-18 DIAGNOSIS — Z5321 Procedure and treatment not carried out due to patient leaving prior to being seen by health care provider: Secondary | ICD-10-CM | POA: Insufficient documentation

## 2023-03-18 DIAGNOSIS — R112 Nausea with vomiting, unspecified: Secondary | ICD-10-CM | POA: Diagnosis not present

## 2023-03-18 LAB — COMPREHENSIVE METABOLIC PANEL
ALT: 17 U/L (ref 0–44)
AST: 12 U/L — ABNORMAL LOW (ref 15–41)
Albumin: 4.6 g/dL (ref 3.5–5.0)
Alkaline Phosphatase: 92 U/L (ref 38–126)
Anion gap: 15 (ref 5–15)
BUN: 12 mg/dL (ref 6–20)
CO2: 18 mmol/L — ABNORMAL LOW (ref 22–32)
Calcium: 9.3 mg/dL (ref 8.9–10.3)
Chloride: 99 mmol/L (ref 98–111)
Creatinine, Ser: 1.04 mg/dL (ref 0.61–1.24)
GFR, Estimated: >60 mL/min (ref >60)
Glucose, Bld: 181 mg/dL — ABNORMAL HIGH (ref 70–99)
Potassium: 4.2 mmol/L (ref 3.5–5.1)
Sodium: 132 mmol/L — ABNORMAL LOW (ref 135–145)
Total Bilirubin: 1.3 mg/dL — ABNORMAL HIGH (ref 0.3–1.2)
Total Protein: 8.7 g/dL — ABNORMAL HIGH (ref 6.5–8.1)

## 2023-03-18 LAB — CBC
HCT: 52.5 % — ABNORMAL HIGH (ref 39.0–52.0)
Hemoglobin: 18.3 g/dL — ABNORMAL HIGH (ref 13.0–17.0)
MCH: 32.9 pg (ref 26.0–34.0)
MCHC: 34.9 g/dL (ref 30.0–36.0)
MCV: 94.4 fL (ref 80.0–100.0)
Platelets: 260 10*3/uL (ref 150–400)
RBC: 5.56 MIL/uL (ref 4.22–5.81)
RDW: 12.1 % (ref 11.5–15.5)
WBC: 5.7 10*3/uL (ref 4.0–10.5)
nRBC: 0 % (ref 0.0–0.2)

## 2023-03-18 LAB — LIPASE, BLOOD: Lipase: 25 U/L (ref 11–51)

## 2023-03-18 LAB — CBG MONITORING, ED: Glucose-Capillary: 173 mg/dL — ABNORMAL HIGH (ref 70–99)

## 2023-03-18 MED ORDER — ONDANSETRON 4 MG PO TBDP
4.0000 mg | ORAL_TABLET | Freq: Once | ORAL | Status: AC | PRN
  Administered 2023-03-18: 4 mg via ORAL
  Filled 2023-03-18: qty 1

## 2023-03-18 NOTE — ED Notes (Signed)
Pts family member came up to the window and asked "how many people are ahead of this pt." This NT told the family member about 3. Pt asked family member to take them home. Pt left.

## 2023-03-18 NOTE — ED Notes (Signed)
 Pt denied being able to produce a urine sample at this time. Pt provided with a labeled specimen cup and instructions to return cup to triage nurse desk once it has a clean catch urine sample.

## 2023-03-18 NOTE — ED Notes (Signed)
CBG - 173 

## 2023-03-18 NOTE — ED Triage Notes (Addendum)
Pt presents ambulatory to triage via POV with complaints of lower abdominal pain with associated N/V x 3 days. Rates the pain 10/10 - no meds taken PTA - Hx of DM. A&Ox4 at this time. Denies CP or SOB.   CBG in triage - 173

## 2023-03-22 NOTE — Group Note (Deleted)

## 2023-03-24 ENCOUNTER — Other Ambulatory Visit: Payer: Self-pay

## 2023-03-29 ENCOUNTER — Other Ambulatory Visit: Payer: Self-pay

## 2023-03-29 ENCOUNTER — Encounter: Payer: Medicaid Other | Attending: Critical Care Medicine | Admitting: Dietician

## 2023-03-29 ENCOUNTER — Encounter: Payer: Self-pay | Admitting: Dietician

## 2023-03-29 VITALS — Ht 71.0 in | Wt 161.0 lb

## 2023-03-29 DIAGNOSIS — Z713 Dietary counseling and surveillance: Secondary | ICD-10-CM | POA: Insufficient documentation

## 2023-03-29 DIAGNOSIS — E1065 Type 1 diabetes mellitus with hyperglycemia: Secondary | ICD-10-CM

## 2023-03-29 DIAGNOSIS — E108 Type 1 diabetes mellitus with unspecified complications: Secondary | ICD-10-CM | POA: Diagnosis not present

## 2023-03-29 DIAGNOSIS — E559 Vitamin D deficiency, unspecified: Secondary | ICD-10-CM | POA: Diagnosis not present

## 2023-03-29 DIAGNOSIS — E119 Type 2 diabetes mellitus without complications: Secondary | ICD-10-CM | POA: Diagnosis present

## 2023-03-29 NOTE — Patient Instructions (Addendum)
Per MD on 03/04/2023: Stop Humalog mix 75/25   Lantus 30 units daily in the morning.    Novolog take 5-10 units with meals three times day, plus mild sliding scale. Mild Sliding Scale Blood Glucose        Insulin 60-150                     None 151-200                   None 201-250                   2 units 251-300                   4 units 301-350                   6 units 351-400                   8 units      >400                        9 units and call provider   Take 2000 units (50 mcg) Vitamin D3 every day  Begin practicing counting carbohydrates.  I would like to see you first available.

## 2023-03-29 NOTE — Progress Notes (Signed)
Diabetes Self-Management Education  Visit Type: First/Initial  Appt. Start Time: 55 (patient arrived late) Appt. End Time: 1125  03/29/2023  Mr. Jerome Murphy, identified by name and date of birth, is a 32 y.o. male with a diagnosis of Diabetes:  .   ASSESSMENT Patient is here today alone. He needs a review of medications, nutrition, and reading labels.  States that his last DKA happened after a death in the family (her aunt), insulin in the garage fridge but this was unplugged. MD note suggests insulin pump - Omnipod 5.  Showed patient this pump and discussed how it works.  Will discuss this and carb counting further on next appointment along with medication and CGM review.  Referral:  Type 1 Diabetes - pump options and training, carb counting  History includes:  Type 1 Diabetes (05/2023), DKA, vitamin D deficiency Medication:  Lantus 5-10 units q am, Humalog 75/25 per sliding scale based on his blood glucose (25-30 units after a meal)  Patient did not bring his medication to this appointment and is taking this incorrectly.  Instructed him on proper insulin and dosing.  He is to d/c the 75/25, take 30 units of Lantus q am, and take 5-10 units of Novolog per meal based on sliding scale per MD note 03/04/2023.   Labs:  A1C 14.5% 02/10/2023 increased from 13.9% 05/18/2022, Insulin Antibodies 46, GAD 1013, C-peptide 0.4, insulin 58 05/18/2022   CGM:  FreeStyle Libre 3 - current sensor reading is 394 CGM Results from download:   % Time CGM active:   82%   (Goal >70%)  Average glucose:   358 mg/dL for 14 days  Glucose management indicator:   11.9 %  Time in range (70-180 mg/dL):   3 %   (Goal >04%)  Time High (181-250 mg/dL):   6 %   (Goal < 54%)  Time Very High (>250 mg/dL):    91 %   (Goal < 5%)  Time Low (54-69 mg/dL):   0 %   (Goal <0%)  Time Very Low (<54 mg/dL):   0 %   (Goal <9%)  %CV (glucose variability)     %  (Goal <36%)   Weight history: 71" 161 lbs 03/29/2023 145  lbs 03/18/23 260 lbs 2022  Patient lives with his sister.  He is currently not working.  His mother just came to visit and food is currently more accessible.  He is not working currently.  Height 5\' 11"  (1.803 m), weight 161 lb (73 kg). Body mass index is 22.45 kg/m.   Diabetes Self-Management Education - 03/29/23 1115       Visit Information   Visit Type First/Initial      Health Coping   How would you rate your overall health? Poor      Psychosocial Assessment   Patient Belief/Attitude about Diabetes Defeat/Burnout    What is the hardest part about your diabetes right now, causing you the most concern, or is the most worrisome to you about your diabetes?   Making healty food and beverage choices;Taking/obtaining medications    Self-care barriers Lack of material resources    Self-management support Doctor's office    Other persons present Patient    Patient Concerns Nutrition/Meal planning    Special Needs None    Preferred Learning Style No preference indicated    Learning Readiness Ready    How often do you need to have someone help you when you read instructions, pamphlets, or other written materials from  your doctor or pharmacy? 1 - Never    What is the last grade level you completed in school? 12      Pre-Education Assessment   Patient understands the diabetes disease and treatment process. Needs Review    Patient understands incorporating nutritional management into lifestyle. Needs Review    Patient undertands incorporating physical activity into lifestyle. Needs Review    Patient understands using medications safely. Needs Review    Patient understands monitoring blood glucose, interpreting and using results Needs Review    Patient understands prevention, detection, and treatment of acute complications. Needs Review    Patient understands prevention, detection, and treatment of chronic complications. Needs Review    Patient understands how to develop strategies to  address psychosocial issues. Needs Review    Patient understands how to develop strategies to promote health/change behavior. Needs Review      Complications   Last HgB A1C per patient/outside source 14.5 %   02/10/2023   How often do you check your blood sugar? > 4 times/day    Have you had a dilated eye exam in the past 12 months? Yes    Have you had a dental exam in the past 12 months? Yes    Are you checking your feet? Yes    How many days per week are you checking your feet? 3      Dietary Intake   Breakfast strawberries, small amount granola OR bacon, grits, eggs, [employer] toast    Lunch none    Snack (afternoon) crackers    Dinner pasta, chicken, shrimp, sausage with alfredo sauce    Snack (evening) NABS - PB cheese crackers (2 packs)    Beverage(s) water, zero drinks      Activity / Exercise   Activity / Exercise Type ADL's    How many days per week do you exercise? 0    How many minutes per day do you exercise? 0    Total minutes per week of exercise 0      Patient Education   Previous Diabetes Education No    Healthy Eating Carbohydrate counting;Plate Method;Meal options for control of blood glucose level and chronic complications.;Food label reading, portion sizes and measuring food.    Medications Reviewed patients medication for diabetes, action, purpose, timing of dose and side effects.;Taught/reviewed insulin/injectables, injection, site rotation, insulin/injectables storage and needle disposal.    Monitoring Taught/evaluated CGM (comment)    Diabetes Stress and Support Identified and addressed patients feelings and concerns about diabetes;Worked with patient to identify barriers to care and solutions      Individualized Goals (developed by patient)   Nutrition Carb counting    Medications take my medication as prescribed    Monitoring  Consistenly use CGM    Problem Solving Eating Pattern;Addressing barriers to behavior change    Reducing Risk treat hypoglycemia with 15  grams of carbs if blood glucose less than 70mg /dL;do foot checks daily;examine blood glucose patterns    Health Coping Ask for help with psychological, social, or emotional issues      Post-Education Assessment   Patient understands the diabetes disease and treatment process. Needs Review    Patient understands incorporating nutritional management into lifestyle. Needs Review    Patient undertands incorporating physical activity into lifestyle. Needs Review    Patient understands using medications safely. Needs Review    Patient understands monitoring blood glucose, interpreting and using results Needs Review    Patient understands prevention, detection, and treatment of acute complications.  Needs Review    Patient understands prevention, detection, and treatment of chronic complications. Needs Review    Patient understands how to develop strategies to address psychosocial issues. Needs Review    Patient understands how to develop strategies to promote health/change behavior. Needs Review      Outcomes   Expected Outcomes Demonstrated interest in learning but significant barriers to change    Future DMSE 2 wks    Program Status Not Completed             Individualized Plan for Diabetes Self-Management Training:   Learning Objective:  Patient will have a greater understanding of diabetes self-management. Patient education plan is to attend individual and/or group sessions per assessed needs and concerns.   Plan:   Patient Instructions  Per MD on 03/04/2023: Stop Humalog mix 75/25   Lantus 30 units daily in the morning.    Novolog take 5-10 units with meals three times day, plus mild sliding scale. Mild Sliding Scale Blood Glucose        Insulin 60-150                     None 151-200                   None 201-250                   2 units 251-300                   4 units 301-350                   6 units 351-400                   8 units      >400                         9 units and call provider   Take 2000 units (50 mcg) Vitamin D3 every day  Begin practicing counting carbohydrates.  I would like to see you first available.  Expected Outcomes:  Demonstrated interest in learning but significant barriers to change  Education material provided: Food label handouts, Meal plan card, and My Plate  If problems or questions, patient to contact team via:  Phone  Future DSME appointment: 2 wks - next available

## 2023-03-30 ENCOUNTER — Ambulatory Visit: Payer: Medicaid Other | Admitting: Critical Care Medicine

## 2023-04-01 ENCOUNTER — Ambulatory Visit: Payer: Medicaid Other | Admitting: Dietician

## 2023-04-05 ENCOUNTER — Ambulatory Visit: Payer: Medicaid Other | Admitting: Nutrition

## 2023-04-08 ENCOUNTER — Other Ambulatory Visit: Payer: Self-pay

## 2023-04-13 ENCOUNTER — Other Ambulatory Visit: Payer: Self-pay

## 2023-04-14 ENCOUNTER — Ambulatory Visit: Payer: Self-pay | Admitting: Nurse Practitioner

## 2023-04-19 ENCOUNTER — Other Ambulatory Visit: Payer: Self-pay

## 2023-04-22 ENCOUNTER — Ambulatory Visit: Payer: Self-pay

## 2023-04-22 NOTE — Telephone Encounter (Signed)
Chief Complaint: Depression, Weight loss uncontrolled blood sugars. Symptoms: Not sleeping, Frustration - see note Frequency: Ongoing Pertinent Negatives: Patient denies  Disposition: [] ED /[] Urgent Care (no appt availability in office) / [] Appointment(In office/virtual)/ []  [facility]/ [] Home Care/ [] Refused Recommended Disposition /[] [facility]/ [x]  Follow-up with PCP Additional Notes: Spoke with mother and pt. Reason for call was depression, pt is requesting a therapist. Pt is very depressed. He sometimes does not get out of bed. Is in unable to keep a job. Pt is requesting help. Pt is type 1 diabetic with uncontrolled BS. Pt states it goes from 45 to 300 or more. He was supposed to get an insulin pump, but he has not received it. Pt has lost considerable weight over the past year, going from a size 38-40 waist to a 30 inch waist. He is unable to keep his blood sugar regulated. He states that his body is so used to extremely high blood sugars, that when he has normal blood sugars he gets the shakes.He states he cannot afford the proper food to maintain bs, and so eats what he has. Pt is very limited in financial resources.  Pt is requesting a referral to a therapist, and needs a Child psychotherapist to help with disability, and financial help. PCP was Dr. Delford Field, so pt does not know who to talk to now. Pt needs a new PCP.  Pt will call EMS/ go to ED if blood sugars are low or high. Sent teams message to Cassandra to see if pt could be worked in.  Please advise.   Reason for Disposition  [1] Blood glucose > 300 mg/dL (82.9 mmol/L) AND [5] two or more times in a row  Answer Assessment - Initial Assessment Questions 1. CONCERN: "What happened that made you call today?"     No. Trying to get to nutritionist, can't work 2. DEPRESSION SYMPTOM SCREENING: "How are you feeling overall?" (e.g., decreased energy, increased sleeping or difficulty sleeping, difficulty concentrating,  feelings of sadness, guilt, hopelessness, or worthlessness)     Not eating, not able to afford proper nutrition 3. RISK OF HARM - SUICIDAL IDEATION:  "Do you ever have thoughts of hurting or killing yourself?"  (e.g., yes, no, no but preoccupation with thoughts about death)   - INTENT:  "Do you have thoughts of hurting or killing yourself right NOW?" (e.g., yes, no, N/A)   - PLAN: "Do you have a specific plan for how you would do this?" (e.g., gun, knife, overdose, no plan, N/A)     no 4. RISK OF HARM - HOMICIDAL IDEATION:  "Do you ever have thoughts of hurting or killing someone else?"  (e.g., yes, no, no but preoccupation with thoughts about death)   - INTENT:  "Do you have thoughts of hurting or killing someone right NOW?" (e.g., yes, no, N/A)   - PLAN: "Do you have a specific plan for how you would do this?" (e.g., gun, knife, no plan, N/A)      no 5. FUNCTIONAL IMPAIRMENT: "How have things been going for you overall? Have you had more difficulty than usual doing your normal daily activities?"  (e.g., better, same, worse; self-care, school, work, interactions)     Not getting out of bed. 6. SUPPORT: "Who is with you now?" "Who do you live with?" "Do you have family or friends who you can talk to?"      Mother 7. THERAPIST: "Do you have a counselor or therapist? Name?"  Wants one 8. STRESSORS: "Has there been any new stress or recent changes in your life?"     No job 9. ALCOHOL USE OR SUBSTANCE USE (DRUG USE): "Do you drink alcohol or use any illegal drugs?"     no  Answer Assessment - Initial Assessment Questions 1. BLOOD GLUCOSE: "What is your blood glucose level?"      PT thinks it is in the 300 2. ONSET: "When did you check the blood glucose?"     Has not 3. USUAL RANGE: "What is your glucose level usually?" (e.g., usual fasting morning value, usual evening value)     45-400  5. TYPE 1 or 2:  "Do you know what type of diabetes you have?"  (e.g., Type 1, Type 2, Gestational;  doesn't know)      Type1 6. INSULIN: "Do you take insulin?" "What type of insulin(s) do you use? What is the mode of delivery? (syringe, pen; injection or pump)?"      yes 8. OTHER SYMPTOMS: "Do you have any symptoms?" (e.g., fever, frequent urination, difficulty breathing, dizziness, weakness, vomiting)     Depression Weight loss  Protocols used: Depression-A-AH, Diabetes - High Blood Sugar-A-AH

## 2023-04-23 NOTE — Telephone Encounter (Signed)
Late entry:  04/22/2023 @1 :26 pm -Call placed to patient unable to reach message left on VM.

## 2023-04-29 ENCOUNTER — Other Ambulatory Visit: Payer: Self-pay

## 2023-04-29 ENCOUNTER — Encounter: Payer: Self-pay | Admitting: Endocrinology

## 2023-04-29 ENCOUNTER — Ambulatory Visit: Payer: Medicaid Other | Admitting: Endocrinology

## 2023-04-29 VITALS — BP 128/64 | HR 97 | Resp 20 | Ht 71.0 in | Wt 148.6 lb

## 2023-04-29 DIAGNOSIS — R809 Proteinuria, unspecified: Secondary | ICD-10-CM | POA: Diagnosis not present

## 2023-04-29 DIAGNOSIS — E1029 Type 1 diabetes mellitus with other diabetic kidney complication: Secondary | ICD-10-CM | POA: Diagnosis not present

## 2023-04-29 LAB — POCT GLYCOSYLATED HEMOGLOBIN (HGB A1C): Hemoglobin A1C: 14.3 % — AB (ref 4.0–5.6)

## 2023-04-29 MED ORDER — OMNIPOD 5 DEXG7G6 PODS GEN 5 MISC
1.0000 | 3 refills | Status: DC
  Filled 2023-04-29: qty 10, 30d supply, fill #0
  Filled 2023-05-27 – 2023-06-15 (×3): qty 10, 30d supply, fill #1

## 2023-04-29 MED ORDER — DEXCOM G6 TRANSMITTER MISC
1.0000 | 3 refills | Status: DC
  Filled 2023-04-29: qty 1, 90d supply, fill #0

## 2023-04-29 MED ORDER — OMNIPOD 5 DEXG7G6 INTRO GEN 5 KIT
1.0000 | PACK | 0 refills | Status: DC | PRN
  Filled 2023-04-29: qty 1, 365d supply, fill #0

## 2023-04-29 MED ORDER — DEXCOM G6 SENSOR MISC
1.0000 | 3 refills | Status: DC
  Filled 2023-04-29: qty 3, 30d supply, fill #0

## 2023-04-29 NOTE — Progress Notes (Signed)
Outpatient Endocrinology Note Jerome Arlen Dupuis, MD  05/01/23  Patient's Name: Jerome Murphy    DOB: May 08, 1991    MRN: [id number]                                                    REASON OF VISIT: Follow-up of type 1 diabetes mellitus  REFERRING PROVIDER: Storm Frisk, MD  PCP: Jerome Frisk, MD  HISTORY OF PRESENT ILLNESS:   Jerome Murphy is a 32 y.o. old male with past medical history listed below, is here for follow-up of type 1 diabetes mellitus.   Pertinent Diabetes History: Patient was diagnosed with diabetes mellitus initially treated as type II in May 2019.  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  He was initially treated with metformin and later insulin therapy was added, compliance had been the issue in the past.  He has uncontrolled type 1 diabetes mellitus hemoglobin A1c in the range of 8 to 14.5%.   Latest Reference Range & Units 05/18/22 20:00  Insulin Antibodies, Human uU/mL 46 (H)  Glutamic Acid Decarb Ab 0.0 - 5.0 U/mL 1,013.1 (H)  C-Peptide 1.1 - 4.4 ng/mL 0.4 (L)  (H): Data is abnormally high (L): Data is abnormally low  Chronic Diabetes Complications : Retinopathy: unknown. Last ophthalmology exam was done on Due Nephropathy: microalbuminuria + Peripheral neuropathy: yes, on gabapentin Coronary artery disease: no Stroke: no  Relevant comorbidities and cardiovascular risk factors: Obesity: no Body mass index is 20.73 kg/m.  Hypertension: no Hyperlipidemia. no  Current / Home Diabetic regimen includes: Lantus 30 units in the morning Novolog 5-10 units with meals when eating plus sliding scale.   Prior diabetic medications: Metformin.  Humalog mix 75/25  Glycemic data:    CONTINUOUS GLUCOSE MONITORING SYSTEM (CGMS) INTERPRETATION: At today's visit, we reviewed  CGM downloads. The full report is scanned in the media. Reviewing the CGM trends, blood glucose are as follows:  FreeStyle Libre 3 CGM-  Sensor Download (Sensor download was reviewed and summarized below.) Dates: September 25 to April 20, 2023 for 14 days  Sensor Average: 367  Glucose Management Indicator: 12.1% Glucose Variability:20.4%  % data captured: 100%  Glycemic Trends:  <54: 0% 54-70: 0% 71-180: 5% 181-250: 2% 251-400: 93%  Interpretation: Almost always hyperglycemia with a blood sugar in the range of 350-400 range, overnight and in the afternoon.  He has occasional trending down blood sugar in the late morning and in the afternoon for few hours with a blood sugar in the acceptable range and rare hypoglycemia with blood sugar in 50-60 range so it happened twice on October 4 and October 8 around 4 PM.  Hyperglycemia pattern seems to be related with not taking basal insulin and trending down blood sugar at times probably related with taking correctional NovoLog at times.  However patient claims compliance with Lantus and NovoLog both.  Hypoglycemia: Patient has no hypoglycemic episodes. Patient has hypoglycemia awareness.  Factors modifying glucose control: 1.  Diabetic diet assessment: three meals a day, started to eat more of vegetable / greens and meats, Gatorade zero. Cut down on potato, starch but still eating breads.   2.  Staying active or exercising: active at  work  3.  Medication compliance: compliant most of the time.  Interval history 04/29/2023 CGM data as reviewed above, mostly hyperglycemia. Concerned about compliance. He claims compliance with lantus and novolog. He complaints about not able to gain weight.   REVIEW OF SYSTEMS As per history of present illness.   PAST MEDICAL HISTORY: Past Medical History:  Diagnosis Date   Asthma    Diabetes mellitus without complication (HCC)    DKA (diabetic ketoacidosis) (HCC) 05/18/2022   History of gallstones     Narcolepsy    Seizures (HCC)     PAST SURGICAL HISTORY: Past Surgical History:  Procedure Laterality Date   CIRCUMCISION     HERNIA REPAIR     TONSILLECTOMY      ALLERGIES: Allergies  Allergen Reactions   Robitussin [Guaifenesin] Hives and Itching    FAMILY HISTORY:  Family History  Problem Relation Age of Onset   Diabetes Mother    Diabetes Maternal Uncle     SOCIAL HISTORY: Social History   Socioeconomic History   Marital status: Single    Spouse name: Not on file   Number of children: 2   Years of education: Not on file   Highest education level: Associate degree: occupational, Scientist, product/process development, or vocational program  Occupational History   Occupation: Barbershop  Tobacco Use   Smoking status: Every Day    Current packs/day: 0.25    Types: Cigarettes   Smokeless tobacco: Never  Vaping Use   Vaping status: Never Used  Substance and Sexual Activity   Alcohol use: No    Comment: occasionallly   Drug use: No   Sexual activity: Yes    Birth control/protection: Condom    Comment: Partner uses Depo  Other Topics Concern   Not on file  Social History Narrative   Not on file   Social Determinants of Health   Financial Resource Strain: Not on file  Food Insecurity: No Food Insecurity (02/10/2023)   Hunger Vital Sign    Worried About Running Out of Food in the Last Year: Never true    Ran Out of Food in the Last Year: Never true  Transportation Needs: No Transportation Needs (02/10/2023)   PRAPARE - Administrator, Civil Service (Medical): No    Lack of Transportation (Non-Medical): No  Physical Activity: Not on file  Stress: Not on file  Social Connections: Not on file    MEDICATIONS:  Current Outpatient Medications  Medication Sig Dispense Refill   albuterol (VENTOLIN HFA) 108 (90 Base) MCG/ACT inhaler Inhale 2 puffs into the lungs every 6 (six) hours as needed for wheezing or shortness of breath. 8 g 2   Continuous Glucose Sensor (DEXCOM G6  SENSOR) MISC Inject 1 Device into the skin continuous for 10 days. 9 each 3   Continuous Glucose Sensor (FREESTYLE LIBRE 3 SENSOR) MISC Place 1 sensor on the skin every 14 days. Use to check glucose continuously 2 each 4   Continuous Glucose Transmitter (DEXCOM G6 TRANSMITTER) MISC 1 Device by Does not apply route continuous. 1 each 3   gabapentin (NEURONTIN) 300 MG capsule Take 1 capsule (300 mg total) by mouth 3 (three) times daily. 90 capsule 3   insulin aspart (NOVOLOG FLEXPEN) 100 UNIT/ML FlexPen Take 5-10 units with meals three times day, plus mild sliding scale. Mild Sliding Scale Blood Glucose        Insulin 60-150  None 151-200                   None 201-250                   2 units 251-300                   4 units 301-350                   6 units 351-400                   8 units      >400                        9 units and call provider 30 mL 3   Insulin Disposable Pump (OMNIPOD 5 G6 INTRO, GEN 5,) KIT 1 each by Does not apply route as needed. 1 kit 0   Insulin Disposable Pump (OMNIPOD 5 G6 PODS, GEN 5,) MISC 1 each by Does not apply route every 3 (three) days. 30 each 3   insulin glargine (LANTUS) 100 UNIT/ML Solostar Pen Inject 30 Units into the skin daily. 30 mL 3   Insulin Lispro Prot & Lispro (HUMALOG 75/25 MIX) (75-25) 100 UNIT/ML Kwikpen Inject 30 Units into the skin 2 (two) times daily. 30 mL 3   Insulin Pen Needle (PEN NEEDLES 3/16") 31G X 5 MM MISC Use 4 times a day 300 each 4   No current facility-administered medications for this visit.    PHYSICAL EXAM: Vitals:   04/29/23 1128  BP: 128/64  Pulse: 97  Resp: 20  SpO2: 99%  Weight: 148 lb 9.6 oz (67.4 kg)  Height: 5\' 11"  (1.803 m)    Body mass index is 20.73 kg/m.  Wt Readings from Last 3 Encounters:  04/29/23 148 lb 9.6 oz (67.4 kg)  03/29/23 161 lb (73 kg)  03/18/23 145 lb (65.8 kg)    General: Well developed, well nourished male in no apparent distress.  HEENT: AT/[place], no external  lesions.  Eyes: Conjunctiva clear and no icterus. Neck: Neck supple  Lungs: Respirations not labored Neurologic: Alert, oriented, normal speech Extremities / Skin: Dry. No sores or rashes noted.  Psychiatric: Does not appear depressed or anxious  Diabetic Foot Exam - Simple   No data filed    LABS Reviewed Lab Results  Component Value Date   HGBA1C 14.3 (A) 04/29/2023   HGBA1C 14.5 (H) 02/10/2023   HGBA1C 13.9 (H) 05/18/2022   No results found for: "FRUCTOSAMINE" Lab Results  Component Value Date   CHOL 132 08/28/2020   HDL 37 (L) 08/28/2020   LDLCALC 82 08/28/2020   TRIG 63 08/28/2020   CHOLHDL 3.6 08/28/2020   Lab Results  Component Value Date   MICRALBCREAT 81 (H) 02/18/2023   MICRALBCREAT 4 11/26/2020   Lab Results  Component Value Date   CREATININE 1.04 03/18/2023   Lab Results  Component Value Date   GFR 110.92 03/04/2023    ASSESSMENT / PLAN  1. 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   Lab Results  Component Value Date   HGBA1C 14.3 (A) 04/29/2023    - Hemoglobin A1c goal : <7%  Discussed about type 1 diabetes mellitus, potential chronic complications and importance of controlling blood sugar.  He is committed for  diabetes care.  He would like to be compliant with insulin therapy and diet.  He is interested in insulin pump /with tubeless OmniPod. Concerned about compliance with multidose insulin regimen. He will benefit from closed loop pump system.   - Medications:  Diabetes regimen:  Continue Lantus 30 units daily in the morning.   Continue Novolog take 5-10 units with meals three times day, plus mild sliding scale. Mild Sliding Scale Blood Glucose        Insulin 60-150                     None 151-200                   None 201-250                   2 units 251-300                   4  units 301-350                   6 units 351-400                   8 units      >400                        9 units and call provider   Start Omnipod 5 with DEXCOM G6, prescription sent, asked to check coverage and cost with pharmacy. Asked to refill and bring Pump and sensor to diabetes educator visit on 10/22, for pump start and training.  Will keep pump setting as follows: Basal rate : 12 AM: 0.7 units/hr 7 AM: 1 units/hr 10AM: 0.7 units/hr  Carb ratio : 1:1 ( small meal carb count 2, medium meal carb count 4 and large meals carb count 6). He does not know carb counting.  Correction factor: 1:50  Active insulin time 4 hours.    - Home glucose testing: Freestyle libre 3/check as needed. - Discussed/ Gave Hypoglycemia treatment plan.  # Consult : not required at this time.   # Annual urine for microalbuminuria/ creatinine ratio, no microalbuminuria currently. Last  Lab Results  Component Value Date   MICRALBCREAT 81 (H) 02/18/2023    # Foot check nightly / neuropathy, continue gabapentin.  # Annual dilated diabetic eye exams, will refer to ophthalmology in the follow-up visit.  - Diet: Make healthy diabetic food choices - Life style / activity / exercise: Discussed.  2. Blood pressure  -  BP Readings from Last 1 Encounters:  04/29/23 128/64    - Control is in target.  - No change in current plans.  3. 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 -     Insulin Disposable Pump (OMNIPOD 5 G6 PODS, GEN 5,) MISC; 1 each by Does not apply route every 3 (three) days. -     Continuous Glucose Transmitter (DEXCOM G6 TRANSMITTER) MISC; 1 Device by Does  not apply route continuous. -     Continuous Glucose Sensor (DEXCOM G6 SENSOR)  MISC; Inject 1 Device into the skin continuous for 10 days.     DISPOSITION Follow up in clinic in 6 weeks suggested.   All questions answered and patient verbalized understanding of the plan.  Jerome Angelyse Heslin, MD [facility] [address] [place], [address] [place], [place] [address] Phone # [phone]  At least part of this note was generated using voice recognition software. Inadvertent word errors may have occurred, which were not recognized during the proofreading process.

## 2023-04-29 NOTE — Patient Instructions (Signed)
Sent prescription form Omnipod and DEXCOM G6 to pharmacy , pick up and bring those at the visit with [facility] for pump starting and training.

## 2023-04-30 ENCOUNTER — Other Ambulatory Visit: Payer: Self-pay

## 2023-05-03 ENCOUNTER — Other Ambulatory Visit: Payer: Self-pay

## 2023-05-03 ENCOUNTER — Encounter: Payer: Self-pay | Admitting: Endocrinology

## 2023-05-04 ENCOUNTER — Other Ambulatory Visit: Payer: Self-pay

## 2023-05-04 ENCOUNTER — Telehealth: Payer: Self-pay | Admitting: Nutrition

## 2023-05-04 ENCOUNTER — Encounter: Payer: Medicaid Other | Attending: Critical Care Medicine | Admitting: Nutrition

## 2023-05-04 DIAGNOSIS — E1065 Type 1 diabetes mellitus with hyperglycemia: Secondary | ICD-10-CM | POA: Insufficient documentation

## 2023-05-04 MED ORDER — INSULIN ASPART 100 UNIT/ML IJ SOLN
INTRAMUSCULAR | 11 refills | Status: DC
  Filled 2023-05-04 – 2023-05-05 (×2): qty 20, 33d supply, fill #0
  Filled 2023-06-02: qty 20, 33d supply, fill #1
  Filled 2023-07-09 – 2023-07-26 (×2): qty 20, 33d supply, fill #2
  Filled 2023-08-23: qty 20, 33d supply, fill #3
  Filled 2023-09-15 (×2): qty 20, 33d supply, fill #4

## 2023-05-04 NOTE — Telephone Encounter (Signed)
Sent. Thanks.   

## 2023-05-04 NOTE — Patient Instructions (Signed)
Change pod every 3 days Change dexcom on your phone and PDM every 14 days Bolus for all meals and snacks using units of insulin and current blood sugar readings. Call OmniPod help line if questions or problems with pump.

## 2023-05-04 NOTE — Progress Notes (Signed)
Pt. Was trained on the use of the OmniPod 5 insulin pump.  Settings were put in per Dr. Wadie Lessen office note on 04/29/23:  Basal rate: MN: 0.7u/hr,7 AM: 1 units/hr10AM: 0.7 units/hr, I/C: 1, ISF: 50, target: 125 with correction over 150.  Max bolus 30u, max basal rate: 1.4u/hr.  He filled a pod with Novolog insulin via the pens, and attached this to his left abdomen. His PDM was linked to [facility]ooko and to [employer] [facility].  User name: [email or web address],  PW: [phone]. He was trained on the use of the Dexcom G7.  His pods are able to use this.  The sensor was applied to his left upper, outer arm.  The readings were the phone app, and to clarity. He was trained on how to fill a pod, give a bolus and do correction boluses for all blood sugar readings over 250.  He will return in one week for review.  He signed the pump training checklist as understanding and had no final questions.

## 2023-05-05 ENCOUNTER — Other Ambulatory Visit: Payer: Self-pay

## 2023-05-05 ENCOUNTER — Other Ambulatory Visit: Payer: Self-pay | Admitting: Endocrinology

## 2023-05-05 MED ORDER — INSULIN SYRINGE 31G X 5/16" 1 ML MISC
1.0000 | Freq: Every day | 3 refills | Status: DC
Start: 2023-05-05 — End: 2023-11-25
  Filled 2023-05-05: qty 100, 34d supply, fill #0
  Filled 2023-06-02: qty 100, 34d supply, fill #1
  Filled 2023-07-09 – 2023-07-26 (×2): qty 100, 34d supply, fill #2
  Filled 2023-10-22: qty 100, 34d supply, fill #3

## 2023-05-07 ENCOUNTER — Other Ambulatory Visit: Payer: Self-pay

## 2023-05-08 ENCOUNTER — Ambulatory Visit: Payer: Medicaid Other | Attending: Family | Admitting: Primary Care

## 2023-05-08 VITALS — BP 135/83 | HR 71 | Wt 158.0 lb

## 2023-05-08 DIAGNOSIS — Z794 Long term (current) use of insulin: Secondary | ICD-10-CM

## 2023-05-08 DIAGNOSIS — E1142 Type 2 diabetes mellitus with diabetic polyneuropathy: Secondary | ICD-10-CM

## 2023-05-11 ENCOUNTER — Other Ambulatory Visit: Payer: Self-pay

## 2023-05-13 ENCOUNTER — Telehealth: Payer: Self-pay

## 2023-05-13 NOTE — Telephone Encounter (Signed)
Please advise 

## 2023-05-14 ENCOUNTER — Telehealth: Payer: Self-pay | Admitting: Licensed Clinical Social Worker

## 2023-05-14 NOTE — Telephone Encounter (Signed)
LCSWA called patient today to introduce herself and to assess patients' mental health needs. Patient did not answer the phone. LCSWA was able to leave a brief message with the patient asking them to return the call. Patient was referred by PCP for life stressors.

## 2023-05-19 ENCOUNTER — Telehealth (INDEPENDENT_AMBULATORY_CARE_PROVIDER_SITE_OTHER): Payer: Self-pay | Admitting: Licensed Clinical Social Worker

## 2023-05-19 NOTE — Telephone Encounter (Signed)
Called patient again patient did not answer.  A message was left with patient asking to call back at [facility].

## 2023-05-19 NOTE — Telephone Encounter (Signed)
noted 

## 2023-05-19 NOTE — Telephone Encounter (Signed)
Hey we call him. Last week. We plan to cal him again today.

## 2023-05-23 NOTE — Progress Notes (Signed)
[facility]  Jerome Murphy, is a 32 y.o. male  ACZ:[id number]  FUX:[id number]  DOB - March 10, 1991  Chief Complaint  Patient presents with   Medical Management of Chronic Issues       Subjective:   Jerome Murphy is a 32 y.o. male here today for a follow up visit.  For the management of type 2 diabetes.  The DEXA,com had been beeping and beeping.  To the point the patient remove this device from his arm.  Advised patient to go to urgent care or the emergency room to replace the device, and this will be the only way to track his blood sugars. Denies polyuria, polydipsia, polyphasia or vision changes.  Does not check blood sugars at home.  Patient has No headache, No chest pain, No abdominal pain - No Nausea, No new weakness tingling or numbness, No Cough - shortness of breath  No problems updated.  Allergies  Allergen Reactions   Robitussin [Guaifenesin] Hives and Itching    Past Medical History:  Diagnosis Date   Asthma    Diabetes mellitus without complication (HCC)    DKA (diabetic ketoacidosis) (HCC) 05/18/2022   History of gallstones    Narcolepsy    Seizures (HCC)     Current Outpatient Medications on File Prior to Visit  Medication Sig Dispense Refill   albuterol (VENTOLIN HFA) 108 (90 Base) MCG/ACT inhaler Inhale 2 puffs into the lungs every 6 (six) hours as needed for wheezing or shortness of breath. 8 g 2   Continuous Glucose Sensor (DEXCOM G6 SENSOR) MISC Inject 1 Device into the skin continuous for 10 days. 9 each 3   Continuous Glucose Sensor (FREESTYLE LIBRE 3 SENSOR) MISC Place 1 sensor on the skin every 14 days. Use to check glucose continuously 2 each 4   Continuous Glucose Transmitter (DEXCOM G6 TRANSMITTER) MISC 1 Device by Does not apply route continuous. 1 each 3   gabapentin (NEURONTIN) 300 MG capsule Take 1 capsule (300 mg total) by mouth 3 (three) times daily. 90 capsule 3   insulin aspart (NOVOLOG FLEXPEN) 100 UNIT/ML FlexPen  Take 5-10 units with meals three times day, plus mild sliding scale. Mild Sliding Scale Blood Glucose        Insulin 60-150                     None 151-200                   None 201-250                   2 units 251-300                   4 units 301-350                   6 units 351-400                   8 units      >400                        9 units and call provider 30 mL 3   insulin aspart (NOVOLOG) 100 UNIT/ML injection Use via insulin pump 60 units/day. 20 mL 11   Insulin Disposable Pump (OMNIPOD 5 G6 INTRO, GEN 5,) KIT 1 each by Does not apply route as needed. 1 kit 0   Insulin Disposable Pump (OMNIPOD 5 G6 PODS, GEN 5,)  MISC 1 each by Does not apply route every 3 (three) days. 30 each 3   insulin glargine (LANTUS) 100 UNIT/ML Solostar Pen Inject 30 Units into the skin daily. 30 mL 3   Insulin Lispro Prot & Lispro (HUMALOG 75/25 MIX) (75-25) 100 UNIT/ML Kwikpen Inject 30 Units into the skin 2 (two) times daily. 30 mL 3   Insulin Pen Needle (PEN NEEDLES 3/16") 31G X 5 MM MISC Use 4 times a day 300 each 4   Insulin Syringe-Needle U-100 (INSULIN SYRINGE 1CC/31GX5/16") 31G X 5/16" 1 ML MISC 1 Device by Does not apply route daily. 100 each 3   No current facility-administered medications on file prior to visit.    Objective:   Vitals:   05/08/23 1051  BP: 135/83  Pulse: 71  SpO2: 100%  Weight: 158 lb (71.7 kg)    Comprehensive ROS Pertinent positive and negative noted in HPI   Exam General appearance : Awake, alert, not in any distress. Speech Clear. Not toxic looking HEENT: Atraumatic and Normocephalic, pupils equally reactive to light and accomodation Neck: Supple, no JVD. No cervical lymphadenopathy.  Chest: Good air entry bilaterally, no added sounds  CVS: S1 S2 regular, no murmurs.  Abdomen: Bowel sounds present, Non tender and not distended with no gaurding, rigidity or rebound. Extremities: B/L Lower Ext shows no edema, both legs are warm to touch Neurology: Awake  alert, and oriented X 3, CN II-XII intact, Non focal Skin: No Rash  Data Review Lab Results  Component Value Date   HGBA1C 14.3 (A) 04/29/2023   HGBA1C 14.5 (H) 02/10/2023   HGBA1C 13.9 (H) 05/18/2022    Assessment & Plan  Jerome Murphy was seen today for medical management of chronic issues.  Diagnoses and all orders for this visit:  Type 2 diabetes mellitus with diabetic polyneuropathy, with long-term current use of insulin (HCC) Last A1c uncontrolled 14.3.  Complications from uncontrolled diabetes -diabetic retinopathy leading to blindness, diabetic nephropathy leading to dialysis, decrease in circulation decrease in sores or wound healing which may lead to amputations and increase of heart attack and stroke .  Advised to seek treatment at urgent care or ED to place device back in his own to monitor his blood sugars.    Patient have been counseled extensively about nutrition and exercise. Other issues discussed during this visit include: low cholesterol diet, weight control and daily exercise, foot care, annual eye examinations at Ophthalmology, importance of adherence with medications and regular follow-up. We also discussed long term complications of uncontrolled diabetes and hypertension.   No follow-ups on file.  The patient was given clear instructions to go to ER or return to medical center if symptoms don't improve, worsen or new problems develop. The patient verbalized understanding. The patient was told to call to get lab results if they haven't heard anything in the next week.   This note has been created with Education officer, environmental. Any transcriptional errors are unintentional.   Grayce Sessions, NP 05/23/2023, 9:29 PM

## 2023-05-24 ENCOUNTER — Other Ambulatory Visit: Payer: Self-pay

## 2023-05-25 ENCOUNTER — Other Ambulatory Visit: Payer: Self-pay

## 2023-05-26 ENCOUNTER — Telehealth: Payer: Self-pay | Admitting: Nutrition

## 2023-05-26 NOTE — Telephone Encounter (Signed)
LVM to call me to schedule follow-up training visit.

## 2023-05-27 ENCOUNTER — Other Ambulatory Visit: Payer: Self-pay

## 2023-05-27 ENCOUNTER — Ambulatory Visit: Payer: Medicaid Other | Admitting: Endocrinology

## 2023-05-27 ENCOUNTER — Other Ambulatory Visit: Payer: Self-pay | Admitting: Endocrinology

## 2023-05-27 ENCOUNTER — Encounter: Payer: Self-pay | Admitting: Endocrinology

## 2023-05-27 VITALS — BP 100/80 | HR 102 | Resp 20 | Ht 71.0 in | Wt 148.8 lb

## 2023-05-27 DIAGNOSIS — R809 Proteinuria, unspecified: Secondary | ICD-10-CM | POA: Diagnosis not present

## 2023-05-27 DIAGNOSIS — E1029 Type 1 diabetes mellitus with other diabetic kidney complication: Secondary | ICD-10-CM | POA: Diagnosis not present

## 2023-05-27 LAB — POCT GLUCOSE (DEVICE FOR HOME USE): POC Glucose: 435 mg/dL — AB (ref 70–99)

## 2023-05-27 MED ORDER — DEXCOM G7 SENSOR MISC
1.0000 | 3 refills | Status: DC
  Filled 2023-05-27: qty 3, 30d supply, fill #0
  Filled 2023-07-02 – 2023-07-26 (×2): qty 3, 30d supply, fill #1
  Filled 2023-08-23: qty 3, 30d supply, fill #2
  Filled 2023-10-14: qty 3, 30d supply, fill #3
  Filled 2023-11-23: qty 3, 30d supply, fill #4
  Filled 2023-12-22 – 2024-03-10 (×6): qty 3, 30d supply, fill #5
  Filled 2024-04-12: qty 3, 30d supply, fill #6
  Filled 2024-05-22: qty 3, 30d supply, fill #7

## 2023-05-27 MED ORDER — DEXCOM G6 TRANSMITTER MISC
1.0000 | 3 refills | Status: DC
  Filled 2023-05-27: qty 1, fill #0

## 2023-05-27 MED ORDER — DEXCOM G6 SENSOR MISC
1.0000 | 3 refills | Status: DC
Start: 2023-05-27 — End: 2023-05-27
  Filled 2023-05-27: qty 9, fill #0

## 2023-05-27 NOTE — Progress Notes (Signed)
Outpatient Endocrinology Note [place] Aniko Finnigan, MD  05/27/23  Patient's Name: Jerome Murphy    DOB: [birth year]    MRN: [id number]                                                    REASON OF VISIT: Follow-up of type 1 diabetes mellitus  REFERRING PROVIDER: Storm Frisk, MD  PCP: Storm Frisk, MD  HISTORY OF PRESENT ILLNESS:   Jerome Murphy is a 32 y.o. old male with past medical history listed below, is here for follow-up of type 1 diabetes mellitus.   Pertinent Diabetes History: Patient was diagnosed with diabetes mellitus initially treated as type II in May 2019.  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  He was initially treated with metformin and later insulin therapy was added, compliance had been the issue in the past.  He has uncontrolled type 1 diabetes mellitus hemoglobin A1c in the range of 8 to 14.5%.    Latest Reference Range & Units 05/18/22 20:00  Insulin Antibodies, Human uU/mL 46 (H)  Glutamic Acid Decarb Ab 0.0 - 5.0 U/mL 1,013.1 (H)  C-Peptide 1.1 - 4.4 ng/mL 0.4 (L)  (H): Data is abnormally high (L): Data is abnormally low  Insulin pump OmniPod 5 was started in end of October 2024.   Chronic Diabetes Complications : Retinopathy: unknown. Last ophthalmology exam was done on Due Nephropathy: microalbuminuria + Peripheral neuropathy: yes, on gabapentin Coronary artery disease: no Stroke: no  Relevant comorbidities and cardiovascular risk factors: Obesity: no Body mass index is 20.75 kg/m.  Hypertension: no Hyperlipidemia. no  Current / Home Diabetic regimen includes:  OmniPod 5 with DeXCOM G7, using Novolog U100.  Pump setting: Basal rate: 12 AM - 0.7 units/hr 7.0AM - 1.0 10 AM -  0.7  Correction factor : 1:50  Carb ratio : 1:1 ? ( small meal carb count 2,  medium meal carb count 4 and large meals carb count 6). He does not know carb counting.  Target 130-160  Active insulin time : 4 hr  Prior diabetic medications: Basal bolus regimen: Lantus 30 units in the morning. Novolog 5-10 units with meals when eating plus sliding scale.  Metformin.  Humalog mix 75/25  Glycemic data:    CONTINUOUS GLUCOSE MONITORING SYSTEM (CGMS) INTERPRETATION:                    Dexcom G7 CGM-  Sensor Download (Sensor download was reviewed and summarized below.) Dates: October 29 to May 24, 2023  Glucose Management Indicator: n/a% Sensor Average: 304 SD 90 Sensor usage:71 %  Glycemic Trends:  <54: <1% 54-70: 0% 71-180: 12% 181-250: 16% 251-400: 72%  Impression: -Mostly hyperglycemia with a blood sugar in the range of 350-400.  There has been occasional trending down blood sugar seems to be related with correctional bolus insulin.  No significant hypoglycemia.    Hypoglycemia: Patient has no hypoglycemic episodes. Patient has hypoglycemia awareness.  Factors modifying glucose control: 1.  Diabetic diet assessment: three meals a day, started to eat more of vegetable / greens and meats, Gatorade zero. Cut down on potato, starch but still eating breads.   2.  Staying  active or exercising: active at work  3.  Medication compliance: compliant most of the time.  Interval history  Patient was started on OmniPod 5 around the end of October.  He was able to use pump and had training with diabetes educator with Dexcom G7.  Recently he got Dexcom G6 from the pharmacy and not able to use the pump and presented today in the clinic for the follow-up.  He has not been using pump for few days.  He has been doing correctional NovoLog insulin for hyperglycemia.  Denies any nausea, vomiting or abdominal pain.  Provided Dexcom G7 sensor 3 samples in the clinic today.  He was able to start pump and sensor in the clinic today.  Dexcom G7 data as reviewed above.   Mostly hyperglycemia.  Pump setting reviewed.  No other complaints today.  He has blood sugar 435 in the clinic today, advised to drink water and he started insulin pump in the clinic.  REVIEW OF SYSTEMS As per history of present illness.   PAST MEDICAL HISTORY: Past Medical History:  Diagnosis Date   Asthma    Diabetes mellitus without complication (HCC)    DKA (diabetic ketoacidosis) (HCC) 05/18/2022   History of gallstones    Narcolepsy    Seizures (HCC)     PAST SURGICAL HISTORY: Past Surgical History:  Procedure Laterality Date   CIRCUMCISION     HERNIA REPAIR     TONSILLECTOMY      ALLERGIES: Allergies  Allergen Reactions   Robitussin [Guaifenesin] Hives and Itching    FAMILY HISTORY:  Family History  Problem Relation Age of Onset   Diabetes Mother    Diabetes Maternal Uncle     SOCIAL HISTORY: Social History   Socioeconomic History   Marital status: Single    Spouse name: Not on file   Number of children: 2   Years of education: Not on file   Highest education level: Associate degree: occupational, Scientist, product/process development, or vocational program  Occupational History   Occupation: Barbershop  Tobacco Use   Smoking status: Every Day    Current packs/day: 0.25    Types: Cigarettes   Smokeless tobacco: Never  Vaping Use   Vaping status: Never Used  Substance and Sexual Activity   Alcohol use: No    Comment: occasionallly   Drug use: No   Sexual activity: Yes    Birth control/protection: Condom    Comment: Partner uses Depo  Other Topics Concern   Not on file  Social History Narrative   Not on file   Social Determinants of Health   Financial Resource Strain: High Risk (05/08/2023)   Overall Financial Resource Strain (CARDIA)    Difficulty of Paying Living Expenses: Hard  Food Insecurity: Food Insecurity Present (05/08/2023)   Hunger Vital Sign    Worried About Running Out of Food in the Last Year: Often true    Ran Out of Food in the Last Year: Often  true  Transportation Needs: Unmet Transportation Needs (05/08/2023)   PRAPARE - Administrator, Civil Service (Medical): Yes    Lack of Transportation (Non-Medical): Yes  Physical Activity: Inactive (05/08/2023)   Exercise Vital Sign    Days of Exercise per Week: 0 days    Minutes of Exercise per Session: 0 min  Stress: Stress Concern Present (05/08/2023)   [employer] of Occupational Health - Occupational Stress Questionnaire    Feeling of Stress : Rather much  Social Connections: Moderately Isolated (05/08/2023)  Social Advertising account executive [NHANES]    Frequency of Communication with Friends and Family: Twice a week    Frequency of Social Gatherings with Friends and Family: Twice a week    Attends Religious Services: More than 4 times per year    Active Member of [employer] or Organizations: No    Attends Engineer, structural: Not on file    Marital Status: Never married    MEDICATIONS:  Current Outpatient Medications  Medication Sig Dispense Refill   albuterol (VENTOLIN HFA) 108 (90 Base) MCG/ACT inhaler Inhale 2 puffs into the lungs every 6 (six) hours as needed for wheezing or shortness of breath. 8 g 2   Continuous Glucose Sensor (DEXCOM G7 SENSOR) MISC 1 Device by Does not apply route continuous. 9 each 3   gabapentin (NEURONTIN) 300 MG capsule Take 1 capsule (300 mg total) by mouth 3 (three) times daily. 90 capsule 3   insulin aspart (NOVOLOG) 100 UNIT/ML injection Use via insulin pump 60 units/day. 20 mL 11   Insulin Disposable Pump (OMNIPOD 5 G6 INTRO, GEN 5,) KIT 1 each by Does not apply route as needed. 1 kit 0   Insulin Disposable Pump (OMNIPOD 5 G6 PODS, GEN 5,) MISC 1 each by Does not apply route every 3 (three) days. 30 each 3   Insulin Pen Needle (PEN NEEDLES 3/16") 31G X 5 MM MISC Use 4 times a day 300 each 4   Insulin Syringe-Needle U-100 (INSULIN SYRINGE 1CC/31GX5/16") 31G X 5/16" 1 ML MISC 1 Device by Does not apply route daily. 100  each 3   No current facility-administered medications for this visit.    PHYSICAL EXAM: Vitals:   05/27/23 1049  BP: 100/80  Pulse: (!) 102  Resp: 20  SpO2: 99%  Weight: 148 lb 12.8 oz (67.5 kg)  Height: 5\' 11"  (1.803 m)     Body mass index is 20.75 kg/m.  Wt Readings from Last 3 Encounters:  05/27/23 148 lb 12.8 oz (67.5 kg)  05/08/23 158 lb (71.7 kg)  04/29/23 148 lb 9.6 oz (67.4 kg)    General: Well developed, well nourished male in no apparent distress.  HEENT: AT/[place], no external lesions.  Eyes: Conjunctiva clear and no icterus. Neck: Neck supple  Lungs: Respirations not labored Neurologic: Alert, oriented, normal speech Extremities / Skin: Dry.  Psychiatric: Does not appear depressed or anxious  Diabetic Foot Exam - Simple   No data filed    LABS Reviewed Lab Results  Component Value Date   HGBA1C 14.3 (A) 04/29/2023   HGBA1C 14.5 (H) 02/10/2023   HGBA1C 13.9 (H) 05/18/2022   No results found for: "FRUCTOSAMINE" Lab Results  Component Value Date   CHOL 132 08/28/2020   HDL 37 (L) 08/28/2020   LDLCALC 82 08/28/2020   TRIG 63 08/28/2020   CHOLHDL 3.6 08/28/2020   Lab Results  Component Value Date   MICRALBCREAT 81 (H) 02/18/2023   MICRALBCREAT 4 11/26/2020   Lab Results  Component Value Date   CREATININE 1.04 03/18/2023   Lab Results  Component Value Date   GFR 110.92 03/04/2023    ASSESSMENT / PLAN  1. 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   Lab Results  Component Value Date   HGBA1C 14.3 (A) 04/29/2023    - Hemoglobin A1c goal : <7%  Discussed about type 1 diabetes mellitus, potential  chronic complications and importance of controlling blood sugar.  He is committed for diabetes care.  He would like to be compliant with insulin therapy and diet.   Started OmniPod 5 in October  2024.  - Medications:   Continue current pump settings today.  OmniPod 5 with DeXCOM G7, using Novolog U100.  Pump setting: Basal rate: 12 AM - 0.7 units/hr 7.0AM - 1.0 10 AM -  0.7  Correction factor : 1:50  Carb ratio : 1:1 ? ( small meal carb count 2, medium meal carb count 4 and large meals carb count 6). He does not know carb counting.  Target 130-160  Active insulin time : 4 hr  Patient reports his mother is helping with entering carbs for meal boluses.  He is not sure how he is doing.  Was advised as ( small meal carb count 2, medium meal carb count 4 and large meals carb count 6). He does not know carb counting.  - Home glucose testing: Dexcom G7 and check as needed.  Sent the prescription. - Discussed/ Gave Hypoglycemia treatment plan.  # Consult : not required at this time.   # Annual urine for microalbuminuria/ creatinine ratio. Last  Lab Results  Component Value Date   MICRALBCREAT 81 (H) 02/18/2023    # Foot check nightly / neuropathy, continue gabapentin.  # Annual dilated diabetic eye exams, will refer to ophthalmology in the follow-up visit.  - Diet: Make healthy diabetic food choices - Life style / activity / exercise: Discussed.  2. Blood pressure  -  BP Readings from Last 1 Encounters:  05/27/23 100/80    - Control is in target.  - No change in current plans.  3. 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    DISPOSITION Follow up in clinic in 4 weeks suggested.  Also follow-up with diabetic educator for pump training and updates.   All questions answered and patient verbalized understanding of the plan.  [place]  Illana Nolting, MD [facility] [address] [place], [address] [place], [place] [address] Phone # [phone]  At least part of this note was generated using voice recognition software. Inadvertent word errors may have occurred, which were not recognized during the proofreading process.

## 2023-05-27 NOTE — Telephone Encounter (Signed)
Returned patient call.   He is having problems getting the Dexcom and POD to link.  Instructed him to call the [facility] 24/7 tech support line. Patient wishes to have a follow up training session with Bonita Quin re:  how the pump works.  Appointment made. Called his pharmacy.  They have sent a prior auth for the Dexcom G7 to the [place] office. N They have Dexcom G6/G7 pods at that pharmacy that have been paid for.  Oran Rein, RD, LDN, CDCES

## 2023-05-28 ENCOUNTER — Encounter: Payer: Self-pay | Admitting: Endocrinology

## 2023-05-28 ENCOUNTER — Other Ambulatory Visit: Payer: Self-pay

## 2023-05-31 ENCOUNTER — Other Ambulatory Visit: Payer: Self-pay

## 2023-05-31 ENCOUNTER — Telehealth: Payer: Self-pay

## 2023-05-31 ENCOUNTER — Other Ambulatory Visit (HOSPITAL_COMMUNITY): Payer: Self-pay

## 2023-05-31 NOTE — Telephone Encounter (Signed)
Pharmacy Patient Advocate Encounter   Received notification from CoverMyMeds that prior authorization for Dexcom G7 sensor is required/requested.   Insurance verification completed.   The patient is insured through [facility] .   Per test claim: PA required; PA submitted to above mentioned insurance via CoverMyMeds Key/confirmation #/EOC [id number] Status is pending

## 2023-06-01 ENCOUNTER — Other Ambulatory Visit: Payer: Self-pay

## 2023-06-02 ENCOUNTER — Other Ambulatory Visit: Payer: Self-pay

## 2023-06-02 ENCOUNTER — Encounter: Payer: Medicaid Other | Attending: Critical Care Medicine | Admitting: Nutrition

## 2023-06-02 DIAGNOSIS — E1065 Type 1 diabetes mellitus with hyperglycemia: Secondary | ICD-10-CM | POA: Insufficient documentation

## 2023-06-02 NOTE — Telephone Encounter (Signed)
PA decision given to patient for [facility] G7 Sensors

## 2023-06-02 NOTE — Telephone Encounter (Signed)
Pharmacy Patient Advocate Encounter  Received notification from [facility] that Prior Authorization for Dexcom G7 Sensor has been APPROVED from 05-31-2023 to 11-27-2023   PA #/Case ID/Reference #: [id number]

## 2023-06-02 NOTE — Progress Notes (Signed)
Patient wanted assistance with starting back on his OmniPOd 5 pump.  He is currently not wearing his pod, but is wearing the Dexcom G7 sensor.  Blood sugar is now 147mg /dl.    He has been giving Novolog insulin via a syringe for both basal and bolus needs every 5-6 hours.  Gave 30u at 7AM today.  It is now 11:30AM and there will be no IOB.   Reviewed all topics on the pump training checklist and he filled a pod and applied this to his right arm.  We reviewed proper placement of the pod with reference to his sensor for better communication of blood sugar readings to the PDM.   We also reviewed IOB and what this means.  He reported good understanding and had no final questions.

## 2023-06-04 ENCOUNTER — Telehealth: Payer: Self-pay | Admitting: Critical Care Medicine

## 2023-06-04 NOTE — Telephone Encounter (Signed)
Copied from CRM [id number]. Topic: Referral - Status >> Jun 04, 2023  9:52 AM Macon Large wrote: Reason for CRM: Pt stated that he was referred to someone named Toni Amend for depression but he has not heard back from anyone regarding an appt. Cb# [phone]

## 2023-06-09 ENCOUNTER — Other Ambulatory Visit: Payer: Self-pay

## 2023-06-09 ENCOUNTER — Telehealth: Payer: Self-pay | Admitting: Licensed Clinical Social Worker

## 2023-06-09 NOTE — Telephone Encounter (Signed)
Received and finally spoke with pt

## 2023-06-09 NOTE — Telephone Encounter (Signed)
Contact the patient and introduced myself and assessed his mental health needs.  Patient shared and sounded very depressed.  Patient shared that he is concerned about his recent weight loss his sugar levels and a few other health concerns.  Patient stated that he was referred to therapist in regards to his symptoms of depression.  Patient reports no suicidal thoughts or thoughts of harming others.  Patient has an appointment with therapist for December 2.

## 2023-06-10 NOTE — Patient Instructions (Signed)
Change pod every 3 days Call OmniPod helpline if questions about your pump functions. Call Dexcom if questions about your CGM communicating to your pdm.

## 2023-06-14 ENCOUNTER — Ambulatory Visit: Payer: Medicaid Other | Attending: Licensed Clinical Social Worker | Admitting: Licensed Clinical Social Worker

## 2023-06-14 DIAGNOSIS — F4321 Adjustment disorder with depressed mood: Secondary | ICD-10-CM

## 2023-06-15 ENCOUNTER — Encounter: Payer: Self-pay | Admitting: Internal Medicine

## 2023-06-15 ENCOUNTER — Ambulatory Visit: Payer: Medicaid Other | Attending: Internal Medicine | Admitting: Internal Medicine

## 2023-06-15 ENCOUNTER — Other Ambulatory Visit: Payer: Self-pay

## 2023-06-15 VITALS — BP 118/75 | HR 114 | Temp 98.3°F | Ht 71.0 in | Wt 156.0 lb

## 2023-06-15 DIAGNOSIS — E1042 Type 1 diabetes mellitus with diabetic polyneuropathy: Secondary | ICD-10-CM | POA: Diagnosis not present

## 2023-06-15 DIAGNOSIS — Z1159 Encounter for screening for other viral diseases: Secondary | ICD-10-CM

## 2023-06-15 DIAGNOSIS — F321 Major depressive disorder, single episode, moderate: Secondary | ICD-10-CM

## 2023-06-15 DIAGNOSIS — Z2821 Immunization not carried out because of patient refusal: Secondary | ICD-10-CM

## 2023-06-15 DIAGNOSIS — F411 Generalized anxiety disorder: Secondary | ICD-10-CM | POA: Diagnosis not present

## 2023-06-15 DIAGNOSIS — R634 Abnormal weight loss: Secondary | ICD-10-CM

## 2023-06-15 DIAGNOSIS — S65311A Laceration of deep palmar arch of right hand, initial encounter: Secondary | ICD-10-CM

## 2023-06-15 MED ORDER — SERTRALINE HCL 50 MG PO TABS
ORAL_TABLET | ORAL | 1 refills | Status: DC
Start: 2023-06-15 — End: 2023-06-30
  Filled 2023-06-15: qty 30, 40d supply, fill #0

## 2023-06-15 NOTE — Progress Notes (Unsigned)
Patient ID: Jerome Murphy, male    DOB: 1991-01-04  MRN: [id number]  CC: Establish Care (Est care with new PCP. /Reports weakness, fatigue, nausea X1 day/)   Subjective: Jerome Murphy is a 32 y.o. male who presents for chronic ds management.  PCP was Dr. Delford Field who has since retired. His concerns today include:  Patient with history of DM type I , tobacco dependence, HL, asthma,  Discussed the use of AI scribe software for clinical note transcription with the patient, who gave verbal consent to proceed.  History of Present Illness   The patient, with a history of type 1 diabetes and neuropathy, presents with concerns about his health management. He reports experiencing numbness in his fingers and toes, which he describes as a tingling sensation similar to frostbite. This symptom started in 2022 and has been managed with gabapentin 300mg  three times a day. The patient reports that the medication helps with the spasms but has not restored feeling to his fingers and toes.  The patient also reports significant weight loss, dropping from a size 44 to a size 30 within a year. He attributes this weight loss to his struggle with diabetes management and expresses frustration with his changing body. He reports that his appetite is fine, but he has to be selective about what and when he eats due to his diabetes.  In addition to his physical health concerns, the patient is also dealing with depression and anxiety. He expresses feelings of fear and worry about his health, particularly about his blood sugar levels dropping too low. He reports staying up at night due to this fear. He also expresses feelings of social isolation and stigma, avoiding family functions and interactions due to comments about his weight loss and health.       Patient Active Problem List   Diagnosis Date Noted   Type 1 diabetes mellitus without complication (HCC) 03/04/2023   Neuropathy 02/18/2023   Seizure  disorder (HCC) 02/10/2023   Uncontrolled type 1 diabetes mellitus with hyperglycemia (HCC) 07/09/2022   Asthma 07/09/2022   Nausea vomiting and diarrhea 07/09/2022   Vitamin D deficiency 11/26/2020   Mixed hyperlipidemia 10/24/2019   Tobacco dependence 08/29/2019     Current Outpatient Medications on File Prior to Visit  Medication Sig Dispense Refill   albuterol (VENTOLIN HFA) 108 (90 Base) MCG/ACT inhaler Inhale 2 puffs into the lungs every 6 (six) hours as needed for wheezing or shortness of breath. 8 g 2   Continuous Glucose Sensor (DEXCOM G7 SENSOR) MISC use as directed for continuous glucose monitoring 9 each 3   gabapentin (NEURONTIN) 300 MG capsule Take 1 capsule (300 mg total) by mouth 3 (three) times daily. 90 capsule 3   insulin aspart (NOVOLOG) 100 UNIT/ML injection Use via insulin pump 60 units/day. 20 mL 11   Insulin Disposable Pump (OMNIPOD 5 DEXG7G6 PODS GEN 5) MISC use as directed change every 3 (three) days. 30 each 3   Insulin Disposable Pump (OMNIPOD 5 G6 INTRO, GEN 5,) KIT 1 each by Does not apply route as needed. 1 kit 0   Insulin Pen Needle (PEN NEEDLES 3/16") 31G X 5 MM MISC Use 4 times a day 300 each 4   Insulin Syringe-Needle U-100 (INSULIN SYRINGE 1CC/31GX5/16") 31G X 5/16" 1 ML MISC 1 Device by Does not apply route daily. 100 each 3   No current facility-administered medications on file prior to visit.    Allergies  Allergen Reactions   Robitussin [Guaifenesin] Hives  and Itching    Social History   Socioeconomic History   Marital status: Single    Spouse name: Not on file   Number of children: 2   Years of education: Not on file   Highest education level: Associate degree: occupational, Scientist, product/process development, or vocational program  Occupational History   Occupation: Barbershop  Tobacco Use   Smoking status: Every Day    Current packs/day: 0.25    Types: Cigarettes   Smokeless tobacco: Never  Vaping Use   Vaping status: Never Used  Substance and Sexual  Activity   Alcohol use: No    Comment: occasionallly   Drug use: No   Sexual activity: Yes    Birth control/protection: Condom    Comment: Partner uses Depo  Other Topics Concern   Not on file  Social History Narrative   Not on file   Social Determinants of Health   Financial Resource Strain: High Risk (05/08/2023)   Overall Financial Resource Strain (CARDIA)    Difficulty of Paying Living Expenses: Hard  Food Insecurity: Food Insecurity Present (05/08/2023)   Hunger Vital Sign    Worried About Running Out of Food in the Last Year: Often true    Ran Out of Food in the Last Year: Often true  Transportation Needs: Unmet Transportation Needs (05/08/2023)   PRAPARE - Administrator, Civil Service (Medical): Yes    Lack of Transportation (Non-Medical): Yes  Physical Activity: Inactive (05/08/2023)   Exercise Vital Sign    Days of Exercise per Week: 0 days    Minutes of Exercise per Session: 0 min  Stress: Stress Concern Present (05/08/2023)   [employer] of Occupational Health - Occupational Stress Questionnaire    Feeling of Stress : Rather much  Social Connections: Moderately Isolated (05/08/2023)   Social Connection and Isolation Panel [NHANES]    Frequency of Communication with Friends and Family: Twice a week    Frequency of Social Gatherings with Friends and Family: Twice a week    Attends Religious Services: More than 4 times per year    Active Member of [employer] or Organizations: No    Attends Engineer, structural: Not on file    Marital Status: Never married  Intimate Partner Violence: Not At Risk (02/10/2023)   Humiliation, Afraid, Rape, and Kick questionnaire    Fear of Current or Ex-Partner: No    Emotionally Abused: No    Physically Abused: No    Sexually Abused: No    Family History  Problem Relation Age of Onset   Diabetes Mother    Diabetes Maternal Uncle     Past Surgical History:  Procedure Laterality Date   CIRCUMCISION      HERNIA REPAIR     TONSILLECTOMY      ROS: Review of Systems Negative except as stated above  PHYSICAL EXAM: BP 118/75 (BP Location: Left Arm, Patient Position: Sitting, Cuff Size: Normal)   Pulse (!) 114   Temp 98.3 F (36.8 C) (Oral)   Ht 5\' 11"  (1.803 m)   Wt 156 lb (70.8 kg)   SpO2 98%   BMI 21.76 kg/m   Wt Readings from Last 3 Encounters:  06/15/23 156 lb (70.8 kg)  05/27/23 148 lb 12.8 oz (67.5 kg)  05/08/23 158 lb (71.7 kg)    Physical Exam  {male adult master:310786} {male adult master:310785}    06/15/2023   11:20 AM 05/08/2023   10:53 AM 03/29/2023   10:49 AM  Depression screen PHQ  2/9  Decreased Interest 2 2 0  Down, Depressed, Hopeless 2 2 0  PHQ - 2 Score 4 4 0  Altered sleeping 1 1   Tired, decreased energy 1 2   Change in appetite 1 1   Feeling bad or failure about yourself  2 1   Trouble concentrating 0 0   Moving slowly or fidgety/restless 1 0   Suicidal thoughts 0 0   PHQ-9 Score 10 9   Difficult doing work/chores Very difficult        06/15/2023   11:20 AM 05/08/2023   10:53 AM 02/18/2023   10:40 AM 11/25/2020   11:31 AM  GAD 7 : Generalized Anxiety Score  Nervous, Anxious, on Edge 1 1 0 0  Control/stop worrying 1 2 0 0  Worry too much - different things 2 1 0 0  Trouble relaxing 1 1 0 0  Restless 0 0 0 0  Easily annoyed or irritable 1 1 1  0  Afraid - awful might happen 2 0 0 0  Total GAD 7 Score 8 6 1  0  Anxiety Difficulty Very difficult  Not difficult at all         Latest Ref Rng & Units 03/18/2023   10:07 PM 03/04/2023    3:28 PM 02/11/2023    7:12 AM  CMP  Glucose 70 - 99 mg/dL 161  096  045   BUN 6 - 20 mg/dL 12  12  11    Creatinine 0.61 - 1.24 mg/dL 4.09  8.11  9.14   Sodium 135 - 145 mmol/L 132  135  137   Potassium 3.5 - 5.1 mmol/L 4.2  4.0  3.3   Chloride 98 - 111 mmol/L 99  100  109   CO2 22 - 32 mmol/L 18  27  17    Calcium 8.9 - 10.3 mg/dL 9.3  9.2  8.1   Total Protein 6.5 - 8.1 g/dL 8.7     Total Bilirubin 0.3 -  1.2 mg/dL 1.3     Alkaline Phos 38 - 126 U/L 92     AST 15 - 41 U/L 12     ALT 0 - 44 U/L 17      Lipid Panel     Component Value Date/Time   CHOL 132 08/28/2020 1036   TRIG 63 08/28/2020 1036   HDL 37 (L) 08/28/2020 1036   CHOLHDL 3.6 08/28/2020 1036   CHOLHDL 4.3 08/29/2019 1036   LDLCALC 82 08/28/2020 1036   LDLCALC 107 (H) 08/29/2019 1036    CBC    Component Value Date/Time   WBC 5.7 03/18/2023 2207   RBC 5.56 03/18/2023 2207   HGB 18.3 (H) 03/18/2023 2207   HCT 52.5 (H) 03/18/2023 2207   PLT 260 03/18/2023 2207   MCV 94.4 03/18/2023 2207   MCH 32.9 03/18/2023 2207   MCHC 34.9 03/18/2023 2207   RDW 12.1 03/18/2023 2207   LYMPHSABS 1.7 02/11/2023 1334   MONOABS 0.5 02/11/2023 1334   EOSABS 0.2 02/11/2023 1334   BASOSABS 0.0 02/11/2023 1334    ASSESSMENT AND PLAN:  Assessment and Plan              There are no diagnoses linked to this encounter.   Patient was given the opportunity to ask questions.  Patient verbalized understanding of the plan and was able to repeat key elements of the plan.   This documentation was completed using Paediatric nurse.  Any transcriptional errors are unintentional.  No orders of the defined types were placed in this encounter.    Requested Prescriptions    No prescriptions requested or ordered in this encounter    No follow-ups on file.  Jonah Blue, MD, FACP

## 2023-06-15 NOTE — Telephone Encounter (Signed)
Noted  

## 2023-06-15 NOTE — Patient Instructions (Signed)
We have started you on a medication called Zoloft to help with depression and anxiety.  If you have any worsening depression or thoughts of self-harm, please give [place] a call.  I have referred you to a psychiatrist and for some counseling.

## 2023-06-16 ENCOUNTER — Encounter: Payer: Self-pay | Admitting: Internal Medicine

## 2023-06-16 DIAGNOSIS — F321 Major depressive disorder, single episode, moderate: Secondary | ICD-10-CM | POA: Insufficient documentation

## 2023-06-16 DIAGNOSIS — F411 Generalized anxiety disorder: Secondary | ICD-10-CM | POA: Insufficient documentation

## 2023-06-16 DIAGNOSIS — F4321 Adjustment disorder with depressed mood: Secondary | ICD-10-CM | POA: Insufficient documentation

## 2023-06-16 NOTE — BH Specialist Note (Signed)
Integrated Behavioral Health Initial In-Person Visit  MRN: [id number] Name: Jerome Murphy  Number of Integrated Behavioral Health Clinician visits: 1- Initial Visit  Session Start time: [phone]    Session End time: 0945  Total time in minutes: 40   Types of Service: Individual psychotherapy and Introduction only  Interpretor:No. Interpretor Name and Language: n/a   Warm Hand Off Completed.    Subjective: Jerome Murphy is a 32 y.o. male accompanied by  himself Patient was referred by PCP and community clinic mobile bus for depression. Patient reports the following symptoms/concerns: Extremely anxious and nervous often depressed and isolated Duration of problem: past year; Severity of problem: severe  Objective: Mood: Depressed and Irritable and Affect: Depressed Risk of harm to self or others: No plan to harm self or others  Life Context: Family and Social: Patient shared that he lives with his girlfriend and mother School/Work: Did not mention his work status. Self-Care: None reported Life Changes: Patient shared that he recently was diagnosed with diabetes and is often worried about his level.  He has since lost over 80 pounds.  Patient and/or Family's Strengths/Protective Factors: Willingness to seek help  Goals Addressed: Patient will: Reduce symptoms of: agitation, anxiety, and depression Increase knowledge and/or ability of: coping skills and healthy habits  Demonstrate ability to: Increase healthy adjustment to current life circumstances and Increase adequate support systems for patient/family  Progress towards Goals: Ongoing  Interventions: Interventions utilized: Motivational Interviewing and Supportive Counseling  Standardized Assessments completed: GAD-7 and PHQ 9  Patient and/or Family Response: Patient was very irritable and depressed when coming in today patient shared he is extremely isolated and his family is worried that he is  using drugs.  Patient states that this really upsets him because that is not the case that he has lost significant amount of weight.  Patient does not like the way that he looks he is very insecure in his work.  Patient is struggling and could benefit from healthy eating habits and how to manage his sugar levels because he often worries that he will die if he does not stay up and monitor his levels  Patient Centered Plan: Patient is on the following Treatment Plan(s):  Depression  Assessment: Patient currently experiencing insomnia anxiousness but does worry in extreme agitation symptoms of depression rapid weight loss.   Patient may benefit from ongoing support from the clinic family support and education.  Plan: Follow up with behavioral health clinician on : 4 weeks Behavioral recommendations: ongoing support from the clinic family support and education. Referral(s): Integrated [employer] (In Clinic) and Psychiatrist "From scale of 1-10, how likely are you to follow plan?": not sure  Vassie Loll, LCSWA

## 2023-06-17 LAB — TSH+T4F+T3FREE
Free T4: 1.24 ng/dL (ref 0.82–1.77)
T3, Free: 2.4 pg/mL (ref 2.0–4.4)
TSH: 0.973 u[IU]/mL (ref 0.450–4.500)

## 2023-06-17 LAB — HEPATITIS C ANTIBODY: Hep C Virus Ab: NONREACTIVE

## 2023-06-17 LAB — HIV ANTIBODY (ROUTINE TESTING W REFLEX): HIV Screen 4th Generation wRfx: NONREACTIVE

## 2023-06-24 ENCOUNTER — Ambulatory Visit: Payer: Medicaid Other | Admitting: Endocrinology

## 2023-06-29 NOTE — Progress Notes (Unsigned)
Psychiatric Initial Adult Assessment  Patient Identification: Jerome Murphy MRN:  [id number] Date of Evaluation:  06/30/2023 Referral Source: Jonah Blue MD  Assessment:  Jerome Murphy is a 32 y.o. male with no formal past psychiatric history and medical history of T1DM with neuropathy, asthma, possible seizures in childhood, and Vitamin D deficiency who presents to [facility] via video conferencing for initial evaluation of mood and anxiety.  Patient carries history of severely uncontrolled T1DM with significant weight loss over the past few years both of which have impacted sense of identity, self-worth, and belonging in peer groups. He reports that due to sudden drop in weight, he has frequently been accused by prior supports of abusing substances which has led him to avoid social outings. He additionally reports that fatigue related to hyperglycemia interferes with him engaging with family as desired. While he endorses low energy, motivation, appetite and sleep disturbance, and periods of hopelessness these symptoms are greatly confounded by uncontrolled T1DM and notably desire to be engaging with family/friends remains intact. He reports considerable anxiety surrounding health and fear of hypoglycemic episodes leading to development of panic attacks and difficulty falling asleep at night. At this time, would conceptualize symptoms as adjustment reaction and anxiety disorder due to medical illness however will continue to monitor for development of MDD. Will plan to further evaluate at next visit for possible PTSD as major medical events can precipitate trauma reaction.  He was recently started on Zoloft a few weeks ago by PCP; however given comorbid neuropathy patient was amenable to switching from Zoloft to Cymbalta at this time. Additionally, will make available Atarax PRN for panic attacks and sleep. Counseled on timeline to effect of both of these  medications and importance of adherence to Cymbalta to obtain maximal effect.  RTC in 2 months by video (next available appt). Encouraged patient to reach out earlier if any questions/concerns in the interim.   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 Past medication trials: none Status of problem: new problem to this provider Interventions: -- SWITCH from Zoloft to Cymbalta at this time  -- STOP Zoloft 50 mg daily  -- START Cymbalta 30 mg daily for 1 week then INCREASE to 60 mg daily -- Risks, benefits, and side effects including but not limited to HA, sleep disturbance, sexual side effects were reviewed with informed consent provided -- Renal function including GFR wnl 03/18/23 -- START Atarax 25 mg BID PRN panic attacks -- Prescribed gabapentin 300 mg TID by PCP -- Scheduled for initial individual psychotherapy with Richardson Dopp LCSW 08/25/23 -- Patient would likely benefit from meeting with nutritionist; per PCP note 06/15/23 patient reported already being connected with nutritionist and will check in on status of this at next appointment  Patient was given contact information for behavioral health clinic and was instructed to call 911 for emergencies.   Subjective:  Chief Complaint:  Chief Complaint  Patient presents with   Medication Management    History of Present Illness:    Chart review: -- Referred by PCP Dec 2024 for MDD, GAD. Started on Zoloft 25 mg daily on 06/15/23 and instructed to increase to 50mg  after 3 weeks.  -- Home psych meds: Zoloft 25 mg daily Gabapentin 300 mg TID    Patient reports mood changes and significant anxiety related to poor control of T1DM and feels that he wakes up "facing a constant battle." Endorses significant weight  loss (> 100 lbs in 1 year) and family members/friends have accused him of using drugs. Reports avoiding family because of these comments made against him.  Was initially treated as T2DM but recently found to be T1DM. Reports weight loss is related to DKA; last hospitalization was in August. Estimates hospitalizations about 3 times per year.  Reports appetite is fair but identifies frustration that food that is available in the home is not healthy and conducive to BS control. Reports he doesn't recognize himself anymore due to weight loss.  States that due to avoidance and inability to participate in certain activities due to fatigue, he has experienced associated decline in mood. Reports avoiding family funerals (aunt passed away summer 2024 and uncle shortly before this), birthday parties. Unable to play catch with his son due to fatigue. Endorses low energy and motivation. Denies anhedonia and states "I would have loved to be there." Reports feeling more hopeless lately. States he is constantly thinking about dying but denies desire for death and states "I want to become old Early."  Endorses constant worrying about health and fear of future with associated disruption of sleep and trouble concentrating. Reports difficulty falling asleep due to fear his BS will fall with constant fear that he won't wake up. Does have a monitor that alarms but afraid he won't hear the alert.  Reports experiencing panic attacks in which he experiences significant anxiety, feeling of doom, heart racing, lightheadedness. BS is normal during these episodes. Lasts about 5 minutes; deep breathing helps. Occurring multiple times a day.   Identifies mom and girlfriend as supports however overall feels isolated and alone. Lives with girlfriend, his kids, sister and sister's kids. Has trouble reaching out to supports because he doesn't want the conversation to "always be about [place]." Used to find it helpful to talk to his barber but men at the barbershop started talking about suspicions that he was using substances so he has since "faded away."  Endorses significant neuropathy in  fingers and toes. Will be seeing a podiatrist today. Has found gabapentin somewhat helpful for preventing further worsening but still present and bothersome.   He has been taking Zoloft 50 mg daily for the past 2 weeks and has not noticed a difference thus far. Discussed timeline to effect. Reviewed options for medications to help with depression/anxiety including potential of SNRI to additionally help with neuropathic symptoms. He is amenable to stopping Zoloft in favor of Cymbalta at this time. All questions/concerns addressed.  Past Psychiatric History:  Diagnoses: no formal psychiatric diagnoses Medication trials: denies Previous psychiatrist/therapist: denies Hospitalizations: denies Suicide attempts: denies SIB: denies Hx of violence towards others: denies Current access to guns: yes Hx of trauma/abuse: requires further evaluation  Previous Psychotropic Medications: No   Substance Abuse History in the last 12 months:  No.  -- Etoh: denies  -- Tobacco: 0.5-1 ppd  -- Denies use of illicit drugs including cannabis, CBD/THC, stimulants, BZDs, hallucinogens  Past Medical History:  Past Medical History:  Diagnosis Date   Asthma    Diabetes mellitus type I (HCC)    Diabetes mellitus without complication (HCC)    DKA (diabetic ketoacidosis) (HCC) 05/18/2022   History of gallstones    Narcolepsy    Seizures (HCC)     Past Surgical History:  Procedure Laterality Date   CIRCUMCISION     HERNIA REPAIR     TONSILLECTOMY      Family Psychiatric History:  Mom: suicide attempt Dad: depression following MI  Family  History:  Family History  Problem Relation Age of Onset   Diabetes Mother    Suicidality Mother    Depression Father    Diabetes Maternal Uncle     Social History:   Academic/Vocational: 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  Social History   Socioeconomic History   Marital status: Single    Spouse name: Not on file   Number of children: 2   Years of  education: Not on file   Highest education level: Associate degree: occupational, Scientist, product/process development, or vocational program  Occupational History   Occupation: Barbershop  Tobacco Use   Smoking status: Every Day    Current packs/day: 0.50    Types: Cigarettes   Smokeless tobacco: Never  Vaping Use   Vaping status: Never Used  Substance and Sexual Activity   Alcohol use: No    Comment: occasionallly   Drug use: No   Sexual activity: Yes    Birth control/protection: Condom    Comment: Partner uses Depo  Other Topics Concern   Not on file  Social History Narrative   Not on file   Social Drivers of Health   Financial Resource Strain: Low Risk  (06/18/2023)   [facility] from [facility]   Overall Financial Resource Strain (CARDIA)    Difficulty of Paying Living Expenses: Not hard at all  Recent Concern: Financial Resource Strain - High Risk (05/08/2023)   Overall Financial Resource Strain (CARDIA)    Difficulty of Paying Living Expenses: Hard  Food Insecurity: No Food Insecurity (06/18/2023)   Received from [facility]   Hunger Vital Sign    Worried About Running Out of Food in the Last Year: Never true    Ran Out of Food in the Last Year: Never true  Recent Concern: Food Insecurity - Food Insecurity Present (05/08/2023)   Hunger Vital Sign    Worried About Running Out of Food in the Last Year: Often true    Ran Out of Food in the Last Year: Often true  Transportation Needs: No Transportation Needs (06/18/2023)   Received from [facility]   PRAPARE - Transportation    In the past 12 months, has lack of transportation kept you from medical appointments or from getting medications?: No    Lack of Transportation (Non-Medical): No  Recent Concern: Transportation Needs - Unmet Transportation Needs (05/08/2023)   PRAPARE - Transportation    Lack of Transportation (Medical): Yes    Lack of Transportation (Non-Medical): Yes  Physical  Activity: Inactive (05/08/2023)   Exercise Vital Sign    Days of Exercise per Week: 0 days    Minutes of Exercise per Session: 0 min  Stress: Stress Concern Present (05/08/2023)   [employer] of Occupational Health - Occupational Stress Questionnaire    Feeling of Stress : Rather much  Social Connections: Moderately Isolated (05/08/2023)   Social Connection and Isolation Panel [NHANES]    Frequency of Communication with Friends and Family: Twice a week    Frequency of Social Gatherings with Friends and Family: Twice a week    Attends Religious Services: More than 4 times per year    Active Member of [employer] or Organizations: No    Attends Engineer, structural: Not on file    Marital Status: Never married    Additional Social History: updated  Allergies:   Allergies  Allergen Reactions   Robitussin [Guaifenesin] Hives and Itching    Current Medications: Current Outpatient Medications  Medication  Sig Dispense Refill   albuterol (VENTOLIN HFA) 108 (90 Base) MCG/ACT inhaler Inhale 2 puffs into the lungs every 6 (six) hours as needed for wheezing or shortness of breath. 8 g 2   DULoxetine (CYMBALTA) 30 MG capsule Take 1 capsule (30 mg total) daily for 1 week then increase to 2 capsules (60 mg total) daily 60 capsule 2   gabapentin (NEURONTIN) 300 MG capsule Take 1 capsule (300 mg total) by mouth 3 (three) times daily. 90 capsule 3   hydrOXYzine (ATARAX) 25 MG tablet Take 1 tablet (25 mg total) by mouth 2 (two) times daily as needed (panic attacks). 60 tablet 2   Continuous Glucose Sensor (DEXCOM G7 SENSOR) MISC use as directed for continuous glucose monitoring 9 each 3   insulin aspart (NOVOLOG) 100 UNIT/ML injection Use via insulin pump 60 units/day. 20 mL 11   Insulin Disposable Pump (OMNIPOD 5 DEXG7G6 PODS GEN 5) MISC use as directed change every 3 (three) days. 30 each 3   Insulin Disposable Pump (OMNIPOD 5 G6 INTRO, GEN 5,) KIT 1 each by Does not apply route as needed.  1 kit 0   Insulin Pen Needle (PEN NEEDLES 3/16") 31G X 5 MM MISC Use 4 times a day 300 each 4   Insulin Syringe-Needle U-100 (INSULIN SYRINGE 1CC/31GX5/16") 31G X 5/16" 1 ML MISC 1 Device by Does not apply route daily. 100 each 3   No current facility-administered medications for this visit.    ROS: See above  Objective:  Psychiatric Specialty Exam: There were no vitals taken for this visit.There is no height or weight on file to calculate BMI.  General Appearance: Casual and Well Groomed  Eye Contact:  Good  Speech:  Clear and Coherent and Normal Rate  Volume:  Normal  Mood:   "not like myself"  Affect:   Dysthymic; slightly anxious  Thought Content:  Denies AVH; no overt delusional thought content on interview    Suicidal Thoughts:  No  Homicidal Thoughts:  No  Thought Process:  Goal Directed and Linear  Orientation:  Full (Time, Place, and Person)    Memory: Grossly intact   Judgment:  Good  Insight:  Good  Concentration:  Concentration: Good  Recall:  not formally assessed  Fund of Knowledge: Good  Language: Good  Psychomotor Activity:  Normal  Akathisia:  NA  AIMS (if indicated): NA  Assets:  Communication Skills Desire for Improvement Housing Intimacy Resilience Transportation  ADL's:  Intact  Cognition: WNL  Sleep:  Poor   PE: General: sits comfortably in view of camera; no acute distress  Pulm: no increased work of breathing on room air  MSK: all extremity movements appear intact  Neuro: no focal neurological deficits observed  Gait & Station: unable to assess by video    Metabolic Disorder Labs: Lab Results  Component Value Date   HGBA1C 14.3 (A) 04/29/2023   MPG 369 02/10/2023   MPG 352.23 05/18/2022   No results found for: "PROLACTIN" Lab Results  Component Value Date   CHOL 132 08/28/2020   TRIG 63 08/28/2020   HDL 37 (L) 08/28/2020   CHOLHDL 3.6 08/28/2020   LDLCALC 82 08/28/2020   LDLCALC 107 (H) 08/29/2019   Lab Results  Component  Value Date   TSH 0.973 06/15/2023    Therapeutic Level Labs: No results found for: "LITHIUM" No results found for: "CBMZ" No results found for: "VALPROATE"  Screenings:  GAD-7    Flowsheet Row Office Visit from 06/15/2023 in [place]  [facility] [place] - A Dept Of [place]. [facility] Office Visit from 05/08/2023 in [facility] [place] - A Dept Of [place]. [facility] Office Visit from 02/18/2023 in [facility] - A Dept Of El[place]a Bridegroom. [facility] Office Visit from 11/25/2020 in [facility] 1  Total GAD-7 Score 8 6 1  0      PHQ2-9    Flowsheet Row Office Visit from 06/15/2023 in [facility] [place] - A Dept Of [place]. [facility]al Health from 06/14/2023 in [facility] [place] - A Dept Of [place]a Bridegroom. [facility] Office Visit from 05/08/2023 in [facility] [place] - A Dept Of [place]. [facility] Nutrition from 03/29/2023 in [facility] Diab Ed  - A Dept Of . [facility] Office Visit from 02/18/2023 in [facility] - A Dept Of [place]a Bridegroom. [facility]  PHQ-2 Total Score 4 4 4  0 1  PHQ-9 Total Score 10 10 9  -- 4      Flowsheet Row ED from 03/18/2023 in [facility] Emergency Department at [facility] ED to Hosp-Admission (Discharged) from 02/10/2023 in [facility] ICU[facility]CCU ED to Hosp-Admission (Discharged) from 07/09/2022 in [facility] GENERAL SURGERY  C-SSRS RISK CATEGORY No Risk No Risk No Risk       Collaboration of Care: Collaboration of Care: Medication Management AEB active medication management, Psychiatrist AEB established with this provider, and Referral or follow-up with counselor/therapist AEB scheduled for individual psychotherapy  Patient/Guardian was advised Release of Information must be obtained prior to any record release in order  to collaborate their care with an outside provider. Patient/Guardian was advised if they have not already done so to contact the registration department to sign all necessary forms in order for [place] to release information regarding their care.   Consent: Patient/Guardian gives verbal consent for treatment and assignment of benefits for services provided during this visit. Patient/Guardian expressed understanding and agreed to proceed.   Televisit via video: I connected with [name] on 06/30/23 at  9:00 AM EST by a video enabled telemedicine application and verified that I am speaking with the correct person using two identifiers.  Location: Patient: parked car outside podiatrist's office Provider: remote office in [place]   I discussed the limitations of evaluation and management by telemedicine and the availability of in person appointments. The patient expressed understanding and agreed to proceed.  I discussed the assessment and treatment plan with the patient. The patient was provided an opportunity to ask questions and all were answered. The patient agreed with the plan and demonstrated an understanding of the instructions.   The patient was advised to call back or seek an in-person evaluation if the symptoms worsen or if the condition fails to improve as anticipated.  I provided 80 minutes dedicated to the care of this patient via video on the date of this encounter to include chart review, face-to-face time with the patient, medication management/counseling, brief supportive and cognitive psychotherapy.  Kyon Bentler A Lujain Kraszewski 12/18/202412:35 PM

## 2023-06-30 ENCOUNTER — Ambulatory Visit (HOSPITAL_COMMUNITY): Payer: Medicaid Other | Admitting: Psychiatry

## 2023-06-30 ENCOUNTER — Other Ambulatory Visit: Payer: Self-pay

## 2023-06-30 ENCOUNTER — Encounter (HOSPITAL_COMMUNITY): Payer: Self-pay | Admitting: Psychiatry

## 2023-06-30 DIAGNOSIS — F410 Panic disorder [episodic paroxysmal anxiety] without agoraphobia: Secondary | ICD-10-CM | POA: Diagnosis not present

## 2023-06-30 DIAGNOSIS — F4321 Adjustment disorder with depressed mood: Secondary | ICD-10-CM | POA: Diagnosis not present

## 2023-06-30 DIAGNOSIS — E109 Type 1 diabetes mellitus without complications: Secondary | ICD-10-CM | POA: Diagnosis not present

## 2023-06-30 DIAGNOSIS — F064 Anxiety disorder due to known physiological condition: Secondary | ICD-10-CM | POA: Diagnosis not present

## 2023-06-30 MED ORDER — DULOXETINE HCL 30 MG PO CPEP
ORAL_CAPSULE | ORAL | 2 refills | Status: DC
  Filled 2023-06-30: qty 60, 30d supply, fill #0
  Filled 2023-07-26: qty 60, 30d supply, fill #1
  Filled 2023-08-23: qty 60, 30d supply, fill #2

## 2023-06-30 MED ORDER — HYDROXYZINE HCL 25 MG PO TABS
25.0000 mg | ORAL_TABLET | Freq: Two times a day (BID) | ORAL | 2 refills | Status: DC | PRN
  Filled 2023-06-30: qty 60, 30d supply, fill #0
  Filled 2023-07-26: qty 60, 30d supply, fill #1

## 2023-06-30 NOTE — Patient Instructions (Signed)
Thank you for attending your appointment today.  -- STOP Zoloft -- START Cymbalta 30 mg daily for 1 week then INCREASE to 60 mg daily -- START hydroxyzine 25 mg up to twice daily as needed for panic attacks -- Continue other medications as prescribed.  Please do not make any changes to medications without first discussing with your provider. If you are experiencing a psychiatric emergency, please call 911 or present to your nearest emergency department. Additional crisis, medication management, and therapy resources are included below.  [facility]  [address], [place], [place] [address] [phone] WALK-IN URGENT CARE 24/7 FOR ANYONE [address], [place], [place]  [phone] Fax: [phone] [email or web address] *Interpreters available *Accepts all insurance and uninsured for Urgent Care needs *Accepts Medicaid and uninsured for outpatient treatment (below)      ONLY FOR [facility]  Below:    Outpatient New Patient Assessment/Therapy Walk-ins:        Monday, Wednesday, and Thursday 8am until slots are full (first come, first served)                   New Patient Psychiatry/Medication Management        Monday-Friday 8am-11am (first come, first served)               For all walk-ins we ask that you arrive by 7:15am, because patients will be seen in the order of arrival.

## 2023-07-02 ENCOUNTER — Other Ambulatory Visit (HOSPITAL_COMMUNITY): Payer: Self-pay

## 2023-07-02 ENCOUNTER — Other Ambulatory Visit: Payer: Self-pay

## 2023-07-02 ENCOUNTER — Other Ambulatory Visit: Payer: Self-pay | Admitting: Endocrinology

## 2023-07-02 MED ORDER — OMNIPOD 5 DEXG7G6 INTRO GEN 5 KIT
1.0000 | PACK | 0 refills | Status: DC | PRN
  Filled 2023-07-02 – 2023-07-26 (×2): qty 1, 365d supply, fill #0

## 2023-07-09 ENCOUNTER — Other Ambulatory Visit: Payer: Self-pay

## 2023-07-13 ENCOUNTER — Other Ambulatory Visit: Payer: Self-pay

## 2023-07-21 ENCOUNTER — Other Ambulatory Visit: Payer: Self-pay

## 2023-07-26 ENCOUNTER — Other Ambulatory Visit: Payer: Self-pay

## 2023-08-17 ENCOUNTER — Ambulatory Visit: Payer: Medicaid Other | Attending: Internal Medicine | Admitting: Internal Medicine

## 2023-08-17 DIAGNOSIS — E109 Type 1 diabetes mellitus without complications: Secondary | ICD-10-CM | POA: Diagnosis not present

## 2023-08-17 DIAGNOSIS — R5383 Other fatigue: Secondary | ICD-10-CM

## 2023-08-17 DIAGNOSIS — F411 Generalized anxiety disorder: Secondary | ICD-10-CM | POA: Diagnosis not present

## 2023-08-17 DIAGNOSIS — E1042 Type 1 diabetes mellitus with diabetic polyneuropathy: Secondary | ICD-10-CM

## 2023-08-17 DIAGNOSIS — F321 Major depressive disorder, single episode, moderate: Secondary | ICD-10-CM

## 2023-08-17 NOTE — Progress Notes (Signed)
 Patient ID: Jerome Murphy, male   DOB: Dec 27, 1990, 33 y.o.   MRN: [id number] Virtual Visit via Video Note  I connected with Jerome Murphy on 08/17/2023 at 10:39 AM by a video enabled telemedicine application and verified that I am speaking with the correct person using two identifiers.  Location: Patient: home Provider: Office   I discussed the limitations of evaluation and management by telemedicine and the availability of in person appointments. The patient expressed understanding and agreed to proceed.  History of Present Illness: Patient with history of DM type I , tobacco dependence, HL, asthma, MDD/GAD  Since last visit, he has seen psychiatry.  Zoloft  changed to Hydroxyzine  and Cymbalta  30 mg BID.  Pt states he has not noticed a significant change in mood so far.  Has f/u appt in 1 wk.   C/o low energy and feels drained a lot. Has been going on for a while; not every day but most days for past few wks.  Purchased some OTC B12 supplement. Gets in 5-6 hrs sleep at nights; wakes up out of nervousness about BS dropping low.  Never told that he snores or snores loud when he sleeps.  Recent thyroid  levels normal. Wants blood test done to eval his fatigue  Missed last appt with endocrinologist Dr. Mercie on 06/24/23.  Pt reports that when he eats healthy, BS tend to drop more.  Outpatient Encounter Medications as of 08/17/2023  Medication Sig   albuterol  (VENTOLIN  HFA) 108 (90 Base) MCG/ACT inhaler Inhale 2 puffs into the lungs every 6 (six) hours as needed for wheezing or shortness of breath.   Continuous Glucose Sensor (DEXCOM G7 SENSOR) MISC use as directed for continuous glucose monitoring   DULoxetine  (CYMBALTA ) 30 MG capsule Take 1 capsule (30 mg total) daily for 1 week then increase to 2 capsules (60 mg total) daily   gabapentin  (NEURONTIN ) 300 MG capsule Take 1 capsule (300 mg total) by mouth 3 (three) times daily.   hydrOXYzine  (ATARAX ) 25 MG tablet Take 1 tablet  (25 mg total) by mouth 2 (two) times daily as needed (panic attacks).   insulin  aspart (NOVOLOG ) 100 UNIT/ML injection Use via insulin  pump 60 units/day.   Insulin  Disposable Pump (OMNIPOD 5 DEXG7G6 INTRO GEN 5) KIT 1 each by Does not apply route as needed.   Insulin  Disposable Pump (OMNIPOD 5 DEXG7G6 PODS GEN 5) MISC use as directed change every 3 (three) days.   Insulin  Pen Needle (PEN NEEDLES 3/16) 31G X 5 MM MISC Use 4 times a day   Insulin  Syringe-Needle U-100 (INSULIN  SYRINGE 1CC/31GX5/16) 31G X 5/16 1 ML MISC 1 Device by Does not apply route daily.   No facility-administered encounter medications on file as of 08/17/2023.      Observations/Objective: Young AAM in NAD  Lab Results  Component Value Date   WBC 5.7 03/18/2023   HGB 18.3 (H) 03/18/2023   HCT 52.5 (H) 03/18/2023   MCV 94.4 03/18/2023   PLT 260 03/18/2023     Assessment and Plan: 1. Other fatigue (Primary) Not getting in enough sleep hrs. Advise getting in 7-8 hrs sleep at nights. H/H elev on last cbc done 03/2023 but pt never told that he snores loud.   - Vitamin B12; Future - VITAMIN D  25 Hydroxy (Vit-D Deficiency, Fractures); Future -CBC  2. Major depressive disorder, single episode, moderate (HCC) 3. GAD (generalized anxiety disorder) Pt plugged in with BH since last visit with me  4.  DM type 1 Advise to  call and reschedule with his endocrinologist to help with management of his DM  Follow Up Instructions: 3 mths   I discussed the assessment and treatment plan with the patient. The patient was provided an opportunity to ask questions and all were answered. The patient agreed with the plan and demonstrated an understanding of the instructions.   The patient was advised to call back or seek an in-person evaluation if the symptoms worsen or if the condition fails to improve as anticipated.  I spent 12 minutes dedicated to the care of this patient on the date of this encounter to include previsit review  of of my last note, face-to-face time with patient discussing diagnosis and management.  This note has been created with Education officer, environmental. Any transcriptional errors are unintentional.  Barnie Louder, MD

## 2023-08-23 ENCOUNTER — Other Ambulatory Visit: Payer: Self-pay | Admitting: Critical Care Medicine

## 2023-08-23 ENCOUNTER — Encounter (HOSPITAL_COMMUNITY): Payer: Self-pay

## 2023-08-23 ENCOUNTER — Other Ambulatory Visit: Payer: Self-pay

## 2023-08-25 ENCOUNTER — Ambulatory Visit (HOSPITAL_COMMUNITY): Payer: Medicaid Other | Admitting: Licensed Clinical Social Worker

## 2023-08-25 ENCOUNTER — Other Ambulatory Visit: Payer: Self-pay

## 2023-08-25 MED ORDER — GABAPENTIN 300 MG PO CAPS
300.0000 mg | ORAL_CAPSULE | Freq: Three times a day (TID) | ORAL | 5 refills | Status: AC
  Filled 2023-08-25 – 2023-10-15 (×2): qty 90, 30d supply, fill #0
  Filled 2023-12-22: qty 90, 30d supply, fill #1
  Filled 2024-03-10: qty 90, 30d supply, fill #2
  Filled 2024-04-12: qty 90, 30d supply, fill #3
  Filled 2024-05-22: qty 90, 30d supply, fill #4
  Filled 2024-07-04: qty 90, 30d supply, fill #0

## 2023-08-26 NOTE — Progress Notes (Unsigned)
BH MD Outpatient Progress Note  08/27/2023 9:53 AM Jerome Murphy  MRN:  [id number]  Assessment:  Jerome Murphy presents for follow-up evaluation. Today, 08/27/23, patient reports perhaps slight improvement in frequency and intensity of panic attacks now occurring a few times weekly. He reports sleep is somewhat improved and he is no longer napping during the day although continues to report anxiety interfering with falling and staying asleep. Given initial benefit thus far and tolerating medications well, will further titrate both Cymbalta and Atarax as below. Patient will highly benefit from consistent psychotherapy for increased support in living with illness that has so significantly impacted his life and sense of security. Encouraged patient to schedule follow up with nutritionist as well to obtain support in exploring foods conducive to glycemic control.  RTC in 2 months by video.  Identifying Information: Jerome Murphy is a 33 y.o. male with no formal past psychiatric history and medical history of T1DM with neuropathy, asthma, possible seizures in childhood, and Vitamin D deficiency who is an established patient with [facility]. Patient carries history of severely uncontrolled T1DM with significant weight loss over the past few years both of which have impacted sense of identity, self-worth, and belonging in peer groups. He reports that due to sudden drop in weight, he has frequently been accused by prior supports of abusing substances which has led him to avoid social outings. He additionally reports that fatigue related to hyperglycemia interferes with him engaging with family as desired. While he endorses low energy, motivation, appetite and sleep disturbance, and periods of hopelessness these symptoms are greatly confounded by uncontrolled T1DM and notably desire to be engaging with family/friends remains intact. He reports considerable  anxiety surrounding health and fear of hypoglycemic episodes leading to development of panic attacks and difficulty falling asleep at night. At this time, would conceptualize symptoms as adjustment reaction and anxiety disorder due to medical illness however will continue to monitor for development of MDD.   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   Subjective:  Chief Complaint:  Chief Complaint  Patient presents with   Medication Management    Interval History:   Patient reports he has been tolerating Cymbalta well although hasn't noticed much of a change in mood or anxiety thus far. Has been taking 60 mg total daily dose. Continues to have panic attacks a few times weekly. Lasts about 5-15 minutes and tries to calm himself down with deep breathing and cold water.   Reports sleep remains disrupted but this is primarily related to hypoglycemic episodes; has found Atarax helpful for resting at night. Sleeping less during the daytime; getting about 6 hours nightly. Feels Atarax takes the edge off somewhat although not entirely.  Denies SI or HI. States "I love myself" and if anything is worried about something bad may happen.   Just recently rescheduled with endo and  will continue working with them on glycemic management. He has a nutritionist he  follows with. Appetite is fair but has trouble choosing foods that are safe to eat. Reports feeling frustrated in trying to find the right foods.   Wasn't able to attend therapy appt due to transportation issues and would like to be rescheduled virtually.    Amenable to further titration of Cymbalta as well as PRN Atarax. All questions/concerns addressed.  Visit Diagnosis:    ICD-10-CM   1. Anxiety disorder due to general medical condition with panic attack  F06.4    F41.0     2. Adjustment disorder with depressed mood  F43.21       Past Psychiatric History:  Diagnoses: no prior formal psychiatric diagnoses Medication trials: denies Previous psychiatrist/therapist: denies Hospitalizations: denies Suicide attempts: denies SIB: denies Hx of violence towards others: denies Current access to guns: yes Hx of trauma/abuse: requires further evaluation Substance use:              -- Etoh: denies             -- Tobacco: 0.5-1 ppd             -- Denies use of illicit drugs including cannabis, CBD/THC, stimulants, BZDs, hallucinogens  Past Medical History:  Past Medical History:  Diagnosis Date   Asthma    Diabetes mellitus type I (HCC)    Diabetes mellitus without complication (HCC)    DKA (diabetic ketoacidosis) (HCC) 05/18/2022   History of gallstones    Narcolepsy    Seizures (HCC)     Past Surgical History:  Procedure Laterality Date   CIRCUMCISION     HERNIA REPAIR     TONSILLECTOMY      Family Psychiatric History:  Mom: suicide attempt Dad: depression following MI  Family History:  Family History  Problem Relation Age of Onset   Diabetes Mother    Suicidality Mother    Depression Father    Diabetes Maternal Uncle     Social History:  Academic/Vocational: 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   Social History   Socioeconomic History   Marital status: Single    Spouse name: Not on file   Number of children: 2   Years of education: Not on file    Highest education level: Associate degree: occupational, Scientist, product/process development, or vocational program  Occupational History   Occupation: Barbershop  Tobacco Use   Smoking status: Every Day    Current packs/day: 0.50    Types: Cigarettes   Smokeless tobacco: Never  Vaping Use   Vaping status: Never Used  Substance and Sexual Activity   Alcohol use: No    Comment: occasionallly   Drug use: No   Sexual activity: Yes    Birth control/protection: Condom    Comment: Partner uses Depo  Other Topics Concern   Not on file  Social History Narrative   Not on file   Social Drivers of Health   Financial Resource Strain: High Risk (06/30/2023)   Received from [facility]   Overall Financial Resource Strain (CARDIA)    Difficulty of Paying Living Expenses: Hard  Food Insecurity: Food Insecurity Present (06/30/2023)   [facility] from [facility]   Hunger Vital Sign    Worried About Running Out of Food in the Last Year: Often true    Ran Out of Food in the Last Year: Often true  Transportation Needs: Unmet Transportation Needs (06/30/2023)   [facility]   PRAPARE - Transportation    In the past 12 months, has lack of transportation kept you from medical appointments or from getting medications?: Yes    Lack of Transportation (Non-Medical): Yes  Physical Activity: Inactive (05/08/2023)   Exercise Vital Sign    Days of Exercise per Week: 0 days    Minutes of Exercise per Session: 0 min  Stress: Stress Concern Present (05/08/2023)   [employer] of Occupational Health - Occupational Stress Questionnaire    Feeling of Stress : Rather much  Social Connections: Moderately Isolated (05/08/2023)   Social Connection and Isolation Panel [NHANES]    Frequency of Communication with Friends and Family: Twice a week    Frequency of Social Gatherings with Friends and Family: Twice a week    Attends Religious Services: More than 4 times per  year    Active Member of [employer] or Organizations: No    Attends Engineer, structural: Not on file    Marital Status: Never married    Allergies:  Allergies  Allergen Reactions   Robitussin [Guaifenesin] Hives and Itching    Current Medications: Current Outpatient Medications  Medication Sig Dispense Refill   DULoxetine (CYMBALTA) 30 MG capsule Take 1 capsule (30 mg total) daily for 1 week then increase to 2 capsules (60 mg total) daily 60 capsule 2   gabapentin (NEURONTIN) 300 MG capsule Take 1 capsule (300 mg total) by mouth 3 (three) times daily. 90 capsule 5   hydrOXYzine (ATARAX) 25 MG tablet Take 1 tablet (25 mg total) by mouth 2 (two) times daily as needed (panic attacks). 60 tablet 2   albuterol (VENTOLIN HFA) 108 (90 Base) MCG/ACT inhaler Inhale 2 puffs into the lungs every 6 (six) hours as needed for wheezing or shortness of breath. 8 g 2   Continuous Glucose Sensor (DEXCOM G7 SENSOR) MISC use as directed for continuous glucose monitoring 9 each 3   insulin aspart (NOVOLOG) 100 UNIT/ML injection Use via insulin pump 60 units/day. 20 mL 11   Insulin Disposable Pump (OMNIPOD 5 DEXG7G6 INTRO GEN 5) KIT 1 each by Does not apply route as needed. 1 kit 0   Insulin Disposable Pump (OMNIPOD 5 DEXG7G6 PODS GEN 5) MISC use as directed change every 3 (three) days. 30 each 3   Insulin Pen Needle (PEN NEEDLES 3/16") 31G X 5 MM MISC Use 4 times a day 300 each 4   Insulin Syringe-Needle U-100 (INSULIN SYRINGE 1CC/31GX5/16") 31G X 5/16" 1 ML MISC 1 Device by Does not apply route daily. 100 each 3   No current facility-administered medications for this visit.    ROS: As outlined above  Objective:  Psychiatric Specialty Exam: There were no vitals taken for this visit.There is no height or weight on file to calculate BMI.  General Appearance: Casual and Fairly Groomed  Eye Contact:  Good  Speech:  Clear and Coherent and Normal Rate  Volume:  Normal  Mood:   "the same"  Affect:    Euthymic; mildly anxious  Thought Content:  Denies AVH; no overt delusional thought content on interview      Suicidal Thoughts:  No  Homicidal Thoughts:  No  Thought Process:  Goal Directed and Linear  Orientation:  Full (Time, Place, and Person)    Memory:  Grossly intact  Judgment:  Good  Insight:  Good  Concentration:  Concentration: Good  Recall:  not formally assessed   Fund of Knowledge: Fair  Language: Good  Psychomotor Activity:  Normal  Akathisia:  NA  AIMS (if indicated): NA  Assets:  Communication Skills Desire for Improvement Housing Intimacy Resilience Transportation  ADL's:  Intact  Cognition: WNL  Sleep:   slight improvement   PE: General: sits comfortably in view of camera; no acute distress  Pulm: no increased work of breathing on room air  MSK: all extremity movements appear intact  Neuro: no focal neurological deficits observed  Gait & Station: unable to assess by video    Metabolic Disorder Labs: Lab Results  Component Value Date   HGBA1C 14.3 (A) 04/29/2023   MPG 369 02/10/2023   MPG 352.23 05/18/2022   No results found for: "PROLACTIN" Lab Results  Component Value Date   CHOL 132 08/28/2020   TRIG 63 08/28/2020   HDL 37 (L) 08/28/2020   CHOLHDL 3.6 08/28/2020   LDLCALC 82 08/28/2020   LDLCALC 107 (H) 08/29/2019   Lab Results  Component Value Date   TSH 0.973 06/15/2023   TSH 1.130 08/28/2020    Therapeutic Level Labs: No results found for: "LITHIUM" No results found for: "VALPROATE" No results found for: "CBMZ"  Screenings:  GAD-7    Flowsheet Row Office Visit from 06/15/2023 in [facility] [place] - A Dept Of [place]. [facility] Office Visit from 05/08/2023 in [facility] [place] - A Dept Of [place]. [facility] Office Visit from 02/18/2023 in [facility] [place] - A Dept Of [place] Brideg[place]. [facility] Office Visit from [facility]/25/2020 [facility]  Total GAD-7  Score 8 6 1  0      PHQ2-9    Flowsheet Row Office Visit from 06/15/2023 in [facility] [place] - A Dept Of [place]. [facility] from 06/14/2023 in [facility] [place] - A Dept Of [place]. [facility] Office Visit from 05/08/2023 in [facility] [place] - A Dept Of [place]. [facility] Nutrition from 03/29/2023 in [facility]  - A Dept Of [place]. [facility] Office Visit from 02/18/2023 in [facility] [place] - A Dept Of [place] Brideg[place]. [facility]  PHQ-2 Total Score 4 4 4  0 1  PHQ-9 Total Score 10 10 9  -- 4      Flowsheet Row ED from 03/18/2023 in [facility] Emergency Department at [facility] ED to Hosp-Admission (Discharged) from 02/10/2023 in [facility]CCU ED to Hosp-Admission (Discharged) from 07/09/2022 in [facility] GENERAL SURGERY  C-SSRS RISK CATEGORY No Risk No Risk No Risk       Collaboration of Care: Collaboration of Care: Medication Management AEB active medication management, Psychiatrist AEB established with this provider, and Referral or follow-up with counselor/therapist AEB patient to get rescheduled for individual therapy  Patient/Guardian was advised Release of Information must be obtained prior to any record release in order to collaborate their care with an outside provider. Patient/Guardian was advised if they have not already done so to contact the registration department to sign all necessary forms in order for [place] to release information regarding their care.   Consent: Patient/Guardian gives verbal consent for treatment and assignment of benefits for services provided during this visit. Patient/Guardian expressed understanding and agreed to proceed.   Televisit via video: I connected with patient on 08/27/23 at  9:30 AM EST by a video enabled telemedicine application and  verified that  I am speaking with the correct person using two identifiers.  Location: Patient: home address in [place] Provider: remote office in    I discussed the limitations of evaluation and management by telemedicine and the availability of in person appointments. The patient expressed understanding and agreed to proceed.  I discussed the assessment and treatment plan with the patient. The patient was provided an opportunity to ask questions and all were answered. The patient agreed with the plan and demonstrated an understanding of the instructions.   The patient was advised to call back or seek an in-person evaluation if the symptoms worsen or if the condition fails to improve as anticipated.  I provided 35 minutes dedicated to the care of this patient via video on the date of this encounter to include chart review, face-to-face time with the patient, medication management/counseling, documentation.  Aliani Caccavale A Odell Choung 08/27/2023, 9:53 AM

## 2023-08-27 ENCOUNTER — Telehealth (HOSPITAL_COMMUNITY): Payer: Medicaid Other | Admitting: Psychiatry

## 2023-08-27 ENCOUNTER — Other Ambulatory Visit: Payer: Self-pay

## 2023-08-27 ENCOUNTER — Encounter (HOSPITAL_COMMUNITY): Payer: Self-pay | Admitting: Psychiatry

## 2023-08-27 DIAGNOSIS — E109 Type 1 diabetes mellitus without complications: Secondary | ICD-10-CM

## 2023-08-27 DIAGNOSIS — F410 Panic disorder [episodic paroxysmal anxiety] without agoraphobia: Secondary | ICD-10-CM | POA: Diagnosis not present

## 2023-08-27 DIAGNOSIS — F064 Anxiety disorder due to known physiological condition: Secondary | ICD-10-CM | POA: Diagnosis not present

## 2023-08-27 DIAGNOSIS — F4321 Adjustment disorder with depressed mood: Secondary | ICD-10-CM

## 2023-08-27 MED ORDER — DULOXETINE HCL 30 MG PO CPEP
ORAL_CAPSULE | ORAL | 2 refills | Status: DC
  Filled 2023-08-27 – 2023-10-15 (×2): qty 90, 30d supply, fill #0

## 2023-08-27 MED ORDER — HYDROXYZINE HCL 25 MG PO TABS
25.0000 mg | ORAL_TABLET | Freq: Two times a day (BID) | ORAL | 2 refills | Status: DC | PRN
  Filled 2023-08-27: qty 120, 30d supply, fill #0

## 2023-08-27 NOTE — Patient Instructions (Signed)
Thank you for attending your appointment today.  -- INCREASE Cymbalta to 30 mg (1 capsule) in the morning + 60 mg (2 capsules) at night -- INCREASE hydroxyzine to 25-50 mg (1-2 tablets) up to twice daily as needed for panic attacks or sleep -- Continue other medications as prescribed.  Please do not make any changes to medications without first discussing with your provider. If you are experiencing a psychiatric emergency, please call 911 or present to your nearest emergency department. Additional crisis, medication management, and therapy resources are included below.  [facility]  [address], [place], [place] [address] [phone] WALK-IN URGENT CARE 24/7 FOR ANYONE [address] [place], [place]  [phone] Fax: [phone] [email or web address] *Interpreters available *Accepts all insurance and uninsured for Urgent Care needs *Accepts Medicaid and uninsured for outpatient treatment (below)      ONLY FOR [facility]  Below:    Outpatient New Patient Assessment/Therapy Walk-ins:        Monday, Wednesday, and Thursday 8am until slots are full (first come, first served)                   New Patient Psychiatry/Medication Management        Monday-Friday 8am-11am (first come, first served)               For all walk-ins we ask that you arrive by 7:15am, because patients will be seen in the order of arrival.

## 2023-09-03 ENCOUNTER — Other Ambulatory Visit: Payer: Self-pay

## 2023-09-07 ENCOUNTER — Other Ambulatory Visit: Payer: Self-pay

## 2023-09-15 ENCOUNTER — Other Ambulatory Visit: Payer: Self-pay

## 2023-09-16 ENCOUNTER — Ambulatory Visit: Payer: Self-pay | Admitting: Internal Medicine

## 2023-09-16 NOTE — Telephone Encounter (Signed)
  Chief Complaint: requesting another prescription for insulin aspart novolog for insulin pump until regular prescription can be filled tomorrow. Per patient mother on [place]. Mother reports patient is always running out of insulin too early.  Symptoms: denies sx of hyperglycemia. Dexcom readings "high" no value given. Patient out of insulin since yesterday .  Frequency: yesterday  Pertinent Negatives: Patient denies headache no blurred vision no thirst no frequency urinating no dizziness. Disposition: [] ED /[] Urgent Care (no appt availability in office) / [] Appointment(In office/virtual)/ []  [place] [facility]/ [] Home Care/ [x] Refused Recommended Disposition /[]  Mobile Bus/ []  Follow-up with PCP Additional Notes:   Recommended mobile bus to check blood sugar and possibly give patient some insulin. Patient mother reports she still has old lantus insulin and metformin if patient can take until prescription filled.  Patient mother did not say if she will try mobile bus but would like a call back asap. Please advise.  Recommended patient mother to call diabetic provider Iraq Thapa, MD as well. CAL notified of medication needs and unsure if will go to mobile bus to check blood sugar      Copied from CRM [id number]. Topic: Clinical - Prescription Issue >> Sep 16, 2023  9:42 AM Franchot Heidelberg wrote: Reason for CRM: Pt's mother called reporting that the patient's blood sugar is running high and that he is completely out of insulin and has the wrong dose. She says he needs a higher dose and is demanding to speak to a nurse  Best contact: [phone] Reason for Disposition  [1] Caller has URGENT medicine question about med that PCP or specialist prescribed AND [2] triager unable to answer question  Answer Assessment - Initial Assessment Questions 1. NAME of MEDICINE: "What medicine(s) are you calling about?"     Insulin aspart novolog 60 units daily via pump per patient mother on DPR  2.  QUESTION: "What is your question?" (e.g., double dose of medicine, side effect)     Can prescription be sent to pharmacy for additional insulin until regular prescription can be filled tomorrow due to insurance will not allow for "extra insulin" to be filled without PCP approval? 3. PRESCRIBER: "Who prescribed the medicine?" Reason: if prescribed by specialist, call should be referred to that group.     Iraq Thapa, MD 4. SYMPTOMS: "Do you have any symptoms?" If Yes, ask: "What symptoms are you having?"  "How bad are the symptoms (e.g., mild, moderate, severe)     Denies only reports dexcom shows "high" reading  5. PREGNANCY:  "Is there any chance that you are pregnant?" "When was your last menstrual period?"     na  Protocols used: Medication Question Call-A-AH

## 2023-09-16 NOTE — Telephone Encounter (Signed)
 Call to patient mother , unable to reach VM left

## 2023-09-16 NOTE — Telephone Encounter (Signed)
 Please advised

## 2023-09-16 NOTE — Telephone Encounter (Signed)
 Looks like still has RF on Novolog from his endocrinologist.  I suggest he speak with his endocrinologist to figure out why he is running out much sooner than he should.  Needs to schedule f/u with Dr. Cornell Barman.

## 2023-09-17 ENCOUNTER — Other Ambulatory Visit: Payer: Self-pay

## 2023-09-17 NOTE — Telephone Encounter (Signed)
 Call placed to patient unable to reach message left on VM.

## 2023-09-20 ENCOUNTER — Emergency Department

## 2023-09-20 ENCOUNTER — Observation Stay
Admission: EM | Admit: 2023-09-20 | Discharge: 2023-09-21 | Disposition: A | Discharge status: Home / Self Care | Attending: Osteopathic Medicine | Admitting: Osteopathic Medicine

## 2023-09-20 ENCOUNTER — Other Ambulatory Visit: Payer: Self-pay

## 2023-09-20 DIAGNOSIS — Z794 Long term (current) use of insulin: Secondary | ICD-10-CM

## 2023-09-20 DIAGNOSIS — E1010 Type 1 diabetes mellitus with ketoacidosis without coma: Principal | ICD-10-CM

## 2023-09-20 DIAGNOSIS — E871 Hypo-osmolality and hyponatremia: Secondary | ICD-10-CM | POA: Diagnosis present

## 2023-09-20 DIAGNOSIS — Z833 Family history of diabetes mellitus: Secondary | ICD-10-CM

## 2023-09-20 DIAGNOSIS — F32A Depression, unspecified: Secondary | ICD-10-CM | POA: Insufficient documentation

## 2023-09-20 DIAGNOSIS — Z79899 Other long term (current) drug therapy: Secondary | ICD-10-CM

## 2023-09-20 DIAGNOSIS — G47419 Narcolepsy without cataplexy: Secondary | ICD-10-CM | POA: Diagnosis present

## 2023-09-20 DIAGNOSIS — R0602 Shortness of breath: Secondary | ICD-10-CM | POA: Diagnosis present

## 2023-09-20 DIAGNOSIS — G629 Polyneuropathy, unspecified: Secondary | ICD-10-CM

## 2023-09-20 DIAGNOSIS — Z9641 Presence of insulin pump (external) (internal): Secondary | ICD-10-CM | POA: Diagnosis present

## 2023-09-20 DIAGNOSIS — G40909 Epilepsy, unspecified, not intractable, without status epilepticus: Secondary | ICD-10-CM | POA: Diagnosis present

## 2023-09-20 DIAGNOSIS — Z818 Family history of other mental and behavioral disorders: Secondary | ICD-10-CM | POA: Diagnosis not present

## 2023-09-20 DIAGNOSIS — J45909 Unspecified asthma, uncomplicated: Secondary | ICD-10-CM | POA: Diagnosis present

## 2023-09-20 DIAGNOSIS — Z888 Allergy status to other drugs, medicaments and biological substances status: Secondary | ICD-10-CM

## 2023-09-20 DIAGNOSIS — F17210 Nicotine dependence, cigarettes, uncomplicated: Secondary | ICD-10-CM | POA: Diagnosis present

## 2023-09-20 DIAGNOSIS — E1110 Type 2 diabetes mellitus with ketoacidosis without coma: Secondary | ICD-10-CM | POA: Diagnosis present

## 2023-09-20 LAB — CBC
HCT: 43.3 % (ref 39.0–52.0)
HCT: 49 % (ref 39.0–52.0)
Hemoglobin: 15.4 g/dL (ref 13.0–17.0)
Hemoglobin: 17.4 g/dL — ABNORMAL HIGH (ref 13.0–17.0)
MCH: 34.3 pg — ABNORMAL HIGH (ref 26.0–34.0)
MCH: 34.3 pg — ABNORMAL HIGH (ref 26.0–34.0)
MCHC: 35.5 g/dL (ref 30.0–36.0)
MCHC: 35.6 g/dL (ref 30.0–36.0)
MCV: 96.4 fL (ref 80.0–100.0)
MCV: 96.5 fL (ref 80.0–100.0)
Platelets: 300 10*3/uL (ref 150–400)
Platelets: 335 10*3/uL (ref 150–400)
RBC: 4.49 MIL/uL (ref 4.22–5.81)
RBC: 5.08 MIL/uL (ref 4.22–5.81)
RDW: 11.4 % — ABNORMAL LOW (ref 11.5–15.5)
RDW: 11.4 % — ABNORMAL LOW (ref 11.5–15.5)
WBC: 6.8 10*3/uL (ref 4.0–10.5)
WBC: 8.2 10*3/uL (ref 4.0–10.5)
nRBC: 0 % (ref 0.0–0.2)
nRBC: 0 % (ref 0.0–0.2)

## 2023-09-20 LAB — BASIC METABOLIC PANEL
Anion gap: 22 — ABNORMAL HIGH (ref 5–15)
Anion gap: 19 — ABNORMAL HIGH (ref 5–15)
Anion gap: 9 (ref 5–15)
BUN: 15 mg/dL (ref 6–20)
BUN: 14 mg/dL (ref 6–20)
BUN: 13 mg/dL (ref 6–20)
CO2: 12 mmol/L — ABNORMAL LOW (ref 22–32)
CO2: 9 mmol/L — ABNORMAL LOW (ref 22–32)
CO2: 16 mmol/L — ABNORMAL LOW (ref 22–32)
Calcium: 10.2 mg/dL (ref 8.9–10.3)
Calcium: 9.3 mg/dL (ref 8.9–10.3)
Calcium: 8.7 mg/dL — ABNORMAL LOW (ref 8.9–10.3)
Chloride: 98 mmol/L (ref 98–111)
Chloride: 104 mmol/L (ref 98–111)
Chloride: 109 mmol/L (ref 98–111)
Creatinine, Ser: 1.22 mg/dL (ref 0.61–1.24)
Creatinine, Ser: 1.07 mg/dL (ref 0.61–1.24)
Creatinine, Ser: 0.82 mg/dL (ref 0.61–1.24)
GFR, Estimated: >60 mL/min (ref >60)
GFR, Estimated: >60 mL/min (ref >60)
GFR, Estimated: >60 mL/min (ref >60)
Glucose, Bld: 339 mg/dL — ABNORMAL HIGH (ref 70–99)
Glucose, Bld: 321 mg/dL — ABNORMAL HIGH (ref 70–99)
Glucose, Bld: 181 mg/dL — ABNORMAL HIGH (ref 70–99)
Potassium: 4.4 mmol/L (ref 3.5–5.1)
Potassium: 5.4 mmol/L — ABNORMAL HIGH (ref 3.5–5.1)
Potassium: 5.1 mmol/L (ref 3.5–5.1)
Sodium: 132 mmol/L — ABNORMAL LOW (ref 135–145)
Sodium: 132 mmol/L — ABNORMAL LOW (ref 135–145)
Sodium: 134 mmol/L — ABNORMAL LOW (ref 135–145)

## 2023-09-20 LAB — CBG MONITORING, ED
Glucose-Capillary: 306 mg/dL — ABNORMAL HIGH (ref 70–99)
Glucose-Capillary: 225 mg/dL — ABNORMAL HIGH (ref 70–99)
Glucose-Capillary: 203 mg/dL — ABNORMAL HIGH (ref 70–99)
Glucose-Capillary: 197 mg/dL — ABNORMAL HIGH (ref 70–99)
Glucose-Capillary: 160 mg/dL — ABNORMAL HIGH (ref 70–99)
Glucose-Capillary: 157 mg/dL — ABNORMAL HIGH (ref 70–99)
Glucose-Capillary: 129 mg/dL — ABNORMAL HIGH (ref 70–99)
Glucose-Capillary: 113 mg/dL — ABNORMAL HIGH (ref 70–99)
Glucose-Capillary: 135 mg/dL — ABNORMAL HIGH (ref 70–99)

## 2023-09-20 LAB — URINALYSIS, ROUTINE W REFLEX MICROSCOPIC
Bacteria, UA: NONE SEEN
Bilirubin Urine: NEGATIVE
Glucose, UA: >=500 mg/dL — AB
Hgb urine dipstick: NEGATIVE
Ketones, ur: 80 mg/dL — AB
Leukocytes,Ua: NEGATIVE
Nitrite: NEGATIVE
Protein, ur: 30 mg/dL — AB
RBC / HPF: 0 RBC/hpf (ref 0–5)
Specific Gravity, Urine: 1.018 (ref 1.005–1.030)
pH: 5 (ref 5.0–8.0)

## 2023-09-20 LAB — BETA-HYDROXYBUTYRIC ACID
Beta-Hydroxybutyric Acid: >8 mmol/L — ABNORMAL HIGH (ref 0.05–0.27)
Beta-Hydroxybutyric Acid: >8 mmol/L — ABNORMAL HIGH (ref 0.05–0.27)

## 2023-09-20 LAB — GLUCOSE, CAPILLARY: Glucose-Capillary: 278 mg/dL — ABNORMAL HIGH (ref 70–99)

## 2023-09-20 LAB — TROPONIN I (HIGH SENSITIVITY): Troponin I (High Sensitivity): 3 ng/L (ref <18)

## 2023-09-20 MED ORDER — DULOXETINE HCL 30 MG PO CPEP
60.0000 mg | ORAL_CAPSULE | Freq: Every day | ORAL | Status: DC
  Administered 2023-09-20: 60 mg via ORAL
  Filled 2023-09-20: qty 2

## 2023-09-20 MED ORDER — GABAPENTIN 300 MG PO CAPS
300.0000 mg | ORAL_CAPSULE | Freq: Three times a day (TID) | ORAL | Status: DC
  Administered 2023-09-20 – 2023-09-21 (×4): 300 mg via ORAL
  Filled 2023-09-20 (×4): qty 1

## 2023-09-20 MED ORDER — INSULIN REGULAR(HUMAN) IN NACL 100-0.9 UT/100ML-% IV SOLN
INTRAVENOUS | Status: AC
  Administered 2023-09-20: 10 [IU]/h via INTRAVENOUS
  Filled 2023-09-20: qty 100

## 2023-09-20 MED ORDER — HYDROXYZINE HCL 25 MG PO TABS
25.0000 mg | ORAL_TABLET | Freq: Two times a day (BID) | ORAL | Status: DC | PRN
Start: 2023-09-20 — End: 2023-09-21

## 2023-09-20 MED ORDER — ACETAMINOPHEN 325 MG PO TABS
650.0000 mg | ORAL_TABLET | ORAL | Status: DC | PRN
  Administered 2023-09-20: 650 mg via ORAL

## 2023-09-20 MED ORDER — ALUM & MAG HYDROXIDE-SIMETH 200-200-20 MG/5ML PO SUSP
30.0000 mL | ORAL | Status: DC | PRN
  Administered 2023-09-20: 30 mL via ORAL
  Filled 2023-09-20: qty 30

## 2023-09-20 MED ORDER — ONDANSETRON HCL 4 MG/2ML IJ SOLN
4.0000 mg | Freq: Once | INTRAMUSCULAR | Status: AC
  Administered 2023-09-20: 4 mg via INTRAVENOUS
  Filled 2023-09-20: qty 2

## 2023-09-20 MED ORDER — LACTATED RINGERS IV SOLN: INTRAVENOUS | Status: DC

## 2023-09-20 MED ORDER — INSULIN ASPART 100 UNIT/ML IJ SOLN
0.0000 [IU] | Freq: Every day | INTRAMUSCULAR | Status: DC
  Administered 2023-09-20: 3 [IU] via SUBCUTANEOUS
  Filled 2023-09-20: qty 1

## 2023-09-20 MED ORDER — INSULIN ASPART 100 UNIT/ML IJ SOLN
0.0000 [IU] | Freq: Three times a day (TID) | INTRAMUSCULAR | Status: DC
  Administered 2023-09-20: 2 [IU] via SUBCUTANEOUS
  Administered 2023-09-21: 8 [IU] via SUBCUTANEOUS
  Filled 2023-09-20 (×2): qty 1

## 2023-09-20 MED ORDER — ACETAMINOPHEN 325 MG PO TABS
650.0000 mg | ORAL_TABLET | ORAL | Status: DC | PRN
  Administered 2023-09-20: 650 mg via ORAL
  Filled 2023-09-20: qty 2

## 2023-09-20 MED ORDER — TRAZODONE HCL 50 MG PO TABS: 25.0000 mg | ORAL_TABLET | Freq: Every evening | ORAL | Status: DC | PRN

## 2023-09-20 MED ORDER — DULOXETINE HCL 30 MG PO CPEP
30.0000 mg | ORAL_CAPSULE | Freq: Every day | ORAL | Status: DC
  Administered 2023-09-20 – 2023-09-21 (×2): 30 mg via ORAL
  Filled 2023-09-20 (×2): qty 1

## 2023-09-20 MED ORDER — INSULIN ASPART 100 UNIT/ML IJ SOLN: 4.0000 [IU] | Freq: Three times a day (TID) | INTRAMUSCULAR | Status: DC

## 2023-09-20 MED ORDER — INSULIN GLARGINE-YFGN 100 UNIT/ML ~~LOC~~ SOLN
15.0000 [IU] | SUBCUTANEOUS | Status: AC
  Administered 2023-09-20: 15 [IU] via SUBCUTANEOUS
  Filled 2023-09-20: qty 0.15

## 2023-09-20 MED ORDER — ENOXAPARIN SODIUM 40 MG/0.4ML IJ SOSY
40.0000 mg | PREFILLED_SYRINGE | INTRAMUSCULAR | Status: DC
  Administered 2023-09-20 – 2023-09-21 (×2): 40 mg via SUBCUTANEOUS
  Filled 2023-09-20 (×2): qty 0.4

## 2023-09-20 MED ORDER — POTASSIUM CHLORIDE 10 MEQ/100ML IV SOLN
10.0000 meq | INTRAVENOUS | Status: AC
  Administered 2023-09-20 (×2): 10 meq via INTRAVENOUS
  Filled 2023-09-20: qty 100

## 2023-09-20 MED ORDER — ONDANSETRON HCL 4 MG PO TABS
4.0000 mg | ORAL_TABLET | Freq: Four times a day (QID) | ORAL | Status: DC | PRN
Start: 2023-09-20 — End: 2023-09-21

## 2023-09-20 MED ORDER — SERTRALINE HCL 50 MG PO TABS
50.0000 mg | ORAL_TABLET | Freq: Every day | ORAL | Status: DC
  Administered 2023-09-20: 50 mg via ORAL
  Filled 2023-09-20: qty 1

## 2023-09-20 MED ORDER — DEXTROSE IN LACTATED RINGERS 5 % IV SOLN: INTRAVENOUS | Status: AC

## 2023-09-20 MED ORDER — DEXTROSE 50 % IV SOLN: 0.0000 mL | INTRAVENOUS | Status: DC | PRN

## 2023-09-20 MED ORDER — ACETAMINOPHEN 325 MG PO TABS
650.0000 mg | ORAL_TABLET | Freq: Four times a day (QID) | ORAL | Status: DC | PRN
  Filled 2023-09-20: qty 2

## 2023-09-20 MED ORDER — MAGNESIUM HYDROXIDE 400 MG/5ML PO SUSP: 30.0000 mL | Freq: Every day | ORAL | Status: DC | PRN

## 2023-09-20 MED ORDER — ONDANSETRON HCL 4 MG/2ML IJ SOLN
4.0000 mg | Freq: Four times a day (QID) | INTRAMUSCULAR | Status: DC | PRN
Start: 2023-09-20 — End: 2023-09-21

## 2023-09-20 MED ORDER — ACETAMINOPHEN 650 MG RE SUPP: 650.0000 mg | Freq: Four times a day (QID) | RECTAL | Status: DC | PRN

## 2023-09-20 MED ORDER — LACTATED RINGERS IV BOLUS
2000.0000 mL | Freq: Once | INTRAVENOUS | Status: AC
  Administered 2023-09-20: 2000 mL via INTRAVENOUS

## 2023-09-20 NOTE — Assessment & Plan Note (Signed)
-

## 2023-09-20 NOTE — Hospital Course (Addendum)
 Hospital course / significant events:   HPI: Nazair Fortenberry is a 33 y.o. African-American male with medical history significant for asthma, type 1 diabetes mellitus with history of DKA,Seizure disorder and narcolepsy, who presented to the emergency room with acute onset of with associated generalized weakness, flulike symptoms w/ N/V, assoc w/ polyuria/polydipsia.   03/10: admitted to hospitalist service for DKA. Off insulin drip this afternoon 03/11:      Consultants:  ***  Procedures/Surgeries: ***      ASSESSMENT & PLAN:   DKA (diabetic ketoacidosis)  stepdown bed. IV insulin drip per [facility] DKA protocol. hydrated with IV normal saline. follow serial BMPs.  Depression continue Zoloft.   Peripheral neuropathy continue Neurontin and Cymbalta.     *** based on BMI: Body mass index is 21.76 kg/m.  Underweight - under 18  overweight - 25 to 29 obese - 30 or more Class 1 obesity: BMI of 30.0 to 34 Class 2 obesity: BMI of 35.0 to 39 Class 3 obesity: BMI of 40.0 to 49 Super Morbid Obesity: BMI 50-59 Super-super Morbid Obesity: BMI 60+ Significantly low or high BMI is associated with higher medical risk.  Weight management advised as adjunct to other disease management and risk reduction treatments    DVT prophylaxis: *** IV fluids: *** continuous IV fluids  Nutrition: *** Central lines / invasive devices: ***  Code Status: *** ACP documentation reviewed: *** none on file in [facility]  TOC needs: *** Barriers to dispo / significant pending items: ***

## 2023-09-20 NOTE — ED Triage Notes (Signed)
 Pt to ed from home via POV for SOB and medication refill. Pt is caox4, in no acute distress. Pt advised he has been feeling bad since Friday. Pt states "I didn't eat all day bc I am short on insulin".

## 2023-09-20 NOTE — Plan of Care (Signed)
  Problem: Coping: Goal: Ability to adjust to condition or change in health will improve Outcome: Progressing   Problem: Fluid Volume: Goal: Ability to maintain a balanced intake and output will improve Outcome: Progressing   Problem: Health Behavior/Discharge Planning: Goal: Ability to manage health-related needs will improve Outcome: Progressing

## 2023-09-20 NOTE — ED Notes (Signed)
 Called [facility].

## 2023-09-20 NOTE — ED Notes (Signed)
 Meal order placed for patient.

## 2023-09-20 NOTE — Progress Notes (Signed)
  Brief Progress Note (See full H&P from earlier today)   Pt seen in ED, no new concerns   Off insulin drip around mid-day Diabetes coordinator to follow SSI + basal Of note, not adherent to home insulin pump If Glc remain stable / titrating insulin and confirm he has supplies, meds, understanding of dosing, anticipate may be able to discharge tomorrow

## 2023-09-20 NOTE — H&P (Signed)
 [place]   PATIENT NAME: Jerome Murphy    MR#:  [id number]  DATE OF BIRTH:  03-22-1991  DATE OF ADMISSION:  09/20/2023  PRIMARY CARE PHYSICIAN: Marcine Matar, MD   Patient is coming from: Home  REQUESTING/REFERRING PHYSICIAN: Ward, Layla Maw, DO  CHIEF COMPLAINT:   Chief Complaint  Patient presents with   Medication Refill   Shortness of Breath    HISTORY OF PRESENT ILLNESS:  Jerome Murphy is a 33 y.o. African-American male with medical history significant for asthma, type 1 diabetes mellitus with history of DKA,Seizure disorder and narcolepsy, who presented to the emergency room with acute onset of with associated generalized weakness.  He thought he had the flu.  He admitted to polyuria and polydipsia.  He has not been eating or drinking much.  He admitted to nausea and vomiting here.  No diarrhea or melena or bright red bleeding per rectum.  He denied any bilious vomitus or hematemesis.  No fever or chills.  No dysuria, oliguria or hematuria or flank pain.  No chest pain or palpitations.  No cough or wheezing or dyspnea.  ED Course: When the patient came to the ER, BP was 119/93 with heart rate of 130 and respiratory rate of 22 and otherwise normal vital signs.  Labs revealed blood gases with pH 7.21 and HCO3 of 8.4 with pCO2 of 21 pO2 of 84 and O2 sat of 95.8%.  BMP revealed mild hyponatremia 132 and blood glucose was 339 with a CO2 of 12 and anion gap of 22.  CBC showed hemoconcentration.  Urinalysis came back with 30 protein and more than 500 glucose with 80 ketones.. EKG as reviewed by me : Sinus tachycardia with rate 116 with right atrial enlargement. Imaging: Two-view chest x-ray showed no acute cardiopulmonary disease.  The patient was placed on IV Endo tool DKA protocol.  He will be admitted to a stepdown unit bed for further evaluation and management. PAST MEDICAL HISTORY:   Past Medical History:  Diagnosis Date   Asthma    Diabetes  mellitus type I (HCC)    Diabetes mellitus without complication (HCC)    DKA (diabetic ketoacidosis) (HCC) 05/18/2022   History of gallstones    Narcolepsy    Seizures (HCC)     PAST SURGICAL HISTORY:   Past Surgical History:  Procedure Laterality Date   CIRCUMCISION     HERNIA REPAIR     TONSILLECTOMY      SOCIAL HISTORY:   Social History   Tobacco Use   Smoking status: Every Day    Current packs/day: 0.50    Types: Cigarettes   Smokeless tobacco: Never  Substance Use Topics   Alcohol use: No    Comment: occasionallly    FAMILY HISTORY:   Family History  Problem Relation Age of Onset   Diabetes Mother    Suicidality Mother    Depression Father    Diabetes Maternal Uncle     DRUG ALLERGIES:   Allergies  Allergen Reactions   Robitussin [Guaifenesin] Hives and Itching    REVIEW OF SYSTEMS:   ROS As per history of present illness. All pertinent systems were reviewed above. Constitutional, HEENT, cardiovascular, respiratory, GI, GU, musculoskeletal, neuro, psychiatric, endocrine, integumentary and hematologic systems were reviewed and are otherwise negative/unremarkable except for positive findings mentioned above in the HPI.   MEDICATIONS AT HOME:   Prior to Admission medications   Medication Sig Start Date End Date Taking? Authorizing Provider  DULoxetine (CYMBALTA) 30 MG capsule Take 1 capsule (30 mg total) by mouth in the morning AND 2 capsules (60 mg total) at bedtime. 08/27/23  Yes Bahraini, Sarah A  gabapentin (NEURONTIN) 300 MG capsule Take 1 capsule (300 mg total) by mouth 3 (three) times daily. 08/25/23  Yes Marcine Matar, MD  hydrOXYzine (ATARAX) 25 MG tablet Take 1-2 tablets (25-50 mg total) by mouth 2 (two) times daily as needed (panic attacks or sleep). 08/27/23 11/25/23 Yes [place]raini, Sarah A  insulin aspart (NOVOLOG) 100 UNIT/ML injection Use via insulin pump 60 units/day. 05/04/23  Yes Thapa, [place], MD  Insulin Disposable Pump (OMNIPOD 5  DEXG7G6 INTRO GEN 5) KIT 1 each by Does not apply route as needed. 07/02/23  Yes Thapa, [place], MD  sertraline (ZOLOFT) 50 MG tablet Take 50 mg by mouth daily. 06/15/23  Yes [provider]  albuterol (VENTOLIN HFA) 108 (90 Base) MCG/ACT inhaler Inhale 2 puffs into the lungs every 6 (six) hours as needed for wheezing or shortness of breath. Patient not taking: Reported on 09/20/2023 02/10/21   Nita Sickle, MD  Continuous Glucose Sensor (DEXCOM G7 SENSOR) MISC use as directed for continuous glucose monitoring 05/27/23   Thapa, [place], MD  Insulin Disposable Pump (OMNIPOD 5 DEXG7G6 PODS GEN 5) MISC use as directed change every 3 (three) days. 04/29/23   Thapa, [place], MD  Insulin Pen Needle (PEN NEEDLES 3/16") 31G X 5 MM MISC Use 4 times a day 03/04/23   Thapa, [place], MD  Insulin Syringe-Needle U-100 (INSULIN SYRINGE 1CC/31GX5/16") 31G X 5/16" 1 ML MISC 1 Device by Does not apply route daily. 05/05/23   Thapa, [place], MD      VITAL SIGNS:  Blood pressure 96/60, pulse 80, temperature 97.9 F (36.6 C), temperature source Oral, resp. rate 20, height 5\' 11"  (1.803 m), SpO2 100%.  PHYSICAL EXAMINATION:  Physical Exam  GENERAL: Acutely ill 33 y.o.-year-old African-American male patient lying in the bed with no acute distress.  EYES: Pupils equal, round, reactive to light and accommodation. No scleral icterus. Extraocular muscles intact.  HEENT: Head atraumatic, normocephalic. Oropharynx with dry mucous membrane and tongue and nasopharynx clear.  NECK:  Supple, no jugular venous distention. No thyroid enlargement, no tenderness.  LUNGS: Normal breath sounds bilaterally, no wheezing, rales,rhonchi or crepitation. No use of accessory muscles of respiration.  CARDIOVASCULAR: Regular rate and rhythm, S1, S2 normal. No murmurs, rubs, or gallops.  ABDOMEN: Soft, nondistended, nontender. Bowel sounds present. No organomegaly or mass.  EXTREMITIES: No pedal edema, cyanosis, or clubbing.  NEUROLOGIC:  Cranial nerves II through XII are intact. Muscle strength 5/5 in all extremities. Sensation intact. Gait not checked.  PSYCHIATRIC: The patient is alert and oriented x 3.  Normal affect and good eye contact. SKIN: No obvious rash, lesion, or ulcer.   LABORATORY PANEL:   CBC Recent Labs  Lab 09/20/23 0407  WBC 6.8  HGB 15.4  HCT 43.3  PLT 300   ------------------------------------------------------------------------------------------------------------------  Chemistries  Recent Labs  Lab 09/20/23 0020  NA 132*  K 4.4  CL 98  CO2 12*  GLUCOSE 339*  BUN 15  CREATININE 1.22  CALCIUM 10.2   ------------------------------------------------------------------------------------------------------------------  Cardiac Enzymes No results for input(s): "TROPONINI" in the last 168 hours. ------------------------------------------------------------------------------------------------------------------  RADIOLOGY:  DG Chest 2 View Result Date: 09/20/2023 CLINICAL DATA:  Shortness of breath. EXAM: CHEST - 2 VIEW COMPARISON:  Portable chest 02/10/2023. FINDINGS: The heart size and mediastinal contours are within normal limits. Both lungs are clear. The visualized  skeletal structures are unremarkable. IMPRESSION: No active cardiopulmonary disease.  Stable chest. Electronically Signed   By: Almira Bar M.D.   On: 09/20/2023 00:58      IMPRESSION AND PLAN:  Assessment and Plan: DKA (diabetic ketoacidosis) (HCC) - The patient will be admitted to a stepdown bed. - We will continue the on IV insulin drip per EndoTool DKA protocol. - The patient will be aggressively hydrated with IV normal saline. - Will follow serial BMPs.   Depression - We will continue Zoloft.  Peripheral neuropathy Will continue Neurontin and Cymbalta.       DVT prophylaxis: Lovenox.  Advanced Care Planning:  Code Status: full code.  Family Communication:  The plan of care was discussed in details with  the patient (and family). I answered all questions. The patient agreed to proceed with the above mentioned plan. Further management will depend upon hospital course. Disposition Plan: Back to previous home environment Consults called: none.  All the records are reviewed and case discussed with ED provider.  Status is: Inpatient    At the time of the admission, it appears that the appropriate admission status for this patient is inpatient.  This is judged to be reasonable and necessary in order to provide the required intensity of service to ensure the patient's safety given the presenting symptoms, physical exam findings and initial radiographic and laboratory data in the context of comorbid conditions.  The patient requires inpatient status due to high intensity of service, high risk of further deterioration and high frequency of surveillance required.  I certify that at the time of admission, it is my clinical judgment that the patient will require inpatient hospital care extending more than 2 midnights.                            Dispo: The patient is from: Home              Anticipated d/c is to: Home              Patient currently is not medically stable to d/c.              Difficult to place patient: No Authorized and performed by: Valente David, MD Total critical care time:    45    minutes. Due to a high probability of clinically significant, life-threatening deterioration, the patient required my highest level of preparedness to intervene emergently and I personally spent this critical care time directly and personally managing the patient.  This critical care time included obtaining a history, examining the patient, pulse oximetry, ordering and review of studies, arranging urgent treatment with development of management plan, evaluation of patient's response to treatment, frequent reassessment, and discussions with other providers. This critical care time was performed to assess and  manage the high probability of imminent, life-threatening deterioration that could result in multiorgan failure.  It was exclusive of separately billable procedures and treating other patients and teaching time.   Hannah Beat M.D on 09/20/2023 at 4:52 AM  [facility]   From 7 PM-7 AM, contact night-coverage [email or web address]  CC: Primary care physician; Marcine Matar, MD

## 2023-09-20 NOTE — ED Provider Notes (Signed)
 [facility] Provider Note    Event Date/Time   First MD Initiated Contact with Patient 09/20/23 0121     (approximate)   History   Medication Refill and Shortness of Breath   HPI  Jerome Murphy is a 33 y.o. male with history of insulin-dependent diabetes, seizures, asthma, narcolepsy who presents to the emergency department stating that he is not feeling well.  States he has had nausea and vomiting here in the waiting room and has had shortness of breath.  No chest pain.  No fevers or cough.  No abdominal discomfort.  He has been without his insulin for several days.   History provided by patient.    Past Medical History:  Diagnosis Date   Asthma    Diabetes mellitus type I (HCC)    Diabetes mellitus without complication (HCC)    DKA (diabetic ketoacidosis) (HCC) 05/18/2022   History of gallstones    Narcolepsy    Seizures (HCC)     Past Surgical History:  Procedure Laterality Date   CIRCUMCISION     HERNIA REPAIR     TONSILLECTOMY      MEDICATIONS:  Prior to Admission medications   Medication Sig Start Date End Date Taking? Authorizing Provider  albuterol (VENTOLIN HFA) 108 (90 Base) MCG/ACT inhaler Inhale 2 puffs into the lungs every 6 (six) hours as needed for wheezing or shortness of breath. 02/10/21   Nita Sickle, MD  Continuous Glucose Sensor (DEXCOM G7 SENSOR) MISC use as directed for continuous glucose monitoring 05/27/23   Thapa, [place], MD  DULoxetine (CYMBALTA) 30 MG capsule Take 1 capsule (30 mg total) by mouth in the morning AND 2 capsules (60 mg total) at bedtime. 08/27/23   Bahraini, Sarah A  gabapentin (NEURONTIN) 300 MG capsule Take 1 capsule (300 mg total) by mouth 3 (three) times daily. 08/25/23   Marcine Matar, MD  hydrOXYzine (ATARAX) 25 MG tablet Take 1-2 tablets (25-50 mg total) by mouth 2 (two) times daily as needed (panic attacks or sleep). 08/27/23 11/25/23  B[place]raini, Sarah A  insulin aspart (NOVOLOG)  100 UNIT/ML injection Use via insulin pump 60 units/day. 05/04/23   Thapa, [place], MD  Insulin Disposable Pump (OMNIPOD 5 DEXG7G6 INTRO GEN 5) KIT 1 each by Does not apply route as needed. 07/02/23   Thapa, [place], MD  Insulin Disposable Pump (OMNIPOD 5 DEXG7G6 PODS GEN 5) MISC use as directed change every 3 (three) days. 04/29/23   Thapa, [place], MD  Insulin Pen Needle (PEN NEEDLES 3/16") 31G X 5 MM MISC Use 4 times a day 03/04/23   Thapa, [place], MD  Insulin Syringe-Needle U-100 (INSULIN SYRINGE 1CC/31GX5/16") 31G X 5/16" 1 ML MISC 1 Device by Does not apply route daily. 05/05/23   Thapa, [place], MD    Physical Exam   Triage Vital Signs: ED Triage Vitals  Encounter Vitals Group     BP 09/20/23 0020 (!) 119/93     Systolic BP Percentile --      Diastolic BP Percentile --      Pulse Rate 09/20/23 0020 (!) 130     Resp 09/20/23 0020 (!) 22     Temp 09/20/23 0020 97.9 F (36.6 C)     Temp Source 09/20/23 0020 Oral     SpO2 09/20/23 0020 100 %     Weight --      Height 09/20/23 0018 5\' 11"  (1.803 m)     Head Circumference --      Peak  Flow --      Pain Score 09/20/23 0018 0     Pain Loc --      Pain Education --      Exclude from Growth Chart --     Most recent vital signs: Vitals:   09/20/23 0630 09/20/23 0648  BP: 108/65   Pulse: 95   Resp: 17   Temp:  98.6 F (37 C)  SpO2: 100%     CONSTITUTIONAL: Alert, responds appropriately to questions.  In no distress HEAD: Normocephalic, atraumatic EYES: Conjunctivae clear, pupils appear equal, sclera nonicteric ENT: normal nose; dry mucous membranes NECK: Supple, normal ROM CARD: Regular and tachycardic; S1 and S2 appreciated RESP: Normal chest excursion without splinting or tachypnea; breath sounds clear and equal bilaterally; no wheezes, no rhonchi, no rales, no hypoxia or respiratory distress, speaking full sentences ABD/GI: Non-distended; soft, non-tender, no rebound, no guarding, no peritoneal signs BACK: The back appears  normal EXT: Normal ROM in all joints; no deformity noted, no edema SKIN: Normal color for age and race; warm; no rash on exposed skin NEURO: Moves all extremities equally, normal speech PSYCH: The patient's mood and manner are appropriate.   ED Results / Procedures / Treatments   LABS: (all labs ordered are listed, but only abnormal results are displayed) Labs Reviewed  BASIC METABOLIC PANEL - Abnormal; Notable for the following components:      Result Value   Sodium 132 (*)    CO2 12 (*)    Glucose, Bld 339 (*)    Anion gap 22 (*)    All other components within normal limits  CBC - Abnormal; Notable for the following components:   Hemoglobin 17.4 (*)    MCH 34.3 (*)    RDW 11.4 (*)    All other components within normal limits  BLOOD GAS, VENOUS - Abnormal; Notable for the following components:   pH, Ven 7.21 (*)    pCO2, Ven 21 (*)    pO2, Ven 84 (*)    Bicarbonate 8.4 (*)    Acid-base deficit 17.5 (*)    All other components within normal limits  BETA-HYDROXYBUTYRIC ACID - Abnormal; Notable for the following components:   Beta-Hydroxybutyric Acid >8.00 (*)    All other components within normal limits  URINALYSIS, ROUTINE W REFLEX MICROSCOPIC - Abnormal; Notable for the following components:   Color, Urine STRAW (*)    APPearance CLEAR (*)    Glucose, UA >=500 (*)    Ketones, ur 80 (*)    Protein, ur 30 (*)    All other components within normal limits  BASIC METABOLIC PANEL - Abnormal; Notable for the following components:   Sodium 132 (*)    Potassium 5.4 (*)    CO2 9 (*)    Glucose, Bld 321 (*)    Anion gap 19 (*)    All other components within normal limits  CBC - Abnormal; Notable for the following components:   MCH 34.3 (*)    RDW 11.4 (*)    All other components within normal limits  CBG MONITORING, ED - Abnormal; Notable for the following components:   Glucose-Capillary 306 (*)    All other components within normal limits  CBG MONITORING, ED - Abnormal;  Notable for the following components:   Glucose-Capillary 225 (*)    All other components within normal limits  CBG MONITORING, ED - Abnormal; Notable for the following components:   Glucose-Capillary 203 (*)    All other components within normal  limits  BETA-HYDROXYBUTYRIC ACID  TROPONIN I (HIGH SENSITIVITY)     EKG:  EKG Interpretation Date/Time:  Monday September 20 2023 00:26:53 EDT Ventricular Rate:  116 PR Interval:  122 QRS Duration:  84 QT Interval:  308 QTC Calculation: 428 R Axis:   76  Text Interpretation: Sinus tachycardia Right atrial enlargement ST & T wave abnormality, consider inferior ischemia Abnormal ECG When compared with ECG of 10-Feb-2023 06:45, PREVIOUS ECG IS PRESENT Confirmed by Rochele Raring [phone]) on 09/20/2023 1:35:53 AM         RADIOLOGY: My personal review and interpretation of imaging: Chest x-ray clear.  I have personally reviewed all radiology reports.   DG Chest 2 View Result Date: 09/20/2023 CLINICAL DATA:  Shortness of breath. EXAM: CHEST - 2 VIEW COMPARISON:  Portable chest 02/10/2023. FINDINGS: The heart size and mediastinal contours are within normal limits. Both lungs are clear. The visualized skeletal structures are unremarkable. IMPRESSION: No active cardiopulmonary disease.  Stable chest. Electronically Signed   By: Almira Bar M.D.   On: 09/20/2023 00:58     PROCEDURES:  Critical Care performed: Yes, see critical care procedure note(s)   CRITICAL CARE Performed by: Baxter Hire Adriane Gabbert   Total critical care time: 40 minutes  Critical care time was exclusive of separately billable procedures and treating other patients.  Critical care was necessary to treat or prevent imminent or life-threatening deterioration.  Critical care was time spent personally by me on the following activities: development of treatment plan with patient and/or surrogate as well as nursing, discussions with consultants, evaluation of patient's response to  treatment, examination of patient, obtaining history from patient or surrogate, ordering and performing treatments and interventions, ordering and review of laboratory studies, ordering and review of radiographic studies, pulse oximetry and re-evaluation of patient's condition.   Marland Kitchen1-3 Lead EKG Interpretation  Performed by: Kamilo Och, Layla Maw, DO Authorized by: Marua Qin, Layla Maw, DO     Interpretation: abnormal     ECG rate:  130   ECG rate assessment: tachycardic     Rhythm: sinus tachycardia     Ectopy: none     Conduction: normal       IMPRESSION / MDM / ASSESSMENT AND PLAN / ED COURSE  I reviewed the triage vital signs and the nursing notes.    Patient here with shortness of breath, vomiting, tachycardia.  Has been out of his insulin for several days.  The patient is on the cardiac monitor to evaluate for evidence of arrhythmia and/or significant heart rate changes.   DIFFERENTIAL DIAGNOSIS (includes but not limited to):   DKA, HHS, pneumonia, viral URI, PE, ACS, dehydration, anemia, electrolyte derangement   Patient's presentation is most consistent with acute presentation with potential threat to life or bodily function.   PLAN: Workup initiated from triage.  Patient's blood glucose in the 300s.  Bicarb of 12, anion gap of 22.  Patient is in DKA.  Will start IV fluids and IV insulin.  Will obtain VBG, beta hydroxybutyric acid level.  Chest x-ray reviewed and interpreted by myself and the radiologist and is clear.  He is tachycardic, tachypneic here but low suspicion for PE given no risk factors.  I suspect that this is likely secondary to compensation from his metabolic acidosis.   MEDICATIONS GIVEN IN ED: Medications  insulin regular, human (MYXREDLIN) 100 units/ 100 mL infusion (4.6 Units/hr Intravenous Rate/Dose Change 09/20/23 0633)  lactated ringers infusion (0 mLs Intravenous Stopped 09/20/23 0535)  dextrose 5 % in lactated  ringers infusion ( Intravenous New Bag/Given  09/20/23 0532)  dextrose 50 % solution 0-50 mL (has no administration in time range)  DULoxetine (CYMBALTA) DR capsule 30 mg (has no administration in time range)    And  DULoxetine (CYMBALTA) DR capsule 60 mg (has no administration in time range)  hydrOXYzine (ATARAX) tablet 25-50 mg (has no administration in time range)  sertraline (ZOLOFT) tablet 50 mg (has no administration in time range)  gabapentin (NEURONTIN) capsule 300 mg (has no administration in time range)  enoxaparin (LOVENOX) injection 40 mg (has no administration in time range)  acetaminophen (TYLENOL) tablet 650 mg (has no administration in time range)    Or  acetaminophen (TYLENOL) suppository 650 mg (has no administration in time range)  traZODone (DESYREL) tablet 25 mg (has no administration in time range)  magnesium hydroxide (MILK OF MAGNESIA) suspension 30 mL (has no administration in time range)  ondansetron (ZOFRAN) tablet 4 mg (has no administration in time range)    Or  ondansetron (ZOFRAN) injection 4 mg (has no administration in time range)  acetaminophen (TYLENOL) tablet 650 mg (has no administration in time range)  alum & mag hydroxide-simeth (MAALOX/MYLANTA) 200-200-20 MG/5ML suspension 30 mL (30 mLs Oral Given 09/20/23 0340)  lactated ringers bolus 2,000 mL (0 mLs Intravenous Stopped 09/20/23 0336)  ondansetron (ZOFRAN) injection 4 mg (4 mg Intravenous Given 09/20/23 0158)  potassium chloride 10 mEq in 100 mL IVPB (0 mEq Intravenous Stopped 09/20/23 0642)     ED COURSE: Patient's pH is 7.21.  Bicarb on his VBG is 8.4.  Urine shows large ketones.   CONSULTS:  Consulted and discussed patient's case with hospitalist, Dr. Arville Care.  I have recommended admission and consulting physician agrees and will place admission orders.  Patient (and family if present) agree with this plan.   I reviewed all nursing notes, vitals, pertinent previous records.  All labs, EKGs, imaging ordered have been independently reviewed and  interpreted by myself.    OUTSIDE RECORDS REVIEWED: Reviewed last PCP note on 08/17/2023.       FINAL CLINICAL IMPRESSION(S) / ED DIAGNOSES   Final diagnoses:  Type 1 diabetes mellitus with ketoacidosis without coma (HCC)     Rx / DC Orders   ED Discharge Orders     None        Note:  This document was prepared using Dragon voice recognition software and may include unintentional dictation errors.   Malyk Girouard, Layla Maw, DO 09/20/23 [phone]

## 2023-09-20 NOTE — Inpatient Diabetes Management (Addendum)
 Inpatient Diabetes Program Recommendations  AACE/ADA: New Consensus Statement on Inpatient Glycemic Control (2015)  Target Ranges:  Prepandial:   less than 140 mg/dL      Peak postprandial:   less than 180 mg/dL (1-2 hours)      Critically ill patients:  140 - 180 mg/dL    Latest Reference Range & Units 04/29/23 11:31  Hemoglobin A1C 4.0 - 5.6 % -  14.3 ! Pend    Latest Reference Range & Units 09/20/23 00:20  Beta-Hydroxybutyric Acid 0.05 - 0.27 mmol/L >8.00 (H)  (H): Data is abnormally high  Latest Reference Range & Units 09/20/23 04:21 09/20/23 05:24 09/20/23 06:30  Glucose-Capillary 70 - 99 mg/dL 161 (H)  IV Insulin Drip Started 225 (H) 203 (H)  (H): Data is abnormally high  Admit with: DKA Pt has been without insulin for days??  History: Type 1 diabetes  Home DM Meds: Dexcom G7 CGM       OmniPod Insulin Pump with Novolog  Current Orders: IV Insulin Drip    Beta Hydroxybutyric acid level still elevated this AM  BMETs ordered this AM    ENDO: Dr. Erroll Murphy with [facility] Last seen in Office 05/27/2023 Started OmniPod Insulin Pump October 2024 Pump settings: Basal rate: 12 AM - 0.7 units/hr 7.0AM - 1.0 10 AM -  0.7 Total Basal per 24H= 17.7 units   Correction factor : 1:50   Carb ratio : 1:1 (small meal carb count 2, medium meal carb count 4 and large meals carb count 6).  He does not know carb counting.   Target 130-160 Active insulin time : 4 hr Patient reports his mother is helping with entering carbs for meal boluses.  He is not sure how he is doing.  Was advised as ( small meal carb count 2, medium meal carb count 4 and large meals carb count 6). He does not know carb counting. Home glucose testing: Dexcom G7 and check as needed    Addendum 12:45pm--Met w/ pt down in the ED.  Pt told me he removed his insulin pod/pump.  Stated he does not like his insulin pump because he tends to run out of insulin too frequently.  I wanted to see his pump settings in  his phone (pt uses his phone to control the pump with the Omni pod app) but his phone was not charged--Told me family will bring charger for him and that I can look at his phone tomorrow.  I asked pt how he calculates boluses--Pt stated the Dexcom glucose sensor gives his pump the glucose reading and that he has to input the amount of carbohydrates he is eating--Stated he uses the calorie king app and food labels to try to calculate his carbs and then enters the carbs in his pump.  I am concerned that this may be why pt is running out of insulin with his pump too soon?  His Carbohydrate ratio is set at 1:1 which means he gets 1 unit for every 1 gram carbohydrate.  I have called pt's ENDO office and left message for return call to review pt's insulin pump settings and to see how I can further help pt.  In the meantime, pt will transition to basal/bolus regimen in the hospital and then hopefully he can resume his insulin pump when he goes home.  Pt aware of the transition to SQ Insulin pan and OK with this.  I strongly encouraged pt to have follow up visit with the insulin pump trainer at his  ENDO office so he can have further learning with his insulin pump.       --Will follow patient during hospitalization--  Jerome Finland RN, MSN, CDCES Diabetes Coordinator Inpatient Glycemic Control Team Team Pager: [phone] (8a-5p)

## 2023-09-20 NOTE — ED Notes (Signed)
 Insulin drip to be stopped at 1315 per VO of DO. LA insulin given at 1215.

## 2023-09-20 NOTE — Assessment & Plan Note (Signed)
 Will continue Neurontin and Cymbalta.

## 2023-09-20 NOTE — ED Notes (Signed)
 Update given to pt's mother with pt's permission.

## 2023-09-20 NOTE — Assessment & Plan Note (Signed)
-   We will continue Zoloft 

## 2023-09-21 ENCOUNTER — Telehealth: Payer: Self-pay | Admitting: Nutrition

## 2023-09-21 ENCOUNTER — Other Ambulatory Visit: Payer: Self-pay

## 2023-09-21 DIAGNOSIS — E1010 Type 1 diabetes mellitus with ketoacidosis without coma: Secondary | ICD-10-CM | POA: Diagnosis not present

## 2023-09-21 LAB — BASIC METABOLIC PANEL
Anion gap: 10 (ref 5–15)
BUN: 14 mg/dL (ref 6–20)
CO2: 20 mmol/L — ABNORMAL LOW (ref 22–32)
Calcium: 8.5 mg/dL — ABNORMAL LOW (ref 8.9–10.3)
Chloride: 102 mmol/L (ref 98–111)
Creatinine, Ser: 0.81 mg/dL (ref 0.61–1.24)
GFR, Estimated: >60 mL/min (ref >60)
Glucose, Bld: 256 mg/dL — ABNORMAL HIGH (ref 70–99)
Potassium: 3.9 mmol/L (ref 3.5–5.1)
Sodium: 132 mmol/L — ABNORMAL LOW (ref 135–145)

## 2023-09-21 LAB — BLOOD GAS, VENOUS
Acid-base deficit: 17.5 mmol/L — ABNORMAL HIGH (ref 0.0–2.0)
Bicarbonate: 8.4 mmol/L — ABNORMAL LOW (ref 20.0–28.0)
O2 Saturation: 95.8 %
Patient temperature: 37
pCO2, Ven: 21 mmHg — ABNORMAL LOW (ref 44–60)
pH, Ven: 7.21 — ABNORMAL LOW (ref 7.25–7.43)
pO2, Ven: 84 mmHg — ABNORMAL HIGH (ref 32–45)

## 2023-09-21 LAB — GLUCOSE, CAPILLARY
Glucose-Capillary: 276 mg/dL — ABNORMAL HIGH (ref 70–99)
Glucose-Capillary: 90 mg/dL (ref 70–99)

## 2023-09-21 MED ORDER — INSULIN GLARGINE-YFGN 100 UNIT/ML ~~LOC~~ SOLN
20.0000 [IU] | SUBCUTANEOUS | Status: DC
  Filled 2023-09-21: qty 0.2

## 2023-09-21 MED ORDER — INSULIN ASPART 100 UNIT/ML IJ SOLN
INTRAMUSCULAR | 1 refills | Status: DC
  Filled 2023-09-21: qty 20, 33d supply, fill #0
  Filled 2023-10-14 – 2023-10-18 (×2): qty 20, 33d supply, fill #1

## 2023-09-21 MED ORDER — OMNIPOD 5 DEXG7G6 PODS GEN 5 MISC
1.0000 | 3 refills | Status: AC
  Filled 2023-09-21 – 2023-10-14 (×2): qty 10, 30d supply, fill #0
  Filled 2023-11-23 (×2): qty 10, 30d supply, fill #1
  Filled 2023-12-22: qty 10, 30d supply, fill #2
  Filled 2024-03-10 – 2024-04-12 (×2): qty 10, 30d supply, fill #3
  Filled 2024-05-22: qty 10, 30d supply, fill #4
  Filled 2024-07-04: qty 10, 30d supply, fill #0

## 2023-09-21 MED ORDER — INSULIN ASPART 100 UNIT/ML IJ SOLN
8.0000 [IU] | Freq: Three times a day (TID) | INTRAMUSCULAR | Status: DC
  Administered 2023-09-21: 8 [IU] via SUBCUTANEOUS
  Filled 2023-09-21: qty 1

## 2023-09-21 MED ORDER — OMNIPOD 5 DEXG7G6 INTRO GEN 5 KIT
1.0000 | PACK | 0 refills | Status: AC | PRN
  Filled 2023-09-21: qty 1, 365d supply, fill #0

## 2023-09-21 NOTE — Inpatient Diabetes Management (Addendum)
 Inpatient Diabetes Program Recommendations  AACE/ADA: New Consensus Statement on Inpatient Glycemic Control (2015)  Target Ranges:  Prepandial:   less than 140 mg/dL      Peak postprandial:   less than 180 mg/dL (1-2 hours)      Critically ill patients:  140 - 180 mg/dL    Latest Reference Range & Units 09/20/23 08:45 09/20/23 09:55 09/20/23 11:10 09/20/23 12:14 09/20/23 13:10 09/20/23 21:22  Glucose-Capillary 70 - 99 mg/dL 161 (H)  IV Insulin Drip Running 157 (H) 129 (H) 113 (H)  15 units Semglee 135 (H)  IV Insulin Drip Stopped  2 units Novolog  278 (H)  3 units Novolog   (H): Data is abnormally high  Latest Reference Range & Units 09/21/23 07:31  Glucose-Capillary 70 - 99 mg/dL 096 (H)  (H): Data is abnormally high   History: Type 1 diabetes   Home DM Meds: Dexcom G7 CGM                             OmniPod Insulin Pump with Novolog   Current Orders: Novolog Moderate Correction Scale/ SSI (0-15 units) TID AC + HS     Novolog 4 units TID with meals    I have called pt's ENDO office and left message for return call to review pt's insulin pump settings and to see how I can further help pt   If pt has charged his cell phone and has all his insulin pump supplies with him, he could restart his insulin pump around 12pm today (the Semglee insulin we gave him yest will run out by then)  He needs to return to ENDO office for further pump training    Addendum 9am--Spoke with Lida Spagnola from ENDO office this AM.  We reviewed pt's insulin pump settings and I told Bonita Quin that I suspect pt is carb counting and inputting larger amounts of carbs than he is supposed to.  Bonita Quin told me pt's insulin pump is set with very basic settings and that pt is NOT to input more than the "8" when he gives food coverage.  Pt should be dosing 2 for small carb meal, 4 for medium carb meal, 6 for large carb meal, and 8 for very large carb meal.  Bonita Quin told me the ENDO office will cal pt today or  tomorrow to schedule a follow up appt and asked me to reinforce the above info about covering meals with the pump with pt.   Addendum 10:30am--Met w/ pt again today.  Pt does not have the PDM device that is used to control his insulin pump so he will not be able to restart the pump until he goes home.  I asked pt to clarify how he is covering meals at home with his pump.  He told me sometimes he'll put in "15", sometimes "30" and sometimes more if his glucose stays high.  We talked about how his insulin pump is set with very basic settings and that pt should never input more than "8" into his pump when giving food coverage.  We reviewed the info Bonita Quin gave me and I tried to educate pt about the importance of following the instructions he has been given until he follows back up with Dr. Erroll Luna and the pump educator.  Pt reminded me he has been running out of insulin and I explained to pt that the way his insulin pump is set he is getting way to much  carb coverage when he puts in the actual amount of carbs.  Needs to go back to the originally way he was taught until his pump settings can be adjusted by the ENDO team.  Pt did tell me he is having lots of HYPOglycemia and this is likely due to giving too much with meals.  Pt did tell me he ran out of insulin last Fri and did not take any insulin from Fri thru Sunday--This is the cause of the DKA.  He has all pump supplies at home and can restart the pump when he goes home.  I did stress to pt to please answer his phone with the ENDO office calls and that if he does not hear back from them within 2 days to please call them back.  Pt stated understanding.     ENDO: Dr. Erroll Luna with [facility] Last seen in Office 05/27/2023 Started OmniPod Insulin Pump October 2024 Pump settings: Basal rate: 12 AM - 0.7 units/hr 7.0AM - 1.0 10 AM -  0.7 Total Basal per 24H= 17.7 units   Correction factor : 1:50   Carb ratio : 1:1 (small meal carb count 2, medium meal carb  count 4 and large meals carb count 6).  He does not know carb counting.   Target 130-160 Active insulin time : 4 hr Patient reports his mother is helping with entering carbs for meal boluses.  He is not sure how he is doing.  Was advised as ( small meal carb count 2, medium meal carb count 4 and large meals carb count 6). He does not know carb counting. Home glucose testing: Dexcom G7 and check as needed     --Will follow patient during hospitalization--  Ambrose Finland RN, MSN, CDCES Diabetes Coordinator Inpatient Glycemic Control Team Team Pager: [phone] (8a-5p)

## 2023-09-21 NOTE — Telephone Encounter (Signed)
 LVM to call her concerning how patient is taking his meal time insulin.  She says that when she spoke to him, he reported that he is counting carbs some times and other times putting in units of insulin.  ??? Tried to return her call, and had to leave a message to call e back.

## 2023-09-21 NOTE — Discharge Summary (Signed)
 Physician Discharge Summary   Patient: Jerome Murphy MRN: [id number]  DOB: 28-Nov-1990   Admit:     Date of Admission: 09/20/2023 Admitted from: home   Discharge: Date of discharge: 09/21/23 Disposition: Home Condition at discharge: good  CODE STATUS: FULL CODE     Discharge Physician: Sunnie Nielsen, DO [facility]     PCP: Marcine Matar, MD  Recommendations for Outpatient Follow-up:  Follow up with PCP Marcine Matar, MD in 1-2 weeks Folow up wASAP w/ endorinology     Discharge Instructions     Diet Carb Modified   Complete by: As directed    Increase activity slowly   Complete by: As directed          Discharge Diagnoses: Active Problems:   DKA (diabetic ketoacidosis) (HCC)   Peripheral neuropathy   Depression    Hospital course / significant events:   HPI: Jerome Murphy is a 33 y.o. African-American male with medical history significant for asthma, type 1 diabetes mellitus with history of DKA,Seizure disorder and narcolepsy, who presented to the emergency room with acute onset of with associated generalized weakness, flulike symptoms w/ N/V, assoc w/ polyuria/polydipsia.   03/10: admitted to hospitalist service for DKA. Off insulin drip this afternoon 03/11: glc stabilized, d/c to follow w/ endocrinology - regimen confirmed by diabetes nurse educator.    Consultants:  none  Procedures/Surgeries: none      ASSESSMENT & PLAN:   DKA (diabetic ketoacidosis)  Resolved Follow w/ endocrine ASAP Resume insulin pump Diabetes educator has worked extensively w/ patient   Depression continue Zoloft.   Peripheral neuropathy continue Neurontin and Cymbalta.             Discharge Instructions  Allergies as of 09/21/2023       Reactions   Robitussin [guaifenesin] Hives, Itching        Medication List     TAKE these medications    albuterol 108 (90 Base) MCG/ACT inhaler Commonly  known as: VENTOLIN HFA Inhale 2 puffs into the lungs every 6 (six) hours as needed for wheezing or shortness of breath.   Dexcom G7 Sensor Misc use as directed for continuous glucose monitoring   DULoxetine 30 MG capsule Commonly known as: Cymbalta Take 1 capsule (30 mg total) by mouth in the morning AND 2 capsules (60 mg total) at bedtime.   gabapentin 300 MG capsule Commonly known as: NEURONTIN Take 1 capsule (300 mg total) by mouth 3 (three) times daily.   hydrOXYzine 25 MG tablet Commonly known as: ATARAX Take 1-2 tablets (25-50 mg total) by mouth 2 (two) times daily as needed (panic attacks or sleep).   insulin aspart 100 UNIT/ML injection Commonly known as: novoLOG Use via insulin pump 60 units/day.   Omnipod 5 DexG7G6 Pods Gen 5 Misc use as directed change every 3 (three) days.   Omnipod 5 DexG7G6 Intro Gen 5 Kit Use as directed.   sertraline 50 MG tablet Commonly known as: ZOLOFT Take 50 mg by mouth daily.   TRUEplus 5-Bevel Pen Needles 31G X 5 MM Misc Generic drug: Insulin Pen Needle Use 4 times a day   TRUEplus Insulin Syringe 31G X 5/16" 1 ML Misc Generic drug: Insulin Syringe-Needle U-100 1 Device by Does not apply route daily.          Allergies  Allergen Reactions   Robitussin [Guaifenesin] Hives and Itching     Subjective: pt reports feeling well today, no concerns, eager for  dc home. Denies CP/SOB. Tolerating diet ambulating    Discharge Exam: BP 99/64 (BP Location: Left Arm)   Pulse 66   Temp 98 F (36.7 C)   Resp 16   Ht 5\' 11"  (1.803 m)   SpO2 99%   BMI 21.76 kg/m  General: Pt is alert, awake, not in acute distress Cardiovascular: RRR, S1/S2 +, no rubs, no gallops Respiratory: CTA bilaterally, no wheezing, no rhonchi Abdominal: Soft, NT, ND, bowel sounds + Extremities: no edema, no cyanosis     The results of significant diagnostics from this hospitalization (including imaging, microbiology, ancillary and laboratory) are  listed below for reference.     Microbiology: No results found for this or any previous visit (from the past 240 hours).   Labs: BNP (last 3 results) No results for input(s): "BNP" in the last 8760 hours. Basic Metabolic Panel: Recent Labs  Lab 09/20/23 0020 09/20/23 0407 09/20/23 0753 09/21/23 0510  NA 132* 132* 134* 132*  K 4.4 5.4* 5.1 3.9  CL 98 104 109 102  CO2 12* 9* 16* 20*  GLUCOSE 339* 321* 181* 256*  BUN 15 14 13 14   CREATININE 1.22 1.07 0.82 0.81  CALCIUM 10.2 9.3 8.7* 8.5*   Liver Function Tests: No results for input(s): "AST", "ALT", "ALKPHOS", "BILITOT", "PROT", "ALBUMIN" in the last 168 hours. No results for input(s): "LIPASE", "AMYLASE" in the last 168 hours. No results for input(s): "AMMONIA" in the last 168 hours. CBC: Recent Labs  Lab 09/20/23 0020 09/20/23 0407  WBC 8.2 6.8  HGB 17.4* 15.4  HCT 49.0 43.3  MCV 96.5 96.4  PLT 335 300   Cardiac Enzymes: No results for input(s): "CKTOTAL", "CKMB", "CKMBINDEX", "TROPONINI" in the last 168 hours. BNP: Invalid input(s): "POCBNP" CBG: Recent Labs  Lab 09/20/23 1214 09/20/23 1310 09/20/23 2122 09/21/23 0731 09/21/23 1121  GLUCAP 113* 135* 278* 276* 90   D-Dimer No results for input(s): "DDIMER" in the last 72 hours. Hgb A1c No results for input(s): "HGBA1C" in the last 72 hours. Lipid Profile No results for input(s): "CHOL", "HDL", "LDLCALC", "TRIG", "CHOLHDL", "LDLDIRECT" in the last 72 hours. Thyroid function studies No results for input(s): "TSH", "T4TOTAL", "T3FREE", "THYROIDAB" in the last 72 hours.  Invalid input(s): "FREET3" Anemia work up No results for input(s): "VITAMINB12", "FOLATE", "FERRITIN", "TIBC", "IRON", "RETICCTPCT" in the last 72 hours. Urinalysis    Component Value Date/Time   COLORURINE STRAW (A) 09/20/2023 0407   APPEARANCEUR CLEAR (A) 09/20/2023 0407   LABSPEC 1.018 09/20/2023 0407   PHURINE 5.0 09/20/2023 0407   GLUCOSEU >=500 (A) 09/20/2023 0407   HGBUR  NEGATIVE 09/20/2023 0407   BILIRUBINUR NEGATIVE 09/20/2023 0407   KETONESUR 80 (A) 09/20/2023 0407   PROTEINUR 30 (A) 09/20/2023 0407   UROBILINOGEN 1.0 03/30/2015 1850   NITRITE NEGATIVE 09/20/2023 0407   LEUKOCYTESUR NEGATIVE 09/20/2023 0407   Sepsis Labs Recent Labs  Lab 09/20/23 0020 09/20/23 0407  WBC 8.2 6.8   Microbiology No results found for this or any previous visit (from the past 240 hours). Imaging DG Chest 2 View Result Date: 09/20/2023 CLINICAL DATA:  Shortness of breath. EXAM: CHEST - 2 VIEW COMPARISON:  Portable chest 02/10/2023. FINDINGS: The heart size and mediastinal contours are within normal limits. Both lungs are clear. The visualized skeletal structures are unremarkable. IMPRESSION: No active cardiopulmonary disease.  Stable chest. Electronically Signed   By: Almira Bar M.D.   On: 09/20/2023 00:58      Time coordinating discharge: over 30 minutes  SIGNED:  Sunnie Nielsen DO [facility]

## 2023-09-22 ENCOUNTER — Ambulatory Visit: Admitting: Endocrinology

## 2023-09-22 ENCOUNTER — Telehealth: Payer: Self-pay

## 2023-09-22 NOTE — Transitions of Care (Post Inpatient/ED Visit) (Signed)
   09/22/2023  Name: Jerome Murphy MRN: [id number] DOB: [birth year]  Today's TOC FU Call Status: Today's TOC FU Call Status:: Successful TOC FU Call Completed TOC FU Call Complete Date: 09/22/23 Patient's Name and Date of Birth confirmed.  Transition Care Management Follow-up Telephone Call Date of Discharge: 09/21/23 Discharge Facility: [facility]) Type of Discharge: Inpatient Admission Primary Inpatient Discharge Diagnosis:: DKA How have you been since you were released from the hospital?: Better Any questions or concerns?: No  Items Reviewed: Did you receive and understand the discharge instructions provided?: Yes Medications obtained,verified, and reconciled?: Partial Review Completed Reason for Partial Mediation Review: He said he has all of his medications including the omnipod and Dexcom and he did not have any questions about the med regime Any new allergies since your discharge?: No Dietary orders reviewed?: Yes Type of Diet Ordered:: carb modified Do you have support at home?: Yes Name of Support/Comfort Primary Source: he did not specify who  Medications Reviewed Today: Medications Reviewed Today   Medications were not reviewed in this encounter     Home Care and Equipment/Supplies: Were Home Health Services Ordered?: No Any new equipment or medical supplies ordered?: No  Functional Questionnaire: Do you need assistance with bathing/showering or dressing?: No Do you need assistance with meal preparation?: No Do you need assistance with eating?: No Do you have difficulty maintaining continence: No Do you need assistance with getting out of bed/getting out of a chair/moving?: No Do you have difficulty managing or taking your medications?: No  Follow up appointments reviewed: PCP Follow-up appointment confirmed?: Yes Date of PCP follow-up appointment?: 11/16/23 Follow-up Provider: Dr Laural Benes.  I offered to schedule him with  another provider in order to be seen sooner but he declined stating he wants to wait for  Dr [facility] Follow-up appointment confirmed?: Yes Date of Specialist follow-up appointment?: 09/23/23 Follow-Up Specialty Provider:: endocrinology Do you need transportation to your follow-up appointment?: No Do you understand care options if your condition(s) worsen?: Yes-patient verbalized understanding    SIGNATURE Robyne Peers, RN

## 2023-09-23 ENCOUNTER — Telehealth: Payer: Self-pay

## 2023-09-23 ENCOUNTER — Encounter: Payer: Self-pay | Admitting: Endocrinology

## 2023-09-23 ENCOUNTER — Ambulatory Visit: Admitting: Endocrinology

## 2023-09-23 VITALS — BP 116/80 | HR 94 | Resp 20 | Ht 71.0 in | Wt 149.2 lb

## 2023-09-23 DIAGNOSIS — E1029 Type 1 diabetes mellitus with other diabetic kidney complication: Secondary | ICD-10-CM

## 2023-09-23 DIAGNOSIS — R809 Proteinuria, unspecified: Secondary | ICD-10-CM

## 2023-09-23 LAB — POCT GLYCOSYLATED HEMOGLOBIN (HGB A1C): Hemoglobin A1C: 12.6 % — AB (ref 4.0–5.6)

## 2023-09-23 NOTE — Progress Notes (Addendum)
 Outpatient Endocrinology Note Jerome Kadence Mimbs, MD  09/23/23  Patient's Name: Jerome Murphy    DOB: 09-13-1990    MRN: [id number]                                                    REASON OF VISIT: Follow-up of type 1 diabetes mellitus  REFERRING PROVIDER: Storm Frisk, MD  PCP: Marcine Matar, MD  HISTORY OF PRESENT ILLNESS:   Jerome Murphy is a 33 y.o. old male with past medical history listed below, is here for follow-up of type 1 diabetes mellitus.   Pertinent Diabetes History: Patient was diagnosed with diabetes mellitus initially treated as type II in May 2019.  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  He was initially treated with metformin and later insulin therapy was added, compliance had been the issue in the past.  He has uncontrolled type 1 diabetes mellitus hemoglobin A1c in the range of 8 to 14.5%.    Latest Reference Range & Units 05/18/22 20:00  Insulin Antibodies, Human uU/mL 46 (H)  Glutamic Acid Decarb Ab 0.0 - 5.0 U/mL 1,013.1 (H)  C-Peptide 1.1 - 4.4 ng/mL 0.4 (L)  (H): Data is abnormally high (L): Data is abnormally low  Insulin pump OmniPod 5 was started in end of October 2024.   Chronic Diabetes Complications : Retinopathy: unknown. Last ophthalmology exam was done on Due Nephropathy: microalbuminuria + Peripheral neuropathy: yes, on gabapentin Coronary artery disease: no Stroke: no  Relevant comorbidities and cardiovascular risk factors: Obesity: no Body mass index is 20.81 kg/m.  Hypertension: no Hyperlipidemia. no  Current / Home Diabetic regimen includes:  OmniPod 5 with DeXCOM G7, using Novolog U100.  Pump setting: Basal rate: ( 17.7 units) 12 AM - 0.7 units/hr 7.0AM - 1.0 10 AM -  0.7  Correction factor : 1:50  Carb ratio : 1:1 ? ( small meal  carb count 2, medium meal carb count 4 and large meals carb count 6). He does not know carb counting.  Target 130-160  Active insulin time : 4 hr  Prior diabetic medications: Basal bolus regimen: Lantus 30 units in the morning. Novolog 5-10 units with meals when eating plus sliding scale.  Metformin.  Humalog mix 75/25   CONTINUOUS GLUCOSE MONITORING SYSTEM (CGMS) INTERPRETATION:  OmniPod 5 Pump & Sensor Download (Reviewed and summarized below.)  Dates: February 23 to September 18, 2023, 14 days   Average total daily insulin:  14 units, Basal: 95%, Food Bolus: 5%   Automated mode : 42%.      Trends:  Not sufficient CGM data to interpret fully.  He has used Dexcom G7 intermittently for couple of days in last 2 weeks.  He was on mostly limited automatic mode and manual mode and rarely with automatic mode.  When he was using sensor he has mostly hyperglycemia with blood sugar up to 400 range overnight and in the afternoon postprandially related with meals.  That has been trending down with blood sugar close to 200 range with the meal bolus.  Whenever he is on automatic more his blood sugar came down to low 100 range in between the meals.  He remains hyperglycemia mostly  with a blood sugar in the range of 200-400 range when not on automatic mode.  No hypoglycemia noted.  Patient has been bolusing for meals rarely with the pump, he has been bolusing occasionally for the meals.    Hypoglycemia: Patient has no hypoglycemic episodes. Patient has hypoglycemia awareness.  Factors modifying glucose control: 1.  Diabetic diet assessment: three meals a day, started to eat more of vegetable / greens and meats, Gatorade zero. Cut down on potato, starch but still eating breads.   2.  Staying active or exercising: active at work  3.  Medication compliance: compliant most of the time.  Interval history  Patient was hospitalized few days ago due to DKA, he ran out of the insulin.  After discharge  from the hospital on March 10 he has not restarted the pump at home.  He is currently taking NovoLog with insulin syringe 1-3 times a day when eating.  He reports overall feeling good, no sickness or abdominal pain.  Discussed in detail about OmniPod 5 how it works, basal rates and bolus.  Discussed about automated mode, limited mode and manual mode.  Discussed that is probably exiting out of automated because of the significant hyperglycemia, or when he is not having sensor or not connected with a sensor.  Discussed that he needs to be on automated mode as much as possible mostly all the time.  Patient is asked to activate automatic mode all the time.  Explained and discussed advantages of automated mode.  Discussed and clarify about using meal boluses, his pump setting is not designed to use actual carb counting.  He has limited knowledge about actual carb counting.  I advised to use meal size to bolus for meal, not actual carb counting when bolusing for the meals.  Patient restarted insulin pump with PODS and Dexcom G7 in the clinic visit today he applied today.  Insulin pump is restarted.  REVIEW OF SYSTEMS As per history of present illness.   PAST MEDICAL HISTORY: Past Medical History:  Diagnosis Date   Asthma    Diabetes mellitus type I (HCC)    Diabetes mellitus without complication (HCC)    DKA (diabetic ketoacidosis) (HCC) 05/18/2022   History of gallstones    Narcolepsy    Seizures (HCC)     PAST SURGICAL HISTORY: Past Surgical History:  Procedure Laterality Date   CIRCUMCISION     HERNIA REPAIR     TONSILLECTOMY      ALLERGIES: Allergies  Allergen Reactions   Robitussin [Guaifenesin] Hives and Itching    FAMILY HISTORY:  Family History  Problem Relation Age of Onset   Diabetes Mother    Suicidality Mother    Depression Father    Diabetes Maternal Uncle     SOCIAL HISTORY: Social History   Socioeconomic History   Marital status: Single    Spouse name: Not  on file   Number of children: 2   Years of education: Not on file   Highest education level: Associate degree: occupational, Scientist, product/process development, or vocational program  Occupational History   Occupation: Barbershop  Tobacco Use   Smoking status: Every Day    Current packs/day: 0.50    Types: Cigarettes   Smokeless tobacco: Never  Vaping Use   Vaping status: Never Used  Substance and Sexual Activity   Alcohol use: No    Comment: occasionallly   Drug use: No   Sexual activity: Yes    Birth control/protection: Condom    Comment: Partner uses  Depo  Other Topics Concern   Not on file  Social History Narrative   Not on file   Social Drivers of Health   Financial Resource Strain: High Risk (06/30/2023)   [facility] from [facility]   Overall Financial Resource Strain (CARDIA)    Difficulty of Paying Living Expenses: Hard  Food Insecurity: No Food Insecurity (09/20/2023)   Hunger Vital Sign    Worried About Running Out of Food in the Last Year: Never true    Ran Out of Food in the Last Year: Never true  Recent Concern: Food Insecurity - Food Insecurity Present (06/30/2023)   Received from [facility]   Hunger Vital Sign    Worried About Running Out of Food in the Last Year: Often true    Ran Out of Food in the Last Year: Often true  Transportation Needs: Unmet Transportation Needs (09/20/2023)   PRAPARE - Administrator, Civil Service (Medical): Yes    Lack of Transportation (Non-Medical): Yes  Physical Activity: Inactive (05/08/2023)   Exercise Vital Sign    Days of Exercise per Week: 0 days    Minutes of Exercise per Session: 0 min  Stress: Stress Concern Present (05/08/2023)   [employer] of Occupational Health - Occupational Stress Questionnaire    Feeling of Stress : Rather much  Social Connections: Moderately Isolated (05/08/2023)   Social Connection and Isolation Panel [NHANES]    Frequency of Communication with Friends and  Family: Twice a week    Frequency of Social Gatherings with Friends and Family: Twice a week    Attends Religious Services: More than 4 times per year    Active Member of [employer] or Organizations: No    Attends Engineer, structural: Not on file    Marital Status: Never married    MEDICATIONS:  Current Outpatient Medications  Medication Sig Dispense Refill   albuterol (VENTOLIN HFA) 108 (90 Base) MCG/ACT inhaler Inhale 2 puffs into the lungs every 6 (six) hours as needed for wheezing or shortness of breath. (Patient not taking: Reported on 09/20/2023) 8 g 2   Continuous Glucose Sensor (DEXCOM G7 SENSOR) MISC use as directed for continuous glucose monitoring 9 each 3   DULoxetine (CYMBALTA) 30 MG capsule Take 1 capsule (30 mg total) by mouth in the morning AND 2 capsules (60 mg total) at bedtime. 90 capsule 2   gabapentin (NEURONTIN) 300 MG capsule Take 1 capsule (300 mg total) by mouth 3 (three) times daily. 90 capsule 5   hydrOXYzine (ATARAX) 25 MG tablet Take 1-2 tablets (25-50 mg total) by mouth 2 (two) times daily as needed (panic attacks or sleep). 120 tablet 2   insulin aspart (NOVOLOG) 100 UNIT/ML injection Use via insulin pump 60 units/day. 20 mL 1   Insulin Disposable Pump (OMNIPOD 5 DEXG7G6 INTRO GEN 5) KIT Use as directed. 1 kit 0   Insulin Disposable Pump (OMNIPOD 5 DEXG7G6 PODS GEN 5) MISC use as directed change every 3 (three) days. 30 each 3   Insulin Pen Needle (PEN NEEDLES 3/16") 31G X 5 MM MISC Use 4 times a day 300 each 4   Insulin Syringe-Needle U-100 (INSULIN SYRINGE 1CC/31GX5/16") 31G X 5/16" 1 ML MISC 1 Device by Does not apply route daily. 100 each 3   sertraline (ZOLOFT) 50 MG tablet Take 50 mg by mouth daily.     No current facility-administered medications for this visit.    PHYSICAL EXAM: Vitals:   09/23/23 9604  BP: 116/80  Pulse: 94  Resp: 20  SpO2: 99%  Weight: 149 lb 3.2 oz (67.7 kg)  Height: 5\' 11"  (1.803 m)      Body mass index is 20.81  kg/m.  Wt Readings from Last 3 Encounters:  09/23/23 149 lb 3.2 oz (67.7 kg)  06/15/23 156 lb (70.8 kg)  05/27/23 148 lb 12.8 oz (67.5 kg)    General: Well developed, well nourished male in no apparent distress.  HEENT: AT/[place], no external lesions.  Eyes: Conjunctiva clear and no icterus. Neck: Neck supple  Lungs: Respirations not labored Neurologic: Alert, oriented, normal speech Extremities / Skin: Dry.  Psychiatric: Does not appear depressed or anxious  Diabetic Foot Exam - Simple   No data filed    LABS Reviewed Lab Results  Component Value Date   HGBA1C 12.6 (A) 09/23/2023   HGBA1C 14.3 (A) 04/29/2023   HGBA1C 14.5 (H) 02/10/2023   No results found for: "FRUCTOSAMINE" Lab Results  Component Value Date   CHOL 132 08/28/2020   HDL 37 (L) 08/28/2020   LDLCALC 82 08/28/2020   TRIG 63 08/28/2020   CHOLHDL 3.6 08/28/2020   Lab Results  Component Value Date   MICRALBCREAT 81 (H) 02/18/2023   MICRALBCREAT 4 11/26/2020   Lab Results  Component Value Date   CREATININE 0.81 09/21/2023   Lab Results  Component Value Date   GFR 110.92 03/04/2023    ASSESSMENT / PLAN  1. 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   Lab Results  Component Value Date   HGBA1C 12.6 (A) 09/23/2023    - Hemoglobin A1c goal : <6.5%  Patient has been off of insulin pump, restarted insulin pump OmniPod 5 with Dexcom G7 in the clinic visit today.  Detail in HPI.  Changed pump setting as follows.  Pump restarted, I guided through the steps.  - Medications:   OmniPod 5 with DeXCOM G7, using Novolog U100.  Pump setting: Basal rate: 12 AM - 0.7 units/hr, changed to 1.0. 7.0AM - 1.0, changed to 1.2 10 AM -  0.7, changed to 1.0  Correction factor : 1:50, changed to 1 : 45  Carb ratio : 1:1  Advised to use carb count as 5 for a small  meal, 10 for medium meal and 15 for large meal.  Advised to use bolus program from the pump, that will take into account of blood sugar level prior to eating.  Advised to bolus for all the meals.  Advised to use automatic mode as much as possible.  Active insulin time : 4 hr  - Home glucose testing: Dexcom G7 and check as needed.   - Discussed/ Gave Hypoglycemia treatment plan.  # Consult : not required at this time.   # Annual urine for microalbuminuria/ creatinine ratio. Last  Lab Results  Component Value Date   MICRALBCREAT 81 (H) 02/18/2023    # Foot check nightly / neuropathy, continue gabapentin.  # Annual dilated diabetic eye exams, will refer to ophthalmology in the follow-up visit.  - Diet: Make healthy diabetic food choices - Life style / activity / exercise: Discussed.  2. Blood pressure  -  BP Readings from Last 1 Encounters:  09/23/23 116/80    - Control is in target.  - No change in current plans.  3. 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 All questions were answered.   DISPOSITION Follow up in clinic in 6 weeks suggested.  Also follow-up with diabetic educator for updates concerns.   All questions answered and patient verbalized understanding of the plan.  Jerome Tyshan Enderle, MD [facility] Endocrinology [facility] [address] [place], [address] [place], [place] [address] Phone # [phone]  At least part of this note was generated using voice recognition software. Inadvertent word errors may have occurred, which were not recognized during the  proofreading process.

## 2023-09-23 NOTE — Addendum Note (Signed)
 Addended by: Meiling Hendriks, [place] on: 09/23/2023 12:34 PM   Modules accepted: Level of Service

## 2023-09-23 NOTE — Transitions of Care (Post Inpatient/ED Visit) (Signed)
   09/23/2023  Name: Josean Lycan MRN: [id number] DOB: [birth year]  Today's TOC FU Call Status: Today's TOC FU Call Status:: Unsuccessful Call (2nd Attempt) TOC FU Call Complete Date: 09/23/23  Attempted to reach the patient regarding the most recent Inpatient/ED visit.  Follow Up Plan: Additional outreach attempts will be made to reach the patient to complete the Transitions of Care (Post Inpatient/ED visit) call.   Charlena Haub A. Mliss Fritz RN, BA, [facility], CRRN [facility] RN Care Manager, Transition of Care [phone]

## 2023-09-23 NOTE — Patient Instructions (Addendum)
 We changed basal rates today.  We also change the correction factor today.  When you are eating do not enter the actual carb count.  Your pump is not set up to enter actual carb counting otherwise that would be too much high dose of mealtime insulin.  For the small meal use 5 carb  Medium meal 10 carb  Large meal 15 carb  Bolus for the meals for all the meals including snacks.  If you exit out of automatic mode you need to manually change back to automatic mode.  Try to stay on automatic mode as much as you can.

## 2023-09-24 ENCOUNTER — Telehealth: Payer: Self-pay

## 2023-09-24 NOTE — Transitions of Care (Post Inpatient/ED Visit) (Signed)
   09/24/2023  Name: Jerome Murphy MRN: [id number] DOB: 13-Nov-1990  Today's TOC FU Call Status: Today's TOC FU Call Status:: Unsuccessful Call (3rd Attempt) Unsuccessful Call (3rd Attempt) Date: 09/24/23  Attempted to reach the patient regarding the most recent Inpatient/ED visit.  Follow Up Plan: No further outreach attempts will be made at this time. We have been unable to contact the patient.  Caylynn Minchew A. Mliss Fritz RN, BA, [facility], CRRN [facility] RN Care Manager, Transition of Care [phone]

## 2023-10-01 ENCOUNTER — Other Ambulatory Visit: Payer: Self-pay

## 2023-10-14 ENCOUNTER — Other Ambulatory Visit: Payer: Self-pay

## 2023-10-14 NOTE — Progress Notes (Signed)
 BH MD Outpatient Progress Note  10/18/2023 9:52 AM Jerome Murphy  MRN:  [id number]  Assessment:  [name] presents for follow-up evaluation. Today, 10/18/23, patient reports he did not increase Cymbalta as previously discussed due to confusion with dosing. Reviewed current psychotropics, dosing schedule, and how to take. Despite not making this increase, he reports noticing more benefit from the Cymbalta in the last month. Reports improved resiliency in face of acute stressors and shares that supports have noticed this as well. He is using Atarax as needed about twice daily. Reports he is sleeping better although still disrupted due to issues with his pump; actively working with endocrinologist to rectify this. Identifies significant benefit from work with nutritionist in selecting foods and using moderation to keep sugars controlled. Amenable to continuing medications as he is currently taking them. He is looking forward to establishing with psychotherapist this week.  RTC in 2 months by video.  Identifying Information: Jerome Murphy is a 33 y.o. male with no formal past psychiatric history and medical history of T1DM with neuropathy, asthma, possible seizures in childhood, and Vitamin D deficiency who is an established patient with [facility]. Patient carries history of severely uncontrolled T1DM with significant weight loss over the past few years both of which have impacted sense of identity, self-worth, and belonging in peer groups. He reports that due to sudden drop in weight, he has frequently been accused by prior supports of abusing substances which has led him to avoid social outings. He additionally reports that fatigue related to hyperglycemia interferes with him engaging with family as desired. While he endorses low energy, motivation, appetite and sleep disturbance, and periods of hopelessness these symptoms are greatly confounded  by uncontrolled T1DM and notably desire to be engaging with family/friends remains intact. He reports considerable anxiety surrounding health and fear of hypoglycemic episodes leading to development of panic attacks and difficulty falling asleep at night. At this time, would conceptualize symptoms as adjustment reaction and anxiety disorder due to medical illness however will continue to monitor for development of MDD.   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   Subjective:  Chief Complaint:  Chief Complaint  Patient presents with   Medication Management    Interval History:   Chart review: -- Medical admission 09/20/23-09/21/23 for DKA.  -- Endocrinology 09/23/23: Insulin pump restarted  Patient reports he did not increase Cymbalta as previously discussed and continuing to take 30 mg BID due to confusion with dosing. Feels he has noticed a difference from this medication in the past month  in that he is more easily able to handle stressors; mom has pointed this out as well. Not as bothered by things like he was before.  Using Atarax 25-50 mg 1-2 times daily; feels 50 mg sometimes makes him feel "too mellow" but does take 50 mg sometimes when he is having a really difficult time.  Sleep has been better and getting about 5-6  hours nightly. States there have been some issues with pump equipment  but has been working with doctors to resolve this.   Eating "better" and has a nutritionist which he has found to be of significant benefit.   Insurance company will be sending him a pill box and intends to use this to manage medications.   Aware of initial therapy appt on Thursday. No other questions/concerns and amenable to continuing medications as prescribed.   Visit Diagnosis:    ICD-10-CM   1. Anxiety disorder due to general medical condition with panic attack  F06.4    F41.0     2. Adjustment disorder with depressed mood  F43.21       Past Psychiatric History:  Diagnoses: no prior formal psychiatric diagnoses Medication trials: denies Previous psychiatrist/therapist: denies Hospitalizations: denies Suicide attempts: denies SIB: denies Hx of violence towards others: denies Current access to guns: yes Hx of trauma/abuse: requires further evaluation Substance use:              -- Etoh: denies             -- Tobacco: 0.5-1 ppd             -- Denies use of illicit drugs including cannabis, CBD/THC, stimulants, BZDs, hallucinogens  Past Medical History:  Past Medical History:  Diagnosis Date   Asthma    Diabetes mellitus type I (HCC)    Diabetes mellitus without complication (HCC)    DKA (diabetic ketoacidosis) (HCC) 05/18/2022   History of gallstones    Narcolepsy    Seizures (HCC)     Past Surgical History:  Procedure Laterality Date   CIRCUMCISION     HERNIA REPAIR     TONSILLECTOMY      Family Psychiatric History:  Mom: suicide attempt Dad: depression following MI  Family History:  Family History  Problem Relation Age of Onset   Diabetes Mother    Suicidality Mother    Depression Father    Diabetes Maternal Uncle     Social History:  Academic/Vocational: 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   Social History   Socioeconomic History   Marital status: Single    Spouse name: Not on file   Number of children: 2   Years of education: Not on  file   Highest education level: Associate degree: occupational, Scientist, product/process development, or vocational program  Occupational History   Occupation: Barbershop  Tobacco Use   Smoking status: Every Day    Current packs/day: 0.50    Types: Cigarettes   Smokeless tobacco: Never  Vaping Use   Vaping status: Never Used  Substance and Sexual Activity   Alcohol use: No    Comment: occasionallly   Drug use: No   Sexual activity: Yes    Birth control/protection: Condom    Comment: Partner uses Depo  Other Topics Concern   Not on file  Social History Narrative   Not on file   Social Drivers of Health   Financial Resource Strain: High Risk (06/30/2023)   Received from [facility]   Overall Financial Resource Strain (CARDIA)    Difficulty of Paying Living Expenses: Hard  Food Insecurity: No Food Insecurity (09/20/2023)   Hunger Vital Sign    Worried About Running Out of Food in the Last Year: Never true    Ran Out of Food in the Last Year: Never true  Recent Concern: Food Insecurity - Food Insecurity Present (06/30/2023)   Received from [facility]  Hunger Vital Sign    Worried About Running Out of Food in the Last Year: Often true    Ran Out of Food in the Last Year: Often true  Transportation Needs: Unmet Transportation Needs (09/20/2023)   PRAPARE - Administrator, Civil Service (Medical): Yes    Lack of Transportation (Non-Medical): Yes  Physical Activity: Inactive (05/08/2023)   Exercise Vital Sign    Days of Exercise per Week: 0 days    Minutes of Exercise per Session: 0 min  Stress: Stress Concern Present (05/08/2023)   [employer] of Occupational Health - Occupational Stress Questionnaire    Feeling of Stress : Rather much  Social Connections: Moderately Isolated (05/08/2023)   Social Connection and Isolation Panel [NHANES]    Frequency of Communication with Friends and Family: Twice a week    Frequency of Social Gatherings with  Friends and Family: Twice a week    Attends Religious Services: More than 4 times per year    Active Member of [employer] or Organizations: No    Attends Engineer, structural: Not on file    Marital Status: Never married    Allergies:  Allergies  Allergen Reactions   Robitussin [Guaifenesin] Hives and Itching    Current Medications: Current Outpatient Medications  Medication Sig Dispense Refill   albuterol (VENTOLIN HFA) 108 (90 Base) MCG/ACT inhaler Inhale 2 puffs into the lungs every 6 (six) hours as needed for wheezing or shortness of breath. (Patient not taking: Reported on 09/20/2023) 8 g 2   Continuous Glucose Sensor (DEXCOM G7 SENSOR) MISC use as directed for continuous glucose monitoring 9 each 3   DULoxetine (CYMBALTA) 30 MG capsule Take 1 capsule (30 mg total) by mouth 2 (two) times daily. 60 capsule 2   gabapentin (NEURONTIN) 300 MG capsule Take 1 capsule (300 mg total) by mouth 3 (three) times daily. 90 capsule 5   hydrOXYzine (ATARAX) 25 MG tablet Take 1-2 tablets (25-50 mg total) by mouth 2 (two) times daily as needed (panic attacks or sleep). 120 tablet 2   insulin aspart (NOVOLOG) 100 UNIT/ML injection Use via insulin pump 60 units/day. 20 mL 1   Insulin Disposable Pump (OMNIPOD 5 DEXG7G6 INTRO GEN 5) KIT Use as directed. 1 kit 0   Insulin Disposable Pump (OMNIPOD 5 DEXG7G6 PODS GEN 5) MISC use as directed change every 3 (three) days. 30 each 3   Insulin Pen Needle (PEN NEEDLES 3/16") 31G X 5 MM MISC Use 4 times a day 300 each 4   Insulin Syringe-Needle U-100 (INSULIN SYRINGE 1CC/31GX5/16") 31G X 5/16" 1 ML MISC 1 Device by Does not apply route daily. 100 each 3   No current facility-administered medications for this visit.    ROS: As outlined above  Objective:  Psychiatric Specialty Exam: There were no vitals taken for this visit.There is no height or weight on file to calculate BMI.  General Appearance: Casual and Fairly Groomed  Eye Contact:  Good  Speech:   Clear and Coherent and Normal Rate  Volume:  Normal  Mood:   "more calm"  Affect:   Euthymic; calm  Thought Content:  Denies AVH; no overt delusional thought content on interview      Suicidal Thoughts:  No  Homicidal Thoughts:  No  Thought Process:  Goal Directed and Linear  Orientation:  Full (Time, Place, and Person)    Memory:  Grossly intact  Judgment:  Good  Insight:  Good  Concentration:  Concentration: Good  Recall:  not formally assessed   Fund of Knowledge: Fair  Language: Good  Psychomotor Activity:  Normal  Akathisia:  NA  AIMS (if indicated): NA  Assets:  Communication Skills Desire for Improvement Housing Intimacy Resilience Transportation  ADL's:  Intact  Cognition: WNL  Sleep:   improving   PE: General: sits comfortably in view of camera; no acute distress  Pulm: no increased work of breathing on room air  MSK: all extremity movements appear intact  Neuro: no focal neurological deficits observed  Gait & Station: unable to assess by video    Metabolic Disorder Labs: Lab Results  Component Value Date   HGBA1C 12.6 (A) 09/23/2023   MPG 369 02/10/2023   MPG 352.23 05/18/2022   No results found for: "PROLACTIN" Lab Results  Component Value Date   CHOL 132 08/28/2020   TRIG 63 08/28/2020   HDL 37 (L) 08/28/2020   CHOLHDL 3.6 08/28/2020   LDLCALC 82 08/28/2020   LDLCALC 107 (H) 08/29/2019   Lab Results  Component Value Date   TSH 0.973 06/15/2023   TSH 1.130 08/28/2020    Therapeutic Level Labs: No results found for: "LITHIUM" No results found for: "VALPROATE" No results found for: "CBMZ"  Screenings:  GAD-7    Flowsheet Row Office Visit from 06/15/2023 in [facility] [place] - A Dept Of [place]. [facility] Office Visit from 05/08/2023 in [facility] [place] - A Dept Of [place]. [facility] Office Visit from 02/18/2023 in [facility] - A Dept Of [place] Bridegroom. [facility]  Office Visit from 11/25/2020 in [facility] CL[facility]IC 1  Total GAD-7 Score 8 6 1  0      PHQ2-9    Flowsheet Row Office Visit from 06/15/2023 in [facility] [place] - A Dept Of [place]. [facility] from 06/14/2023 in [facility] [place] - A Dept Of [place] Brideg[place]. [facility] Office Visit from 05/08/2023 in [facility] [place] - A Dept Of [place]. [facility] Nutrition from 03/29/2023 in [facility]  - A Dept Of [place]. [facility] Office Visit from 02/18/2023 in [facility] - A Dept Of [place]a Bridegroom. [facility]  PHQ-2 Total Score 4 4 4  0 1  PHQ-9 Total Score 10 10 9  -- 4      Flowsheet Row ED to Hosp-Admission (Discharged) from 09/20/2023 in [facility] (1A) ED from 03/18/2023 in [facility] at [facility] ED to Hosp-Admission (Discharged) from 02/10/2023 in [facility] ICU/CCU  C-SSRS RISK CATEGORY No Risk No Risk No Risk       Collaboration of Care: Collaboration of Care: Medication Management AEB active medication management, Psychiatrist AEB established with this provider, and Referral or follow-up with counselor/therapist AEB patient to get rescheduled for individual therapy  Patient/Guardian was advised Release of Information must be obtained prior to any record release in order to collaborate their care with an outside provider. Patient/Guardian was advised if they have not already done so to contact the registration department to sign all necessary forms in order for [place] to release information regarding their care.   Consent: Patient/Guardian gives verbal consent for treatment and assignment of benefits for services provided during this visit. Patient/Guardian expressed understanding and agreed to proceed.   Televisit via video: I connected with patient  on 10/18/23 at  9:00 AM  EDT by a video enabled telemedicine application and verified that I am speaking with the correct person using two identifiers.  Location: Patient: home address in [place] Provider: remote office in [place]   I discussed the limitations of evaluation and management by telemedicine and the availability of in person appointments. The patient expressed understanding and agreed to proceed.  I discussed the assessment and treatment plan with the patient. The patient was provided an opportunity to ask questions and all were answered. The patient agreed with the plan and demonstrated an understanding of the instructions.   The patient was advised to call back or seek an in-person evaluation if the symptoms worsen or if the condition fails to improve as anticipated.  I provided 25 minutes dedicated to the care of this patient via video on the date of this encounter to include chart review, face-to-face time with the patient, medication management/counseling, documentation.  Othmar Ringer A Avilyn Virtue 10/18/2023, 9:52 AM

## 2023-10-15 ENCOUNTER — Other Ambulatory Visit: Payer: Self-pay

## 2023-10-18 ENCOUNTER — Other Ambulatory Visit: Payer: Self-pay

## 2023-10-18 ENCOUNTER — Telehealth (INDEPENDENT_AMBULATORY_CARE_PROVIDER_SITE_OTHER): Payer: Medicaid Other | Admitting: Psychiatry

## 2023-10-18 ENCOUNTER — Encounter (HOSPITAL_COMMUNITY): Payer: Self-pay | Admitting: Psychiatry

## 2023-10-18 DIAGNOSIS — F4321 Adjustment disorder with depressed mood: Secondary | ICD-10-CM | POA: Diagnosis not present

## 2023-10-18 DIAGNOSIS — E109 Type 1 diabetes mellitus without complications: Secondary | ICD-10-CM

## 2023-10-18 DIAGNOSIS — F064 Anxiety disorder due to known physiological condition: Secondary | ICD-10-CM

## 2023-10-18 DIAGNOSIS — F410 Panic disorder [episodic paroxysmal anxiety] without agoraphobia: Secondary | ICD-10-CM | POA: Diagnosis not present

## 2023-10-18 MED ORDER — DULOXETINE HCL 30 MG PO CPEP
30.0000 mg | ORAL_CAPSULE | Freq: Two times a day (BID) | ORAL | 2 refills | Status: DC
  Filled 2023-10-18 – 2023-11-23 (×2): qty 60, 30d supply, fill #0
  Filled 2023-12-22: qty 60, 30d supply, fill #1

## 2023-10-18 MED ORDER — HYDROXYZINE HCL 25 MG PO TABS
25.0000 mg | ORAL_TABLET | Freq: Two times a day (BID) | ORAL | 2 refills | Status: AC | PRN
  Filled 2023-10-18: qty 120, 30d supply, fill #0
  Filled 2023-12-22: qty 120, 30d supply, fill #1
  Filled 2024-07-04: qty 120, 30d supply, fill #0

## 2023-10-18 NOTE — Patient Instructions (Signed)

## 2023-10-21 ENCOUNTER — Encounter (HOSPITAL_COMMUNITY): Payer: Self-pay | Admitting: Licensed Clinical Social Worker

## 2023-10-21 ENCOUNTER — Ambulatory Visit (INDEPENDENT_AMBULATORY_CARE_PROVIDER_SITE_OTHER): Payer: Medicaid Other | Admitting: Licensed Clinical Social Worker

## 2023-10-21 DIAGNOSIS — F4322 Adjustment disorder with anxiety: Secondary | ICD-10-CM | POA: Diagnosis not present

## 2023-10-21 DIAGNOSIS — F064 Anxiety disorder due to known physiological condition: Secondary | ICD-10-CM

## 2023-10-21 DIAGNOSIS — F4321 Adjustment disorder with depressed mood: Secondary | ICD-10-CM

## 2023-10-21 NOTE — Progress Notes (Signed)
 Comprehensive Clinical Assessment (CCA) Note  10/21/2023 Jerome Murphy [id number]    Virtual Visit via Video Note  I connected with [name] on 10/21/23 at  9:00 AM EDT by a video enabled telemedicine application and verified that I am speaking with the correct person using two identifiers.  Location: Patient: [facility]  Provider: Providers Home    I discussed the limitations of evaluation and management by telemedicine and the availability of in person appointments. The patient expressed understanding and agreed to proceed.     I discussed the assessment and treatment plan with the patient. The patient was provided an opportunity to ask questions and all were answered. The patient agreed with the plan and demonstrated an understanding of the instructions.   The patient was advised to call back or seek an in-person evaluation if the symptoms worsen or if the condition fails to improve as anticipated.  I provided 30 minutes of non-face-to-face time during this encounter.   Weber Cooks, LCSW  Chief Complaint:  Chief Complaint  Patient presents with   Anxiety   Depression   Visit Diagnosis: adjustment disorder and anxiety due to medical condition     Client is a 33 year old  male. Client is referred by  unknown provider for a depression and anxiety.   Client states mental health symptoms as evidenced by:   Depression Fatigue; Hopelessness; Irritability; Sleep (too much or little); Weight gain/loss; Worthlessness; Increase/decrease in appetite; Difficulty Concentrating; Change in energy/activity Fatigue; Hopelessness; Irritability; Sleep (too much or little); Weight gain/loss; Worthlessness; Increase/decrease in appetite; Difficulty Concentrating; Change in energy/activity  Duration of Depressive Symptoms N/A N/A  Mania None None  Anxiety Difficulty concentrating; Fatigue; Irritability; Restlessness; Sleep; Tension; Worrying Difficulty  concentrating; Fatigue; Irritability; Restlessness; Sleep; Tension; Worrying  Psychosis None None  Trauma None None  Obsessions None None  Compulsions None None  Inattention None None  Hyperactivity/Impulsivity None None  Oppositional/Defiant Behaviors None None  Emotional Irregularity Chronic feelings of emptiness; Unstable self-image Chronic feelings of emptiness; Unstable self-image    Client denies suicidal and homicidal ideations at this time  Client denies hallucinations and delusions at this time   Client was screened for the following SDOH: smoking, financials. Transportation, exercise, stress/tension, social interactions, PHQ-9    Assessment Information that integrates subjective and objective details with a therapist's professional interpretation:   Jerome Murphy was alert and oriented x 5.  He was pleasant, cooperative, maintained good eye contact.  He engaged well in CCA and was dressed casually.  Jerome Murphy presented with anxious and depressed mood\affect.  Patient comes in as a referral from an unknown source per patient.  He reports that he has been struggling with anxiety and depression.  This is due to diabetes.  Patient reports that he was originally diagnosed with type 2 diabetes but now it is known that he has had type 1 diabetes.  He reports losing over 100 pounds over the past year.  Patient reports tension, worry, and restlessness due to his diabetes.  He reports that his appearance is greatly changed over the past year and people think that he is on drugs.  Compton states that he feels judged and does not like to go out because people think that he is on drugs.  He reports struggling with his appearance and utilizing his support system as even when he goes to church people tell him that he needs to acknowledge that he is on drugs.  Patient reports that he was hospitalized in  the past 30 days due to his diabetes.    Jerome Murphy states that he has a limited support system outside of  his mother.  He reports that he living in his mother's home and she has his primary support system.  LCSW notes that patient around a 30-minute mark disconnected from session.  LCSW waited 10 minutes and patient came back in after 1 minute call drop again and LCSW waited another 10 minutes before disconnecting.  Patient will have to call back for follow-up appointment but LCSW does recommend individual therapy every 3 weeks by this LCSW.  Patient denies any suicidal or homicidal ideations.  Patient denies any auditory or visual hallucinations.  [name] states that he would like to learn coping skills to help better manage his anxiety due to his diabetes.   Client was in agreement with treatment recommendations.   CCA Screening, Triage and Referral (STR)  Patient Reported Information  Referral name: unknown by pt  Whom do you see for routine medical problems? Primary Care  Practice/Facility Name: [facility], MD  How Long Has This Been Causing You Problems? > than 6 months  What Do You Feel Would Help You the Most Today? Treatment for Depression or other mood problem; Stress Management; Medication(s)  Have You Recently Been in Any Inpatient Treatment (Hospital/Detox/Crisis Center/28-Day Program)? Yes  Name/Location of Program/Hospital:[place] for diabetes  Have You Ever Received Services From [facility] Before? Yes  Who Do You See at [facility]? multiple services  Have You Recently Had Any Thoughts About Hurting Yourself? No  Are You Planning to Commit Suicide/Harm Yourself At This time? No  Have you Recently Had Thoughts About Hurting Someone Jerome Murphy? No  Explanation: No data recorded  Have You Used Any Alcohol or Drugs in the Past 24 Hours? No   Do You Currently Have a Therapist/Psychiatrist? Yes  Name of Therapist/Psychiatrist: [place] [facility]      CCA Screening Triage Referral Assessment Type of Contact: Tele-Assessment  Is this Initial or  Reassessment? Initial Assessment  Date Telepsych consult ordered in [facility]:  10/21/23  Time Telepsych consult ordered in [facility]:  0921  Is CPS involved or ever been involved? Never  Is APS involved or ever been involved? Never   Patient Determined To Be At Risk for Harm To Self or Others Based on Review of Patient Reported Information or Presenting Complaint? No  Method: No Plan  Availability of Means: No access or NA  Intent: Vague intent or NA  Notification Required: No need or identified person  Are There Guns or Other Weapons in Your Home? No   Location of Assessment: [facility]   [place] of Residence: [place]   Options For Referral: Outpatient Therapy   CCA Biopsychosocial Intake/Chief Complaint:  Pt reports anxiety to control his diabetes. He reports being judged for all the weight he lost over the past year and people think he is on drugs. Jadarius reports over 100lbs lost  Current Symptoms/Problems: tension, worry, insomnia, restlessness, hopelessness, worthlessness, fatigue   Patient Reported Schizophrenia/Schizoaffective Diagnosis in Past: No   Strengths: willing to engage in treatment  Preferences: mental health treatment for therapy and medication   Type of Services Patient Feels are Needed: therapy  Mental Health Symptoms Depression:  Fatigue; Hopelessness; Irritability; Sleep (too much or little); Weight gain/loss; Worthlessness; Increase/decrease in appetite; Difficulty Concentrating; Change in energy/activity   Duration of Depressive symptoms: N/A   Mania:  None   Anxiety:   Difficulty concentrating; Fatigue;  Irritability; Restlessness; Sleep; Tension; Worrying   Psychosis:  None   Duration of Psychotic symptoms: No data recorded  Trauma:  None   Obsessions:  None   Compulsions:  None   Inattention:  None   Hyperactivity/Impulsivity:  None   Oppositional/Defiant Behaviors:  None   Emotional Irregularity:  Chronic  feelings of emptiness; Unstable self-image   Other Mood/Personality Symptoms:  No data recorded   Mental Status Exam Appearance and self-care  Stature:  Average   Weight:  Average weight   Clothing:  Casual   Grooming:  Normal   Cosmetic use:  None   Posture/gait:  Normal   Motor activity:  Not Remarkable   Sensorium  Attention:  Normal   Concentration:  Scattered   Orientation:  X5   Recall/memory:  Normal   Affect and Mood  Affect:  Anxious; Constricted; Depressed; Restricted   Mood:  Anxious; Depressed; Hopeless; Dysphoric; Negative; Pessimistic; Worthless   Relating  Eye contact:  Normal   Facial expression:  Anxious   Attitude toward examiner:  Cooperative   Thought and Language  Speech flow: Clear and Coherent   Thought content:  Appropriate to Mood and Circumstances   Preoccupation:  Ruminations   Hallucinations:  None   Organization:  No data recorded  [employer] of Knowledge:  Fair   Intelligence:  Average   Abstraction:  Functional   Judgement:  Fair   Reality Testing:  Adequate   Insight:  Fair   Decision Making:  No data recorded  Social Functioning  Social Maturity:  No data recorded  Social Judgement:  Victimized   Stress  Stressors:  Illness; Relationship; Financial   Coping Ability:  Overwhelmed; Exhausted   Skill Deficits:  No data recorded  Supports:  No data recorded    Religion: Religion/Spirituality Are You A Religious Person?: Yes  Leisure/Recreation: Leisure / Recreation Do You Have Hobbies?: No  Exercise/Diet: Exercise/Diet Do You Exercise?: No Have You Gained or Lost A Significant Amount of Weight in the Past Six Months?: Yes-Lost Number of Pounds Lost?: 50 Do You Follow a Special Diet?: No Do You Have Any Trouble Sleeping?: Yes Explanation of Sleeping Difficulties: tension and worry about blood sugar cause him to stay up at night   CCA Employment/Education Employment/Work  Situation:    Education:     CCA Family/Childhood History Family and Relationship History: Family history Marital status: Single Are you sexually active?: Yes What is your sexual orientation?: hetrosexual  Childhood History:  Childhood History By whom was/is the patient raised?: Mother Description of patient's relationship with caregiver when they were a child: good Patient's description of current relationship with people who raised him/her: good Did patient suffer any verbal/emotional/physical/sexual abuse as a child?: No Did patient suffer from severe childhood neglect?: No Has patient ever been sexually abused/assaulted/raped as an adolescent or adult?: No Was the patient ever a victim of a crime or a disaster?: No Witnessed domestic violence?: No Has patient been affected by domestic violence as an adult?: No  Child/Adolescent Assessment:     DSM5 Diagnoses: Patient Active Problem List   Diagnosis Date Noted   DKA (diabetic ketoacidosis) (HCC) 09/20/2023   Peripheral neuropathy 09/20/2023   Depression 09/20/2023   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   Adjustment disorder with depressed mood 06/16/2023   Major depressive disorder, single episode, moderate (HCC) 06/16/2023   Type 1 diabetes mellitus without complication (HCC) 03/04/2023   Neuropathy 02/18/2023  Seizure disorder (HCC) 02/10/2023   Uncontrolled type 1 diabetes mellitus with hyperglycemia (HCC) 07/09/2022   Asthma 07/09/2022   Nausea vomiting and diarrhea 07/09/2022   Vitamin D deficiency 11/26/2020   Mixed hyperlipidemia 10/24/2019   Tobacco dependence 08/29/2019    Referrals to Alternative Service(s): Referred to Alternative Service(s):   Place:   Date:   Time:    Referred to Alternative Service(s):   Place:   Date:   Time:    Referred to Alternative Service(s):   Place:   Date:   Time:    Referred to Alternative Service(s):   Place:   Date:   Time:       Collaboration of Care: Other None today   Patient/Guardian was advised Release of Information must be obtained prior to any record release in order to collaborate their care with an outside provider. Patient/Guardian was advised if they have not already done so to contact the registration department to sign all necessary forms in order for [place] to release information regarding their care.   Consent: Patient/Guardian gives verbal consent for treatment and assignment of benefits for services provided during this visit. Patient/Guardian expressed understanding and agreed to proceed.   Weber Cooks, LCSW

## 2023-10-22 ENCOUNTER — Other Ambulatory Visit: Payer: Self-pay

## 2023-10-22 ENCOUNTER — Other Ambulatory Visit (HOSPITAL_COMMUNITY): Payer: Self-pay

## 2023-11-01 ENCOUNTER — Other Ambulatory Visit (HOSPITAL_COMMUNITY): Payer: Self-pay

## 2023-11-01 ENCOUNTER — Telehealth: Payer: Self-pay | Admitting: Pharmacy Technician

## 2023-11-01 NOTE — Telephone Encounter (Signed)
 Pharmacy Patient Advocate Encounter   Received notification from CoverMyMeds that prior authorization for Dexcom G7 Sensor is required/requested.   Insurance verification completed.   The patient is insured through [facility] .   Per test claim: PA required; PA submitted to above mentioned insurance via CoverMyMeds Key/confirmation #/EOC [id number] Status is pending

## 2023-11-02 NOTE — Telephone Encounter (Signed)
 Pharmacy Patient Advocate Encounter  Received notification from [facility] that Prior Authorization for Dexcom G7 Sensor has been DENIED.  Full denial letter will be uploaded to the media tab. See denial reason below.   PA #/Case ID/Reference #: [id number]

## 2023-11-16 ENCOUNTER — Ambulatory Visit: Payer: Medicaid Other | Admitting: Internal Medicine

## 2023-11-23 ENCOUNTER — Other Ambulatory Visit: Payer: Self-pay

## 2023-11-23 DIAGNOSIS — R809 Proteinuria, unspecified: Secondary | ICD-10-CM

## 2023-11-23 MED ORDER — INSULIN ASPART 100 UNIT/ML IJ SOLN
60.0000 [IU] | Freq: Every day | INTRAMUSCULAR | 1 refills | Status: DC
  Filled 2023-11-23: qty 20, 33d supply, fill #0
  Filled 2023-12-22: qty 20, 33d supply, fill #1

## 2023-11-25 ENCOUNTER — Other Ambulatory Visit: Payer: Self-pay | Admitting: Endocrinology

## 2023-11-25 ENCOUNTER — Other Ambulatory Visit: Payer: Self-pay

## 2023-11-26 ENCOUNTER — Ambulatory Visit (HOSPITAL_COMMUNITY): Admitting: Licensed Clinical Social Worker

## 2023-11-26 ENCOUNTER — Other Ambulatory Visit: Payer: Self-pay

## 2023-11-26 DIAGNOSIS — F410 Panic disorder [episodic paroxysmal anxiety] without agoraphobia: Secondary | ICD-10-CM | POA: Diagnosis not present

## 2023-11-26 DIAGNOSIS — F4321 Adjustment disorder with depressed mood: Secondary | ICD-10-CM | POA: Diagnosis not present

## 2023-11-26 DIAGNOSIS — F064 Anxiety disorder due to known physiological condition: Secondary | ICD-10-CM | POA: Diagnosis not present

## 2023-11-26 MED ORDER — INSULIN SYRINGE 31G X 5/16" 1 ML MISC
1.0000 | Freq: Every day | 3 refills | Status: DC
  Filled 2023-11-26 – 2023-12-09 (×2): qty 100, 34d supply, fill #0
  Filled 2024-02-03: qty 100, 34d supply, fill #1
  Filled 2024-03-10: qty 100, 34d supply, fill #2
  Filled 2024-04-12: qty 100, 34d supply, fill #3

## 2023-11-26 NOTE — Progress Notes (Signed)
 THERAPIST PROGRESS NOTE  Virtual Visit via Video Note  I connected with Jerome Murphy on 11/26/23 at  8:00 AM EDT by a video enabled telemedicine application and verified that I am speaking with the correct person using two identifiers.  Location: Patient: [facility]  Provider: Providers Home    I discussed the limitations of evaluation and management by telemedicine and the availability of in person appointments. The patient expressed understanding and agreed to proceed.     I discussed the assessment and treatment plan with the patient. The patient was provided an opportunity to ask questions and all were answered. The patient agreed with the plan and demonstrated an understanding of the instructions.   The patient was advised to call back or seek an in-person evaluation if the symptoms worsen or if the condition fails to improve as anticipated.  I provided 40 minutes of non-face-to-face time during this encounter.   Maryagnes Small, LCSW   Participation Level: Active  Behavioral Response: CasualAlertAnxious and Depressed  Type of Therapy: Individual Therapy  Treatment Goals addressed:  Active     OP Depression     LTG: Reduce frequency, intensity, and duration of depression symptoms so that daily functioning is improved     Start:  11/26/23    Expected End:  07/12/24         LTG: Increase coping skills to manage depression and improve ability to perform daily activities     Start:  11/26/23    Expected End:  07/12/24         LTG: Cadence will score less than 10 on the Patient Health Questionnaire (PHQ-9)      Start:  11/26/23    Expected End:  07/12/24         STG: Jerome Murphy will participate in at least 80% of scheduled individual psychotherapy sessions      Start:  11/26/23    Expected End:  07/12/24         STG: Jerome Murphy will identify cognitive patterns and beliefs that support depression     Start:  11/26/23    Expected End:  07/12/24          Work with Jerome Murphy to track symptoms, triggers, and/or skill use through a mood chart, diary card, or journal     Start:  11/26/23         Encourage Jerome Murphy to participate in recovery peer support activities weekly      Start:  11/26/23         Therapist will administer the PHQ-9 at weekly intervals for the next 12 weeks     Start:  11/26/23         Provide Jerome Murphy educational information and reading material on dissociation, its causes, and symptoms     Start:  11/26/23         Work with Jerome Murphy to identify the major components of a recent episode of depression: physical symptoms, major thoughts and images, and major behaviors they experienced     Start:  11/26/23         Jerome Murphy will identify 3 personal goals for managing depression symptoms to work on during the current treatment episode     Start:  11/26/23            ProgressTowards Goals: Initial  Interventions: CBT, Motivational Interviewing, and Supportive  Suicidal/Homicidal: Nowithout intent/plan  Therapist Response:    Jerome Murphy was alert and oriented x 5.  He presented today with depressed,  anxious, irritable mood\affect.  Jerome Murphy engaged well in therapy session and was dressed casually.  He was pleasant, cooperative, maintained good eye contact.  Patient reports primary stressors as illness.  He reports feeling judged by family and friends due to his diabetes.  Jerome Murphy states that his lifestyle is completely changed and he feels left out of things because people judge him for what they believe such as he s doing  "drugs" instead of talking to them about what he is going through.  Patient reports that he was diagnosed with type 2 diabetes to start but has been changed to type 1 diabetes.  Jerome Murphy reports that he is attempting to change his lifestyle such as his diet and exercise but it has proven difficult due to limited resources such as financials.  Patient reports negative self-talk and feeling overall  defeated.  Jerome Murphy states that it is hard to engage in his regular activities due to the pain that he is not because of his neuropathy.  Jerome Murphy reports giving everything he has towards his diagnosis but this does not seem to make things better.  Intervention/plan: LCSW utilized psychoanalytic therapy for patient to express thoughts and feelings in session utilizing nonjudgmental stance.  LCSW utilize CBT for reframing and cognitive restructuring.  LCSW educated patient on the use of gratitude exercises to improve self-worth and hopelessness feelings.  LCSW educated patient on focusing on positive things throughout his day no matter how bigger small.  LCSW and patient spoke about prioritizing himself and focusing on what he can control versus what he cannot control.  LCSW utilize person Center therapy for empowerment.  LCSW utilized motivational interviewing for open-ended questions, reflective listening, and open-ended questions.    Plan: Return again in 4 weeks.  Diagnosis: Adjustment disorder with depressed mood  Anxiety disorder due to general medical condition with panic attack  Collaboration of Care: Other None today   Patient/Guardian was advised Release of Information must be obtained prior to any record release in order to collaborate their care with an outside provider. Patient/Guardian was advised if they have not already done so to contact the registration department to sign all necessary forms in order for us  to release information regarding their care.   Consent: Patient/Guardian gives verbal consent for treatment and assignment of benefits for services provided during this visit. Patient/Guardian expressed understanding and agreed to proceed.   Maryagnes Small, LCSW 11/26/2023

## 2023-11-26 NOTE — Telephone Encounter (Signed)
 Request refill complete

## 2023-12-07 ENCOUNTER — Other Ambulatory Visit: Payer: Self-pay

## 2023-12-08 ENCOUNTER — Other Ambulatory Visit: Payer: Self-pay

## 2023-12-09 ENCOUNTER — Other Ambulatory Visit: Payer: Self-pay

## 2023-12-20 NOTE — Progress Notes (Deleted)
 BH MD Outpatient Progress Note  12/20/2023 9:10 AM Jerome Murphy  MRN:  [id number]  Assessment:  Jerome Murphy presents for follow-up evaluation. Today, 12/20/23, patient reports   --- he did not increase Cymbalta  as previously discussed due to confusion with dosing. Reviewed current psychotropics, dosing schedule, and how to take. Despite not making this increase, he reports noticing more benefit from the Cymbalta  in the last month. Reports improved resiliency in face of acute stressors and shares that supports have noticed this as well. He is using Atarax  as needed about twice daily. Reports he is sleeping better although still disrupted due to issues with his pump; actively working with endocrinologist to rectify this. Identifies significant benefit from work with nutritionist in selecting foods and using moderation to keep sugars controlled. Amenable to continuing medications as he is currently taking them. He is looking forward to establishing with psychotherapist this week.  RTC in 2 months by video.  Identifying Information: Jerome Murphy is a 33 y.o. male with no formal past psychiatric history and medical history of T1DM with neuropathy, asthma, possible seizures in childhood, and Vitamin D  deficiency who is an established patient with [facility]. Patient carries history of severely uncontrolled T1DM with significant weight loss over the past few years both of which have impacted sense of identity, self-worth, and belonging in peer groups. He reports that due to sudden drop in weight, he has frequently been accused by prior supports of abusing substances which has led him to avoid social outings. He additionally reports that fatigue related to hyperglycemia interferes with him engaging with family as desired. While he endorses low energy, motivation, appetite and sleep disturbance, and periods of hopelessness these symptoms are greatly  confounded by uncontrolled T1DM and notably desire to be engaging with family/friends remains intact. He reports considerable anxiety surrounding health and fear of hypoglycemic episodes leading to development of panic attacks and difficulty falling asleep at night. At this time, would conceptualize symptoms as adjustment reaction and anxiety disorder due to medical illness however will continue to monitor for development of MDD.   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   Subjective:  Chief Complaint:  No chief complaint on file.   Interval History:   Verify current meds, Taking gabapentin ? Mood, anxiety Ptsd? Diabetes control; hypoglycemic episodes Sleep therapy   Visit Diagnosis:  No diagnosis found.   Past Psychiatric History:  Diagnoses: no prior formal psychiatric diagnoses Medication trials: denies Previous psychiatrist/therapist: denies Hospitalizations: denies Suicide attempts: denies SIB: denies Hx of violence towards others: denies Current access to guns: yes Hx of trauma/abuse: requires further evaluation Substance use:              -- Etoh: denies             -- Tobacco: 0.5-1 ppd             -- Denies use of illicit drugs including cannabis, CBD/THC, stimulants,  BZDs, hallucinogens  Past Medical History:  Past Medical History:  Diagnosis Date   Asthma     Diabetes mellitus type I (HCC)    Diabetes mellitus without complication (HCC)    DKA (diabetic ketoacidosis) (HCC) 05/18/2022   History of gallstones    Narcolepsy    Seizures (HCC)     Past Surgical History:  Procedure Laterality Date   CIRCUMCISION     HERNIA REPAIR     TONSILLECTOMY      Family Psychiatric History:  Mom: suicide attempt Dad: depression following MI  Family History:  Family History  Problem Relation Age of Onset   Diabetes Mother    Suicidality Mother    Depression Father    Diabetes Maternal Uncle     Social History:  Academic/Vocational: 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   Social History   Socioeconomic History   Marital status: Single    Spouse name: Not on file   Number of children: 2   Years of education: Not on file   Highest education level: Associate degree: occupational, Scientist, product/process development, or vocational program  Occupational History   Occupation: Barbershop  Tobacco Use   Smoking status: Every Day    Current packs/day: 0.50    Types: Cigarettes   Smokeless tobacco: Never  Vaping Use   Vaping status: Never Used  Substance and Sexual Activity   Alcohol use: No    Comment: occasionallly   Drug use: No   Sexual activity: Yes    Partners: Female    Birth control/protection: Condom    Comment: partner also use depo  Other Topics Concern   Not on file  Social History Narrative   Not on file   Social Drivers of Health   Financial Resource Strain: High Risk (10/21/2023)   Overall Financial Resource Strain (CARDIA)    Difficulty of Paying Living Expenses: Very hard  Food Insecurity: No Food Insecurity (09/20/2023)   Hunger Vital Sign    Worried About Running Out of Food in the Last Year: Never true    Ran Out of Food in the Last Year: Never true  Recent Concern: Food Insecurity - Food Insecurity Present (06/30/2023)   Received from [facility]   Hunger Vital Sign    Worried About Running Out of Food in the Last  Year: Often true    Ran Out of Food in the Last Year: Often true  Transportation Needs: Unmet Transportation Needs (10/21/2023)   PRAPARE - Transportation    Lack of Transportation (Medical): Yes    Lack of Transportation (Non-Medical): Yes  Physical Activity: Inactive (10/21/2023)   Exercise Vital Sign    Days of Exercise per Week: 0 days    Minutes of Exercise per Session: 0 min  Stress: Stress Concern Present (10/21/2023)   [employer] of Occupational Health - Occupational Stress Questionnaire    Feeling of Stress : Very much  Social Connections: Moderately Isolated (10/21/2023)   Social Connection and Isolation Panel [NHANES]    Frequency of Communication with Friends and Family: Three times a week    Frequency of Social Gatherings with Friends and Family: Once a week    Attends Religious Services: 1 to 4 times per year    Active Member of [employer] or Organizations: No    Attends Banker Meetings: Never    Marital Status: Never married    Allergies:  Allergies  Allergen Reactions   Robitussin [Guaifenesin] Hives and Itching    Current  Medications: Current Outpatient Medications  Medication Sig Dispense Refill   albuterol  (VENTOLIN  HFA) 108 (90 Base) MCG/ACT inhaler Inhale 2 puffs into the lungs every 6 (six) hours as needed for wheezing or shortness of breath. (Patient not taking: Reported on 09/20/2023) 8 g 2   Continuous Glucose Sensor (DEXCOM G7 SENSOR) MISC use as directed for continuous glucose monitoring 9 each 3   DULoxetine  (CYMBALTA ) 30 MG capsule Take 1 capsule (30 mg total) by mouth 2 (two) times daily. 60 capsule 2   gabapentin  (NEURONTIN ) 300 MG capsule Take 1 capsule (300 mg total) by mouth 3 (three) times daily. 90 capsule 5   hydrOXYzine  (ATARAX ) 25 MG tablet Take 1-2 tablets (25-50 mg total) by mouth 2 (two) times daily as needed (panic attacks or sleep). 120 tablet 2   insulin  aspart (NOVOLOG ) 100 UNIT/ML injection Use via insulin  pump 60 Units  daily. 20 mL 1   Insulin  Disposable Pump (OMNIPOD 5 DEXG7G6 INTRO GEN 5) KIT Use as directed. 1 kit 0   Insulin  Disposable Pump (OMNIPOD 5 DEXG7G6 PODS GEN 5) MISC use as directed change every 3 (three) days. 30 each 3   Insulin  Pen Needle (PEN NEEDLES 3/16") 31G X 5 MM MISC Use 4 times a day 300 each 4   Insulin  Syringe-Needle U-100 (INSULIN  SYRINGE 1CC/31GX5/16") 31G X 5/16" 1 ML MISC Use as directed daily. 100 each 3   No current facility-administered medications for this visit.    ROS: As outlined above  Objective:  Psychiatric Specialty Exam: There were no vitals taken for this visit.There is no height or weight on file to calculate BMI.  General Appearance: Casual and Fairly Groomed  Eye Contact:  Good  Speech:  Clear and Coherent and Normal Rate  Volume:  Normal  Mood:  "more calm"  Affect:  Euthymic; calm  Thought Content: Denies AVH; no overt delusional thought content on interview     Suicidal Thoughts:  No  Homicidal Thoughts:  No  Thought Process:  Goal Directed and Linear  Orientation:  Full (Time, Place, and Person)    Memory:  Grossly intact  Judgment:  Good  Insight:  Good  Concentration:  Concentration: Good  Recall:  not formally assessed   Fund of Knowledge: Fair  Language: Good  Psychomotor Activity:  Normal  Akathisia:  NA  AIMS (if indicated): NA  Assets:  Communication Skills Desire for Improvement Housing Intimacy Resilience Transportation  ADL's:  Intact  Cognition: WNL  Sleep:  improving   PE: General: sits comfortably in view of camera; no acute distress  Pulm: no increased work of breathing on room air  MSK: all extremity movements appear intact  Neuro: no focal neurological deficits observed  Gait & Station: unable to assess by video    Metabolic Disorder Labs: Lab Results  Component Value Date   HGBA1C 12.6 (A) 09/23/2023   MPG 369 02/10/2023   MPG 352.23 05/18/2022   No results found for: "PROLACTIN" Lab Results  Component  Value Date   CHOL 132 08/28/2020   TRIG 63 08/28/2020   HDL 37 (L) 08/28/2020   CHOLHDL 3.6 08/28/2020   LDLCALC 82 08/28/2020   LDLCALC 107 (H) 08/29/2019   Lab Results  Component Value Date   TSH 0.973 06/15/2023   TSH 1.130 08/28/2020    Therapeutic Level Labs: No results found for: "LITHIUM" No results found for: "VALPROATE" No results found for: "CBMZ"  Screenings:  GAD-7    Flowsheet Row Counselor from 10/21/2023 in [place]  [facility] Office Visit from 06/15/2023 in [facility] [place] - A Dept Of [place]. [facility] Office Visit from 05/08/2023 in [facility] [place] - A Dept Of [place]. [facility] Office Visit from 02/18/2023 in [facility] - A Dept Of [facility]. [facility] Office Visit from 11/25/2020 in [facility]  Total GAD-7 Score 9 8 6 1  0      PHQ2-9    Flowsheet Row Counselor from 10/21/2023 in [facility] Office Visit from 06/15/2023 in [facility] [place] - A Dept Of [place]. [facility] Health from 06/14/2023 in [facility] [place] - A Dept Of [place]. [facility] Office Visit from 05/08/2023 in [facility] [place] - A Dept Of [place]. [facility] Nutrition from 03/29/2023 in [facility]  - A Dept Of [place]. [facility]  PHQ-2 Total Score 3 4 4 4  0  PHQ-9 Total Score 10 10 10 9  --      Flowsheet Row Counselor from 10/21/2023 in [facility] ED to Hosp-Admission (Discharged) from 09/20/2023 in [facility] (1A[facility] ED from 03/18/2023 in [facility] Department at [facility]  C-SSRS RISK CATEGORY No Risk No Risk No Risk       Collaboration of Care: Collaboration of Care: Medication Management AEB active medication management, Psychiatrist AEB  established with this provider, and Referral or follow-up with counselor/therapist AEB patient to get rescheduled for individual therapy  Patient/Guardian was advised Release of Information must be obtained prior to any record release in order to collaborate their care with an outside provider. Patient/Guardian was advised if they have not already done so to contact the registration department to sign all necessary forms in order for us  to release information regarding their care.   Consent: Patient/Guardian gives verbal consent for treatment and assignment of benefits for services provided during this visit. Patient/Guardian expressed understanding and agreed to proceed.   Televisit via video: I connected with patient on 12/20/23 at  9:30 AM EDT by a video enabled telemedicine application and verified that I am speaking with the correct person using two identifiers.  Location: Patient: home address in [place] Provider: remote office in [place]   I discussed the limitations of evaluation and management by telemedicine and the availability of in person appointments. The patient expressed understanding and agreed to proceed.  I discussed the assessment and treatment plan with the patient. The patient was provided an opportunity to ask questions and all were answered. The patient agreed with the plan and demonstrated an understanding of the instructions.   The patient was advised to call back or seek an in-person evaluation if the symptoms worsen or if the condition fails to improve as anticipated.  I provided *** minutes dedicated to the care of this patient via video on the date of this encounter to include chart review, face-to-face time with the patient, medication management/counseling, documentation.  Genova Kiner A Daryl Quiros 12/20/2023, 9:10 AM

## 2023-12-22 ENCOUNTER — Telehealth (HOSPITAL_COMMUNITY): Admitting: Psychiatry

## 2023-12-22 ENCOUNTER — Encounter (HOSPITAL_COMMUNITY): Payer: Self-pay

## 2023-12-22 ENCOUNTER — Other Ambulatory Visit: Payer: Self-pay

## 2023-12-24 ENCOUNTER — Ambulatory Visit (INDEPENDENT_AMBULATORY_CARE_PROVIDER_SITE_OTHER): Admitting: Licensed Clinical Social Worker

## 2023-12-24 DIAGNOSIS — F410 Panic disorder [episodic paroxysmal anxiety] without agoraphobia: Secondary | ICD-10-CM

## 2023-12-24 DIAGNOSIS — F321 Major depressive disorder, single episode, moderate: Secondary | ICD-10-CM | POA: Diagnosis not present

## 2023-12-24 DIAGNOSIS — F064 Anxiety disorder due to known physiological condition: Secondary | ICD-10-CM

## 2023-12-24 NOTE — Progress Notes (Addendum)
 THERAPIST PROGRESS NOTE  Virtual Visit via Video Note  I connected with Jerome Murphy on 12/24/23 at  8:00 AM EDT by a video enabled telemedicine application and verified that I am speaking with the correct person using two identifiers.  Location: [facility]: [facility]  Provider: Provider Home    I discussed the limitations of evaluation and management by telemedicine and the availability of in person appointments. The patient expressed understanding and agreed to proceed.     I discussed the assessment and treatment plan with the patient. The patient was provided an opportunity to ask questions and all were answered. The patient agreed with the plan and demonstrated an understanding of the instructions.   The patient was advised to call back or seek an in-person evaluation if the symptoms worsen or if the condition fails to improve as anticipated.  I provided 55 minutes of non-face-to-face time during this encounter.   Jerome Small, LCSW   Participation Level: Active  Behavioral Response: CasualAlertAnxious and Depressed  Type of Therapy: Individual Therapy  Treatment Goals addressed:  Active     OP Depression     LTG: Reduce frequency, intensity, and duration of depression symptoms so that daily functioning is improved (Progressing)     Start:  11/26/23    Expected End:  07/12/24         LTG: Increase coping skills to manage depression and improve ability to perform daily activities (Progressing)     Start:  11/26/23    Expected End:  07/12/24         LTG: Ike will score less than 10 on the Patient Health Questionnaire (PHQ-9)      Start:  11/26/23    Expected End:  07/12/24         STG: Jerome Murphy will participate in at least 80% of scheduled individual psychotherapy sessions  (Progressing)     Start:  11/26/23    Expected End:  07/12/24         STG: Jerome Murphy will identify cognitive patterns and beliefs that support depression  (Progressing)     Start:  11/26/23    Expected End:  07/12/24         Therapist will administer the PHQ-9 at weekly intervals for the next 12 weeks     Start:  11/26/23         Jerome Murphy will identify 3 personal goals for managing depression symptoms to work on during the current treatment episode     Start:  11/26/23         Work with Jerome Murphy to track symptoms, triggers, and/or skill use through a mood chart, diary card, or journal (Completed)     Start:  11/26/23    End:  12/24/23      Encourage Jerome Murphy to participate in recovery peer support activities weekly  (Completed)     Start:  11/26/23    End:  12/24/23      Provide Jerome Murphy educational information and reading material on dissociation, its causes, and symptoms (Completed)     Start:  11/26/23    End:  12/24/23      Work with Jerome Murphy to identify the major components of a recent episode of depression: physical symptoms, major thoughts and images, and major behaviors they experienced (Completed)     Start:  11/26/23    End:  12/24/23         ProgressTowards Goals: Progressing  Interventions: CBT, Motivational Interviewing, and Supportive  Suicidal/Homicidal: Nowithout  intent/plan  Therapist Response:  Jerome Murphy was alert and oriented x 5.  He was pleasant, cooperative and maintained good eye contact. Pt engaged well in therapy session and was dressed casually. He presented with depressed mood with depressed affect.    Jerome Murphy reports stressor for self-esteem, low confidence, and illness. Pt struggles with setting boundaries with family as they make jokes about his appearance. Jerome Murphy struggles with his current body image since losing weight due to his diabetes. People make comments such as he is a drug addict. Jerome Murphy that he just wants to be able to get back to his old self and being confident with the person her currently is. He endorses symptoms for worthlessness, hopelessness, fatigue, tension, worry, and lack of  motivation. Jerome Murphy continues to judge himself based on what he was doing prior to having diabetes and the energy he once had.   Intervention/Plan: LCSW used psychoanalytic therapy for pt to express thoughts, feeling and concerns. LCSW used supportive therapy for praise and encouragement. LCSW educated pt on "Growth Mindset". LCSW used education positive affirmation to help improve self-esteem. LCSW used education on gratitude exercises to help improve depressive symptoms for worthlessness and hopelessness feelings.     Plan: Return again in 4 weeks.  Diagnosis: Major depressive disorder, single episode, moderate (HCC)  Anxiety disorder due to general medical condition with panic attack  Collaboration of Care: Other None today   Patient/Guardian was advised Release of Information must be obtained prior to any record release in order to collaborate their care with an outside provider. Patient/Guardian was advised if they have not already done so to contact the registration department to sign all necessary forms in order for us  to release information regarding their care.   Consent: Patient/Guardian gives verbal consent for treatment and assignment of benefits for services provided during this visit. Patient/Guardian expressed understanding and agreed to proceed.   Jerome Small, LCSW 12/24/2023

## 2023-12-25 ENCOUNTER — Other Ambulatory Visit: Payer: Self-pay

## 2023-12-27 ENCOUNTER — Other Ambulatory Visit: Payer: Self-pay

## 2023-12-30 ENCOUNTER — Ambulatory Visit (HOSPITAL_COMMUNITY): Admitting: Licensed Clinical Social Worker

## 2024-01-18 ENCOUNTER — Other Ambulatory Visit: Payer: Self-pay

## 2024-01-21 ENCOUNTER — Ambulatory Visit (HOSPITAL_COMMUNITY): Admitting: Licensed Clinical Social Worker

## 2024-01-24 NOTE — Progress Notes (Unsigned)
 BH MD Outpatient Progress Note  01/26/2024 12:18 PM Jerome Murphy  MRN:  [id number]  Assessment:  Jerome Murphy presents for follow-up evaluation. Today, 01/26/24, patient reports improving mood and anxiety and reflects on how therapy has helped provide new perspective on accepting change to lifestyle and identity - notes I've got to learn to walk in this new pair of shoes. He reports improved resiliency in face of acute stressors and is working on giving himself credit for steps forward he has made. He reports self-reduction in Cymbalta  due to concern for emotional dulling at higher dose; given current benefit and tolerability will maintain at current dosing for time being.  RTC in 2 months by video.  Identifying Information: Jerome Murphy is a 33 y.o. male with no formal past psychiatric history and medical history of T1DM with neuropathy, asthma, possible seizures in childhood, and Vitamin D  deficiency who is an established patient with [facility]. Patient carries history of severely uncontrolled T1DM with significant weight loss over the past few years both of which have impacted sense of identity, self-worth, and belonging in peer groups. He reports that due to sudden drop in weight, he has frequently been accused by prior supports of abusing substances which has led him to avoid social outings. He additionally reports that fatigue related to hyperglycemia interferes with him engaging with family as desired. While he endorses low energy, motivation, appetite and sleep disturbance, and periods of hopelessness these symptoms are greatly confounded by uncontrolled T1DM and notably desire to be engaging with family/friends remains intact. He reports considerable anxiety surrounding health and fear of hypoglycemic episodes leading to development of panic attacks and difficulty falling asleep at night. At this time, would conceptualize symptoms as  adjustment reaction and anxiety disorder due to medical illness however will continue to monitor for development of MDD.   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 Past medication trials: none Status of problem: improving Interventions: -- Continue Cymbalta  30 mg daily (s12/18/24)  -- Patient self-decreased from 30 mg BID as he felt higher dosing may have led to emotional dulling -- Renal function including GFR wnl 03/18/23 -- Continue Atarax  25-50 mg BID PRN panic attacks/sleep -- Prescribed gabapentin  300 mg TID by PCP -- Continue individual psychotherapy with Adam Goldammer LCSW -- Patient will benefit from ongoing management with nutritionist  Patient was given contact information for behavioral health clinic and was instructed to call 911 for emergencies.   Subjective:  Chief Complaint:  Chief Complaint  Patient presents with   Medication Management    Interval History:   Jerome Murphy reports he is here this morning - keeping up with therapy appointments and has found this helpful. Learning a lot about himself; has realized he has a new set of shoes he needs to learn how to walk in. He reflects on change in identity and goals. Discusses stigma against men engaging in mental health. Mood has been alright most days although may feel more down on holidays. Not feeling as overwhelmed or tearful as before. Endorses intact motivation. Denies passive/active SI.   Reports adherence to Cymbalta  however reduced to 1 capsule daily as he felt twice daily dosing made him feel too chill. Mom mentioned that on higher dose he was able to bounce back a bit easier. Would like to remain at current dosing for time being.   Using hydroxyzine  mostly at night to help with sleep;  occasionally during the day. Has reduced usage of energy drinks.   BS has been better controlled although notes some spikes in the evenings. Continues to meet  with nutritionist.   Visit Diagnosis:    ICD-10-CM   1. Adjustment disorder with depressed mood  F43.21     2. Anxiety disorder due to general medical condition with panic attack  F06.4    F41.0      Past Psychiatric History:  Diagnoses: no prior formal psychiatric diagnoses Medication trials: denies Previous psychiatrist/therapist: denies Hospitalizations: denies Suicide attempts: denies SIB: denies Hx of violence towards others: denies Current access to guns: yes Hx of trauma/abuse: requires further evaluation Substance use:              -- Etoh: denies             -- Tobacco: 0.5-1 ppd             -- Denies use of illicit drugs including cannabis, CBD/THC, stimulants, BZDs, hallucinogens  Past Medical History:  Past Medical History:  Diagnosis Date   Asthma    Diabetes mellitus type I (HCC)    Diabetes mellitus without complication (HCC)    DKA (diabetic ketoacidosis) (HCC) 05/18/2022   History of gallstones    Narcolepsy    Seizures (HCC)     Past Surgical History:  Procedure Laterality Date   CIRCUMCISION     HERNIA REPAIR     TONSILLECTOMY      Family Psychiatric History:  Mom: suicide attempt Dad: depression following MI  Family History:  Family History  Problem Relation Age of Onset   Diabetes Mother    Suicidality Mother    Depression Father    Diabetes Maternal Uncle     Social History:  Academic/Vocational: 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   Social History   Socioeconomic History   Marital status: Single    Spouse name: Not on file   Number of children: 2   Years of education: Not on file   Highest education level: Associate degree: occupational, Scientist, product/process development, or vocational program  Occupational History   Occupation: Barbershop  Tobacco Use   Smoking status: Every Day    Current packs/day: 0.50    Types: Cigarettes   Smokeless tobacco: Never  Vaping Use   Vaping status: Never Used  Substance and Sexual Activity   Alcohol use:  No    Comment: occasionallly   Drug use: No   Sexual activity: Yes    Partners: Female    Birth control/protection: Condom    Comment: partner also use depo  Other Topics Concern   Not on file  Social History Narrative   Not on file   Social Drivers of Health   Financial Resource Strain: High Risk (10/21/2023)   Overall Financial Resource Strain (CARDIA)    Difficulty of Paying Living Expenses: Very hard  Food Insecurity: No Food Insecurity (09/20/2023)   Hunger Vital Sign    Worried About Running Out of Food in the Last Year: Never true    Ran Out of Food in the Last Year: Never true  Recent Concern: Food Insecurity - Food Insecurity Present (06/30/2023)   Received from [facility]   Hunger Vital Sign    Within the past 12 months, you worried that your food would run out before you got the money to buy more.: Often true    Within the past 12 months, the food you bought just didn't last and you  didn't have money to get more.: Often true  Transportation Needs: Unmet Transportation Needs (10/21/2023)   PRAPARE - Transportation    Lack of Transportation (Medical): Yes    Lack of Transportation (Non-Medical): Yes  Physical Activity: Inactive (10/21/2023)   Exercise Vital Sign    Days of Exercise per Week: 0 days    Minutes of Exercise per Session: 0 min  Stress: Stress Concern Present (10/21/2023)   [employer] of Occupational Health - Occupational Stress Questionnaire    Feeling of Stress : Very much  Social Connections: Moderately Isolated (10/21/2023)   Social Connection and Isolation Panel    Frequency of Communication with Friends and Family: Three times a week    Frequency of Social Gatherings with Friends and Family: Once a week    Attends Religious Services: 1 to 4 times per year    Active Member of [employer] or Organizations: No    Attends Banker Meetings: Never    Marital Status: Never married    Allergies:  Allergies  Allergen  Reactions   Robitussin [Guaifenesin] Hives and Itching    Current Medications: Current Outpatient Medications  Medication Sig Dispense Refill   albuterol  (VENTOLIN  HFA) 108 (90 Base) MCG/ACT inhaler Inhale 2 puffs into the lungs every 6 (six) hours as needed for wheezing or shortness of breath. (Patient not taking: Reported on 09/20/2023) 8 g 2   Continuous Glucose Sensor (DEXCOM G7 SENSOR) MISC use as directed for continuous glucose monitoring 9 each 3   DULoxetine  (CYMBALTA ) 30 MG capsule Take 1 capsule (30 mg total) by mouth daily. 30 capsule 2   gabapentin  (NEURONTIN ) 300 MG capsule Take 1 capsule (300 mg total) by mouth 3 (three) times daily. 90 capsule 5   hydrOXYzine  (ATARAX ) 25 MG tablet Take 1-2 tablets (25-50 mg total) by mouth 2 (two) times daily as needed for anxiety (sleep). 60 tablet 2   insulin  aspart (NOVOLOG ) 100 UNIT/ML injection Use via insulin  pump 60 Units daily. 20 mL 1   Insulin  Disposable Pump (OMNIPOD 5 DEXG7G6 INTRO GEN 5) KIT Use as directed. 1 kit 0   Insulin  Disposable Pump (OMNIPOD 5 DEXG7G6 PODS GEN 5) MISC use as directed change every 3 (three) days. 30 each 3   Insulin  Pen Needle (PEN NEEDLES 3/16) 31G X 5 MM MISC Use 4 times a day 300 each 4   Insulin  Syringe-Needle U-100 (INSULIN  SYRINGE 1CC/31GX5/16) 31G X 5/16 1 ML MISC Use as directed daily. 100 each 3   No current facility-administered medications for this visit.    ROS: See above  Objective:  Psychiatric Specialty Exam: There were no vitals taken for this visit.There is no height or weight on file to calculate BMI.  General Appearance: Casual and Fairly Groomed  Eye Contact:  Good  Speech:  Clear and Coherent and Normal Rate  Volume:  Normal  Mood:  alright  Affect:  Euthymic; calm  Thought Content: Denies AVH; no overt delusional thought content on interview     Suicidal Thoughts:  No  Homicidal Thoughts:  No  Thought Process:  Goal Directed and Linear  Orientation:  Full (Time,  Place, and Person)    Memory:  Grossly intact  Judgment:  Good  Insight:  Good  Concentration:  Concentration: Good  Recall:  not formally assessed   Fund of Knowledge: Fair  Language: Good  Psychomotor Activity:  Normal  Akathisia:  NA  AIMS (if indicated): NA  Assets:  Communication Skills Desire for Improvement Housing Intimacy  Resilience Transportation  ADL's:  Intact  Cognition: WNL  Sleep:  improving   PE: General: sits comfortably in view of camera; no acute distress  Pulm: no increased work of breathing on room air  MSK: all extremity movements appear intact  Neuro: no focal neurological deficits observed  Gait & Station: unable to assess by video    Metabolic Disorder Labs: Lab Results  Component Value Date   HGBA1C 12.6 (A) 09/23/2023   MPG 369 02/10/2023   MPG 352.23 05/18/2022   No results found for: PROLACTIN Lab Results  Component Value Date   CHOL 132 08/28/2020   TRIG 63 08/28/2020   HDL 37 (L) 08/28/2020   CHOLHDL 3.6 08/28/2020   LDLCALC 82 08/28/2020   LDLCALC 107 (H) 08/29/2019   Lab Results  Component Value Date   TSH 0.973 06/15/2023   TSH 1.130 08/28/2020    Therapeutic Level Labs: No results found for: LITHIUM No results found for: VALPROATE No results found for: CBMZ  Screenings:  GAD-7    Flowsheet Row Counselor from 10/21/2023 in [facility] Office Visit from 06/15/2023 in [facility] [place] - A Dept Of [place]. [facility] Office Visit from 05/08/2023 in [facility] [place] - A Dept Of [place]. [facility] Office Visit from 02/18/2023 in [facility] - A Dept Of [place]. [facility] Office Visit from 11/25/2020 in [facility]  Total GAD-7 Score 9 8 6 1  0   PHQ2-9    Flowsheet Row Counselor from 10/21/2023 in [facility] Office Visit from 06/15/2023 in [facility] [place]  - A Dept Of [place]. [facility] Integrated [facility]al Health from 06/14/2023 in [facility] [place] - A Dept Of [place]. [facility] Office Visit from 05/08/2023 in [facility] [place] - A Dept Of . [facility] Nutrition from 03/29/2023 in [facility]  - A Dept Of . [facility]  PHQ-2 Total Score 3 4 4 4  0  PHQ-9 Total Score 10 10 10 9  --   Flowsheet Row Counselor from 10/21/2023 in [facility] ED to Hosp-Admission (Discharged) from 09/20/2023 in [facility] (1A) ED from 03/18/2023 in [facility] Emergency Department at [facility]  C-SSRS RISK CATEGORY No Risk No Risk No Risk    Collaboration of Care: Collaboration of Care: Medication Management AEB active medication management, Psychiatrist AEB established with this provider, and Referral or follow-up with counselor/therapist AEB patient to get rescheduled for individual therapy  Patient/Guardian was advised Release of Information must be obtained prior to any record release in order to collaborate their care with an outside provider. Patient/Guardian was advised if they have not already done so to contact the registration department to sign all necessary forms in order for us  to release information regarding their care.   Consent: Patient/Guardian gives verbal consent for treatment and assignment of benefits for services provided during this visit. Patient/Guardian expressed understanding and agreed to proceed.   Televisit via video: I connected with patient on 01/26/24 at 11:30 AM EDT by a video enabled telemedicine application and verified that I am speaking with the correct person using two identifiers.  Location: Patient: home address in [place] Provider: remote office in [place]   I discussed the limitations of evaluation and management by telemedicine and the  availability of in person  appointments. The patient expressed understanding and agreed to proceed.  I discussed the assessment and treatment plan with the patient. The patient was provided an opportunity to ask questions and all were answered. The patient agreed with the plan and demonstrated an understanding of the instructions.   The patient was advised to call back or seek an in-person evaluation if the symptoms worsen or if the condition fails to improve as anticipated.  I provided 40 minutes dedicated to the care of this patient via video on the date of this encounter to include chart review, face-to-face time with the patient, medication management/counseling, documentation.  Psychotherapy was utilized during today's session from 11:40AM-12:00PM. Therapeutic interventions included empathic listening, supportive therapy, cognitive therapy. Used supportive interviewing techniques to provide emotional validation. Worked on cognitive reframing techniques.  Improvement was evidenced by patient's participation and identified commitment to therapy goals.   Jerome Murphy A Jaquavis Felmlee 01/26/2024, 12:18 PM

## 2024-01-26 ENCOUNTER — Telehealth (INDEPENDENT_AMBULATORY_CARE_PROVIDER_SITE_OTHER): Admitting: Psychiatry

## 2024-01-26 ENCOUNTER — Encounter (HOSPITAL_COMMUNITY): Payer: Self-pay | Admitting: Psychiatry

## 2024-01-26 ENCOUNTER — Other Ambulatory Visit: Payer: Self-pay

## 2024-01-26 DIAGNOSIS — E109 Type 1 diabetes mellitus without complications: Secondary | ICD-10-CM | POA: Diagnosis not present

## 2024-01-26 DIAGNOSIS — F4321 Adjustment disorder with depressed mood: Secondary | ICD-10-CM | POA: Diagnosis not present

## 2024-01-26 DIAGNOSIS — F064 Anxiety disorder due to known physiological condition: Secondary | ICD-10-CM

## 2024-01-26 DIAGNOSIS — F410 Panic disorder [episodic paroxysmal anxiety] without agoraphobia: Secondary | ICD-10-CM | POA: Diagnosis not present

## 2024-01-26 MED ORDER — DULOXETINE HCL 30 MG PO CPEP
30.0000 mg | ORAL_CAPSULE | Freq: Every day | ORAL | 2 refills | Status: AC
  Filled 2024-01-26 – 2024-02-09 (×2): qty 30, 30d supply, fill #0
  Filled 2024-03-10: qty 30, 30d supply, fill #1
  Filled 2024-04-12: qty 30, 30d supply, fill #2

## 2024-01-26 MED ORDER — HYDROXYZINE HCL 25 MG PO TABS
25.0000 mg | ORAL_TABLET | Freq: Two times a day (BID) | ORAL | 2 refills | Status: AC | PRN
  Filled 2024-01-26 – 2024-02-09 (×2): qty 60, 15d supply, fill #0
  Filled 2024-03-10: qty 60, 15d supply, fill #1
  Filled 2024-05-22: qty 60, 15d supply, fill #2

## 2024-01-26 NOTE — Patient Instructions (Signed)

## 2024-02-03 ENCOUNTER — Other Ambulatory Visit: Payer: Self-pay | Admitting: Endocrinology

## 2024-02-03 ENCOUNTER — Other Ambulatory Visit: Payer: Self-pay

## 2024-02-03 DIAGNOSIS — E1029 Type 1 diabetes mellitus with other diabetic kidney complication: Secondary | ICD-10-CM

## 2024-02-03 MED ORDER — INSULIN ASPART 100 UNIT/ML IJ SOLN
60.0000 [IU] | Freq: Every day | INTRAMUSCULAR | 1 refills | Status: DC
  Filled 2024-02-03: qty 20, 33d supply, fill #0
  Filled 2024-03-10: qty 20, 33d supply, fill #1

## 2024-02-04 ENCOUNTER — Telehealth: Payer: Self-pay | Admitting: Dietician

## 2024-02-04 ENCOUNTER — Other Ambulatory Visit: Payer: Self-pay

## 2024-02-04 NOTE — Telephone Encounter (Signed)
 Called patient and left a message that his Novolog  was sent in to his pharmacy.  Also, called and spoke to his mother and informed her of this.  Leita Constable, RD, LDN, CDCES, DipACLM

## 2024-02-08 ENCOUNTER — Other Ambulatory Visit: Payer: Self-pay

## 2024-02-09 ENCOUNTER — Other Ambulatory Visit: Payer: Self-pay

## 2024-02-10 ENCOUNTER — Encounter (HOSPITAL_COMMUNITY): Payer: Self-pay

## 2024-02-10 ENCOUNTER — Telehealth (HOSPITAL_COMMUNITY): Payer: Self-pay | Admitting: Licensed Clinical Social Worker

## 2024-02-10 ENCOUNTER — Ambulatory Visit (HOSPITAL_COMMUNITY): Admitting: Licensed Clinical Social Worker

## 2024-02-10 NOTE — Telephone Encounter (Signed)
 LCSW sent two links to pt phone with no response for 10am appointment. LCSW f/u with a phone call and left HIPAA compliant VM. LCSW waited until 1030 before disconnecting.

## 2024-02-15 ENCOUNTER — Other Ambulatory Visit: Payer: Self-pay

## 2024-02-16 ENCOUNTER — Telehealth: Payer: Self-pay | Admitting: Internal Medicine

## 2024-02-16 ENCOUNTER — Other Ambulatory Visit (HOSPITAL_COMMUNITY): Payer: Self-pay

## 2024-02-16 ENCOUNTER — Telehealth: Payer: Self-pay | Admitting: Nutrition

## 2024-02-16 ENCOUNTER — Telehealth: Payer: Self-pay

## 2024-02-16 DIAGNOSIS — E1042 Type 1 diabetes mellitus with diabetic polyneuropathy: Secondary | ICD-10-CM

## 2024-02-16 NOTE — Telephone Encounter (Signed)
 Pharmacy Patient Advocate Encounter  Received notification from [facility] that Prior Authorization for Dexcom G7 sensor has been APPROVED from 02/16/24 to 08/14/24

## 2024-02-16 NOTE — Telephone Encounter (Signed)
 Copied from CRM [id number]. Topic: General - Other >> Feb 16, 2024 10:40 AM Sophia H wrote: Reason for CRM: Patient states he feels he has been having issues with his vision and thinks it may be related to his diabetes, wants to know if his eyes can be checked in clinic or if he needs to see a specialist for this. Please advise # [phone]

## 2024-02-16 NOTE — Telephone Encounter (Signed)
 Pharmacy Patient Advocate Encounter   Received notification from Pt Calls Messages that prior authorization for Dexcom G7 sensor is required/requested.   Insurance verification completed.   The patient is insured through [facility] .   Per test claim: PA required; PA submitted to above mentioned insurance via CoverMyMeds Key/confirmation #/EOC [facility] Status is pending

## 2024-02-16 NOTE — Telephone Encounter (Signed)
 Patient is requesting referral to eye doctor for DM eye exam.

## 2024-02-16 NOTE — Telephone Encounter (Signed)
 Message left on my machine that patient says he is needing a prior auth for his Dexcom G7 sensors.

## 2024-02-17 NOTE — Addendum Note (Signed)
 Addended by: Yanet Balliet on: 02/17/2024 12:41 PM   Modules accepted: Orders

## 2024-02-17 NOTE — Telephone Encounter (Signed)
 Referral has been placed.

## 2024-02-17 NOTE — Telephone Encounter (Signed)
LVM informing patient that referral has been placed.

## 2024-03-10 ENCOUNTER — Other Ambulatory Visit: Payer: Self-pay

## 2024-03-20 ENCOUNTER — Telehealth: Payer: Self-pay | Admitting: Internal Medicine

## 2024-03-20 NOTE — Telephone Encounter (Signed)
 Called pt to confirm appt LVM

## 2024-03-21 ENCOUNTER — Ambulatory Visit: Admitting: Internal Medicine

## 2024-03-22 ENCOUNTER — Other Ambulatory Visit: Payer: Self-pay

## 2024-03-27 NOTE — Progress Notes (Unsigned)
 Patient did not connect for virtual psychiatric medication management appointment on 03/29/24 at 9AM. Sent secure video link with no response. Called phone with no answer; unable to leave VM.  LAURAINE DELENA PUMMEL, MD 03/29/24

## 2024-03-29 ENCOUNTER — Encounter (HOSPITAL_COMMUNITY): Admitting: Psychiatry

## 2024-03-29 ENCOUNTER — Encounter (HOSPITAL_COMMUNITY): Payer: Self-pay

## 2024-04-12 ENCOUNTER — Other Ambulatory Visit: Payer: Self-pay

## 2024-04-12 ENCOUNTER — Telehealth: Payer: Self-pay

## 2024-04-12 ENCOUNTER — Other Ambulatory Visit: Payer: Self-pay | Admitting: Endocrinology

## 2024-04-12 DIAGNOSIS — R809 Proteinuria, unspecified: Secondary | ICD-10-CM

## 2024-04-12 MED ORDER — INSULIN ASPART 100 UNIT/ML IJ SOLN
60.0000 [IU] | Freq: Every day | INTRAMUSCULAR | 1 refills | Status: DC
  Filled 2024-04-12: qty 20, 33d supply, fill #0
  Filled 2024-05-22 (×3): qty 20, 33d supply, fill #1

## 2024-04-12 NOTE — Telephone Encounter (Signed)
 Patient has not been seen in over 6 months, pharmacy requested refill. Patient called to schedule appointment with office. Patient transferred to front desk. Medication filled until patient visit.

## 2024-04-13 ENCOUNTER — Other Ambulatory Visit: Payer: Self-pay

## 2024-04-17 ENCOUNTER — Ambulatory Visit: Admitting: Endocrinology

## 2024-04-17 ENCOUNTER — Ambulatory Visit: Payer: Self-pay | Admitting: Endocrinology

## 2024-04-17 ENCOUNTER — Encounter: Payer: Self-pay | Admitting: Endocrinology

## 2024-04-17 VITALS — BP 124/82 | HR 121 | Resp 20 | Ht 71.0 in | Wt 142.2 lb

## 2024-04-17 DIAGNOSIS — E1029 Type 1 diabetes mellitus with other diabetic kidney complication: Secondary | ICD-10-CM

## 2024-04-17 DIAGNOSIS — R809 Proteinuria, unspecified: Secondary | ICD-10-CM | POA: Diagnosis not present

## 2024-04-17 LAB — POCT GLYCOSYLATED HEMOGLOBIN (HGB A1C): Hemoglobin A1C: 13.3 % — AB (ref 4.0–5.6)

## 2024-04-17 NOTE — Progress Notes (Unsigned)
 Outpatient Endocrinology Note Jerome Delphia Kaylor, MD  04/19/24  Patient's Name: Jerome Murphy    DOB: 1991-03-06    MRN: [id number]                                                    REASON OF VISIT: Follow-up of type 1 diabetes mellitus  REFERRING PROVIDER: Brien Belvie BRAVO, MD  PCP: Vicci Barnie NOVAK, MD  HISTORY OF PRESENT ILLNESS:   Jerome Murphy is a 33 y.o. old male with past medical history listed below, is here for follow-up of type 1 diabetes mellitus.   Pertinent Diabetes History: Patient was diagnosed with diabetes mellitus initially treated as type II in May 2019.  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  He was initially treated with metformin  and later insulin  therapy was added, compliance had been the issue in the past.  He has uncontrolled type 1 diabetes mellitus hemoglobin A1c in the range of 8 to 14.5%.    Latest Reference Range & Units 05/18/22 20:00  Insulin  Antibodies, Human uU/mL 46 (H)  Glutamic Acid Decarb Ab 0.0 - 5.0 U/mL 1,013.1 (H)  C-Peptide 1.1 - 4.4 ng/mL 0.4 (L)  (H): Data is abnormally high (L): Data is abnormally low  Insulin  pump OmniPod 5 was started in end of October 2024.   Chronic Diabetes Complications : Retinopathy: unknown. Last ophthalmology exam was done on Due Nephropathy: microalbuminuria + Peripheral neuropathy: yes, on gabapentin  Coronary artery disease: no Stroke: no  Relevant comorbidities and cardiovascular risk factors: Obesity: no Body mass index is 19.83 kg/m.  Hypertension: no Hyperlipidemia. no  Current / Home Diabetic regimen includes:  Has not been using OmniPod insulin  pump at this time he reports because of not having Dexcom sensor.  Reports currently taking NovoLog  15 units 1-3 times a day for meals.  He is not taking basal  insulin .  He was using as of last visit. OmniPod 5 with DEXCOM G7, using Novolog  U100.  Pump setting:  Basal rate: 12 AM - 1.0. 7.0AM - 1.2 10 AM -  1.0  Correction factor : 1:45  Carb ratio : 1:1, ( small meal carb count 2, medium meal carb count 4 and large meals carb count 6). He does not know carb counting.  Target 130-160  Active insulin  time : 4 hr  Prior diabetic medications: Basal bolus regimen: Lantus  30 units in the morning. Novolog  5-10 units with meals when eating plus sliding scale.  Metformin .  Humalog  mix 75/25  CONTINUOUS GLUCOSE MONITORING SYSTEM (CGMS) INTERPRETATION:  No pump data or glucose data available to review.    Hypoglycemia: Patient has no hypoglycemic episodes. Patient has hypoglycemia awareness.  Factors modifying glucose control: 1.  Diabetic diet assessment: three meals a day, started to eat more of vegetable / greens and meats, Gatorade zero. Cut down on potato, starch but still eating breads.   2.  Staying active or exercising: active at work  3.  Medication compliance: compliant most of the time.  Interval history  No glucose data to review.  He was not using Dexcom for few weeks and restarted it 1 week ago.  He had problem refilling Dexcom , and also due  to backorder he was not able to refill and now he recently received after prior authorization.  Downloaded Dexcom data only have blood sugar for September 21 for 2 hours around 4 AM to 6 AM.  in the range of 250s to 300 at that time.  Reports no recent hospitalization due to DKA.  Currently not using insulin  pump.  He has only been using NovoLog  about 15 units 1-3 times a day.  Hemoglobin A1c remains high at 13.3% today.  Patient reports he sometimes has connectivity with Dexcom and getting alerts, discussed and advised to continue follow-up with diabetic educator for any troubleshooting and technical issue with pump.  Patient also advised to call OmniPod representative.  REVIEW  OF SYSTEMS As per history of present illness.   PAST MEDICAL HISTORY: Past Medical History:  Diagnosis Date   Asthma    Diabetes mellitus type I (HCC)    Diabetes mellitus without complication (HCC)    DKA (diabetic ketoacidosis) (HCC) 05/18/2022   History of gallstones    Narcolepsy    Seizures (HCC)     PAST SURGICAL HISTORY: Past Surgical History:  Procedure Laterality Date   CIRCUMCISION     HERNIA REPAIR     TONSILLECTOMY      ALLERGIES: Allergies  Allergen Reactions   Robitussin [Guaifenesin] Hives and Itching    FAMILY HISTORY:  Family History  Problem Relation Age of Onset   Diabetes Mother    Suicidality Mother    Depression Father    Diabetes Maternal Uncle     SOCIAL HISTORY: Social History   Socioeconomic History   Marital status: Single    Spouse name: Not on file   Number of children: 2   Years of education: Not on file   Highest education level: Associate degree: occupational, Scientist, product/process development, or vocational program  Occupational History   Occupation: Barbershop  Tobacco Use   Smoking status: Every Day    Current packs/day: 0.50    Types: Cigarettes   Smokeless tobacco: Never  Vaping Use   Vaping status: Never Used  Substance and Sexual Activity   Alcohol use: No    Comment: occasionallly   Drug use: No   Sexual activity: Yes    Partners: Female    Birth control/protection: Condom    Comment: partner also use depo  Other Topics Concern   Not on file  Social History Narrative   Not on file   Social Drivers of Health   Financial Resource Strain: High Risk (10/21/2023)   Overall Financial Resource Strain (CARDIA)    Difficulty of Paying Living Expenses: Very hard  Food Insecurity: No Food Insecurity (09/20/2023)   Hunger Vital Sign    Worried About Running Out of Food in the Last Year: Never true    Ran Out of Food in the Last Year: Never true  Recent Concern: Food Insecurity - Food Insecurity Present (06/30/2023)   Received from [facility]   Hunger Vital Sign    Within the past 12 months, you worried that your food would run out before you got the money to buy more.: Often true    Within the past 12 months, the food you bought just didn't last and you didn't have money to get more.: Often true  Transportation Needs: Unmet Transportation Needs (10/21/2023)   PRAPARE - Transportation    Lack of Transportation (Medical): Yes    Lack of Transportation (Non-Medical): Yes  Physical Activity: Inactive (10/21/2023)   Exercise Vital Sign  Days of Exercise per Week: 0 days    Minutes of Exercise per Session: 0 min  Stress: Stress Concern Present (10/21/2023)   [employer] of Occupational Health - Occupational Stress Questionnaire    Feeling of Stress : Very much  Social Connections: Moderately Isolated (10/21/2023)   Social Connection and Isolation Panel    Frequency of Communication with Friends and Family: Three times a week    Frequency of Social Gatherings with Friends and Family: Once a week    Attends Religious Services: 1 to 4 times per year    Active Member of [employer] or Organizations: No    Attends Banker Meetings: Never    Marital Status: Never married    MEDICATIONS:  Current Outpatient Medications  Medication Sig Dispense Refill   albuterol  (VENTOLIN  HFA) 108 (90 Base) MCG/ACT inhaler Inhale 2 puffs into the lungs every 6 (six) hours as needed for wheezing or shortness of breath. 8 g 2   Continuous Glucose Sensor (DEXCOM G7 SENSOR) MISC use as directed for continuous glucose monitoring 9 each 3   DULoxetine  (CYMBALTA ) 30 MG capsule Take 1 capsule (30 mg total) by mouth daily. 30 capsule 2   gabapentin  (NEURONTIN ) 300 MG capsule Take 1 capsule (300 mg total) by mouth 3 (three) times daily. 90 capsule 5   hydrOXYzine  (ATARAX ) 25 MG tablet Take 1-2 tablets (25-50 mg total) by mouth 2 (two) times daily as needed for anxiety (sleep). 60 tablet 2   insulin  aspart (NOVOLOG ) 100  UNIT/ML injection Use 60 Units daily via insulin  pump. 20 mL 1   Insulin  Pen Needle (PEN NEEDLES 3/16) 31G X 5 MM MISC Use 4 times a day 300 each 4   Insulin  Syringe-Needle U-100 (INSULIN  SYRINGE 1CC/31GX5/16) 31G X 5/16 1 ML MISC Use as directed daily. 100 each 3   Insulin  Disposable Pump (OMNIPOD 5 DEXG7G6 INTRO GEN 5) KIT Use as directed. (Patient not taking: Reported on 04/17/2024) 1 kit 0   Insulin  Disposable Pump (OMNIPOD 5 DEXG7G6 PODS GEN 5) MISC use as directed change every 3 (three) days. (Patient not taking: Reported on 04/17/2024) 30 each 3   No current facility-administered medications for this visit.    PHYSICAL EXAM: Vitals:   04/17/24 0903  BP: 124/82  Pulse: (!) 121  Resp: 20  SpO2: 99%  Weight: 142 lb 3.2 oz (64.5 kg)  Height: 5' 11 (1.803 m)       Body mass index is 19.83 kg/m.  Wt Readings from Last 3 Encounters:  04/17/24 142 lb 3.2 oz (64.5 kg)  09/23/23 149 lb 3.2 oz (67.7 kg)  06/15/23 156 lb (70.8 kg)    General: Well developed, well nourished male in no apparent distress.  HEENT: AT/[place], no external lesions.  Eyes: Conjunctiva clear and no icterus. Neck: Neck supple  Lungs: Respirations not labored Neurologic: Alert, oriented, normal speech Extremities / Skin: Dry.  Psychiatric: Does not appear depressed or anxious  Diabetic Foot Exam - Simple   No data filed    LABS Reviewed Lab Results  Component Value Date   HGBA1C 13.3 (A) 04/17/2024   HGBA1C 12.6 (A) 09/23/2023   HGBA1C 14.3 (A) 04/29/2023   No results found for: FRUCTOSAMINE Lab Results  Component Value Date   CHOL 132 08/28/2020   HDL 37 (L) 08/28/2020   LDLCALC 82 08/28/2020   TRIG 63 08/28/2020   CHOLHDL 3.6 08/28/2020   Lab Results  Component Value Date   MICRALBCREAT 81 (H) 02/18/2023  MICRALBCREAT 4 11/26/2020   Lab Results  Component Value Date   CREATININE 0.81 09/21/2023   Lab Results  Component Value Date   GFR 110.92 03/04/2023    ASSESSMENT /  PLAN  1. 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   Lab Results  Component Value Date   HGBA1C 13.3 (A) 04/17/2024    - Hemoglobin A1c goal : <6.5%  Patient has been off of insulin  pump for several weeks.  He reports he has pump supplies and he also has Dexcom G7 supplies now.  Discussed in detail that pump therapy would be a optimal treatment for him.  Discussed, recommended and he agreed to restart insulin  pump.  Discussed in detail about compliance with diabetes regimen insulin  pump for improvement on diabetes control.  - Medications:   Resume OmniPod 5 with DeXCOM G7, using Novolog  U100.  Pump setting: Current setting, no change.  He has not been using pump for several weeks, not downloaded and reviewed in the clinic today.    Basal rate: 12 AM - 1.0. 7.0AM - 1.2 10 AM -  1.0  Correction factor : 1:45  Carb ratio : 1:1  Advised to use carb count as 5 for a small meal, 10 for medium meal and 15 for large meal.  Advised to use bolus program from the pump, that will take into account of blood sugar level prior to eating.  Advised to bolus for all the meals.  Advised to use automatic mode as much as possible.  Active insulin  time : 4 hr  Patient is asked to come back to the clinic in 10 days to download Dexcom and pump data to review.  Will adjust pump setting after able to review.  - Home glucose testing: Dexcom G7 and check as needed.   - Discussed/ Gave Hypoglycemia treatment plan.  # Consult : not required at this time.   # Annual urine for microalbuminuria/ creatinine ratio.  Positive microalbuminuria.  Will continue to monitor and recheck in the future visit. Last  Lab Results  Component Value Date   MICRALBCREAT 81 (H) 02/18/2023    # Foot check nightly / neuropathy, continue gabapentin .  # Annual dilated  diabetic eye exams, will refer ophthalmology in the follow-up visit.  - Diet: Make healthy diabetic food choices - Life style / activity / exercise: Discussed.  2. Blood pressure  -  BP Readings from Last 1 Encounters:  04/17/24 124/82    - Control is in target.  - No change in current plans.  3. 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     All questions answered and patient verbalized understanding of the plan.  Jerome Leauna Sharber, MD [facility] Endocrinology [facility] [address] [place], [address] [place], [place] [address] Phone # [phone]  At least part of this note was generated using voice recognition software. Inadvertent word errors may have occurred, which were not recognized during the proofreading process.

## 2024-04-17 NOTE — Patient Instructions (Signed)
 Come in 10 days to download [facility] and pump data, end of next week.

## 2024-04-19 ENCOUNTER — Encounter: Payer: Self-pay | Admitting: Endocrinology

## 2024-04-21 ENCOUNTER — Other Ambulatory Visit: Payer: Self-pay

## 2024-05-22 ENCOUNTER — Other Ambulatory Visit: Payer: Self-pay

## 2024-05-22 ENCOUNTER — Other Ambulatory Visit (HOSPITAL_COMMUNITY): Payer: Self-pay

## 2024-05-22 ENCOUNTER — Other Ambulatory Visit: Payer: Self-pay | Admitting: Endocrinology

## 2024-05-22 ENCOUNTER — Other Ambulatory Visit (HOSPITAL_COMMUNITY): Payer: Self-pay | Admitting: Psychiatry

## 2024-05-22 MED ORDER — "INSULIN SYRINGE 31G X 5/16"" 1 ML MISC"
1.0000 | Freq: Every day | 3 refills | Status: AC
  Filled 2024-05-22 – 2024-07-04 (×2): qty 100, 34d supply, fill #0

## 2024-05-23 ENCOUNTER — Other Ambulatory Visit (HOSPITAL_COMMUNITY): Payer: Self-pay

## 2024-05-29 ENCOUNTER — Ambulatory Visit: Admitting: Endocrinology

## 2024-06-02 ENCOUNTER — Other Ambulatory Visit (HOSPITAL_COMMUNITY): Payer: Self-pay

## 2024-06-13 ENCOUNTER — Other Ambulatory Visit: Payer: Self-pay | Admitting: Endocrinology

## 2024-06-13 ENCOUNTER — Other Ambulatory Visit (HOSPITAL_COMMUNITY): Payer: Self-pay

## 2024-06-13 DIAGNOSIS — R809 Proteinuria, unspecified: Secondary | ICD-10-CM

## 2024-06-13 MED ORDER — INSULIN ASPART 100 UNIT/ML IJ SOLN
60.0000 [IU] | Freq: Every day | INTRAMUSCULAR | 1 refills | Status: AC
  Filled 2024-06-13 – 2024-06-15 (×2): qty 20, 33d supply, fill #0
  Filled 2024-06-16: qty 10, 16d supply, fill #0
  Filled 2024-06-16: qty 10, 17d supply, fill #0
  Filled 2024-06-16: qty 20, 33d supply, fill #0
  Filled 2024-07-04: qty 20, 33d supply, fill #1
  Filled 2024-08-10 (×2): qty 10, 16d supply, fill #0

## 2024-06-15 ENCOUNTER — Other Ambulatory Visit (HOSPITAL_COMMUNITY): Payer: Self-pay

## 2024-06-15 ENCOUNTER — Other Ambulatory Visit (HOSPITAL_BASED_OUTPATIENT_CLINIC_OR_DEPARTMENT_OTHER): Payer: Self-pay

## 2024-06-15 ENCOUNTER — Other Ambulatory Visit: Payer: Self-pay

## 2024-06-16 ENCOUNTER — Other Ambulatory Visit: Payer: Self-pay

## 2024-06-16 ENCOUNTER — Other Ambulatory Visit (HOSPITAL_COMMUNITY): Payer: Self-pay

## 2024-06-28 ENCOUNTER — Other Ambulatory Visit (HOSPITAL_COMMUNITY): Payer: Self-pay

## 2024-07-03 ENCOUNTER — Other Ambulatory Visit (HOSPITAL_COMMUNITY): Payer: Self-pay

## 2024-07-04 ENCOUNTER — Other Ambulatory Visit (HOSPITAL_COMMUNITY): Payer: Self-pay

## 2024-07-04 ENCOUNTER — Other Ambulatory Visit: Payer: Self-pay

## 2024-07-04 ENCOUNTER — Other Ambulatory Visit: Payer: Self-pay | Admitting: Endocrinology

## 2024-07-04 MED ORDER — DEXCOM G7 SENSOR MISC
1.0000 | 3 refills | Status: AC
  Filled 2024-07-04: qty 3, 30d supply, fill #0

## 2024-07-07 ENCOUNTER — Other Ambulatory Visit (HOSPITAL_COMMUNITY): Payer: Self-pay

## 2024-08-10 ENCOUNTER — Other Ambulatory Visit: Payer: Self-pay

## 2024-08-10 ENCOUNTER — Other Ambulatory Visit (HOSPITAL_COMMUNITY): Payer: Self-pay

## 2024-08-11 ENCOUNTER — Other Ambulatory Visit (HOSPITAL_COMMUNITY): Payer: Self-pay

## 2024-08-11 ENCOUNTER — Other Ambulatory Visit: Payer: Self-pay | Admitting: Internal Medicine

## 2024-08-11 DIAGNOSIS — E1029 Type 1 diabetes mellitus with other diabetic kidney complication: Secondary | ICD-10-CM
